# Patient Record
Sex: Male | Born: 1985 | Race: White | Hispanic: No | Marital: Married | Smoking: Former smoker
Health system: Southern US, Community
[De-identification: ages and names within clinical notes are randomized; demographics above are authoritative.]

## PROBLEM LIST (undated history)

## (undated) DIAGNOSIS — N200 Calculus of kidney: Secondary | ICD-10-CM

## (undated) DIAGNOSIS — T7840XA Allergy, unspecified, initial encounter: Secondary | ICD-10-CM

## (undated) DIAGNOSIS — R5383 Other fatigue: Secondary | ICD-10-CM

## (undated) DIAGNOSIS — K219 Gastro-esophageal reflux disease without esophagitis: Secondary | ICD-10-CM

## (undated) DIAGNOSIS — F32A Depression, unspecified: Secondary | ICD-10-CM

## (undated) DIAGNOSIS — Z9109 Other allergy status, other than to drugs and biological substances: Secondary | ICD-10-CM

## (undated) DIAGNOSIS — K5289 Other specified noninfective gastroenteritis and colitis: Secondary | ICD-10-CM

## (undated) DIAGNOSIS — R5381 Other malaise: Secondary | ICD-10-CM

## (undated) DIAGNOSIS — F17200 Nicotine dependence, unspecified, uncomplicated: Secondary | ICD-10-CM

## (undated) DIAGNOSIS — F329 Major depressive disorder, single episode, unspecified: Secondary | ICD-10-CM

## (undated) DIAGNOSIS — I10 Essential (primary) hypertension: Secondary | ICD-10-CM

## (undated) DIAGNOSIS — F410 Panic disorder [episodic paroxysmal anxiety] without agoraphobia: Secondary | ICD-10-CM

## (undated) DIAGNOSIS — G4730 Sleep apnea, unspecified: Secondary | ICD-10-CM

## (undated) HISTORY — DX: Nicotine dependence, unspecified, uncomplicated: F17.200

## (undated) HISTORY — DX: Major depressive disorder, single episode, unspecified: F32.9

## (undated) HISTORY — DX: Calculus of kidney: N20.0

## (undated) HISTORY — PX: OTHER SURGICAL HISTORY: SHX169

## (undated) HISTORY — DX: Other specified noninfective gastroenteritis and colitis: K52.89

## (undated) HISTORY — DX: Allergy, unspecified, initial encounter: T78.40XA

## (undated) HISTORY — DX: Other fatigue: R53.83

## (undated) HISTORY — DX: Depression, unspecified: F32.A

## (undated) HISTORY — DX: Sleep apnea, unspecified: G47.30

## (undated) HISTORY — DX: Other malaise: R53.81

---
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
## 2004-06-05 ENCOUNTER — Emergency Department (HOSPITAL_COMMUNITY): Admission: EM | Admit: 2004-06-05 | Discharge: 2004-06-05 | Payer: Self-pay | Admitting: Emergency Medicine

## 2004-11-12 ENCOUNTER — Ambulatory Visit: Payer: Self-pay | Admitting: Family Medicine

## 2005-07-21 ENCOUNTER — Ambulatory Visit: Payer: Self-pay | Admitting: Family Medicine

## 2005-07-29 ENCOUNTER — Ambulatory Visit: Payer: Self-pay | Admitting: Family Medicine

## 2005-10-23 ENCOUNTER — Ambulatory Visit: Payer: Self-pay | Admitting: Family Medicine

## 2006-04-06 ENCOUNTER — Ambulatory Visit: Payer: Self-pay | Admitting: Family Medicine

## 2007-04-27 ENCOUNTER — Ambulatory Visit: Payer: Self-pay | Admitting: Family Medicine

## 2007-04-27 DIAGNOSIS — R4589 Other symptoms and signs involving emotional state: Secondary | ICD-10-CM | POA: Insufficient documentation

## 2007-04-27 DIAGNOSIS — F329 Major depressive disorder, single episode, unspecified: Secondary | ICD-10-CM

## 2007-05-31 ENCOUNTER — Ambulatory Visit: Payer: Self-pay | Admitting: Internal Medicine

## 2007-09-06 ENCOUNTER — Ambulatory Visit: Payer: Self-pay | Admitting: Family Medicine

## 2007-12-21 ENCOUNTER — Ambulatory Visit: Payer: Self-pay | Admitting: Family Medicine

## 2008-01-24 ENCOUNTER — Ambulatory Visit: Payer: Self-pay | Admitting: Family Medicine

## 2008-01-25 ENCOUNTER — Encounter (INDEPENDENT_AMBULATORY_CARE_PROVIDER_SITE_OTHER): Payer: Self-pay | Admitting: Internal Medicine

## 2008-01-25 ENCOUNTER — Emergency Department (HOSPITAL_COMMUNITY): Admission: EM | Admit: 2008-01-25 | Discharge: 2008-01-25 | Payer: Self-pay | Admitting: Emergency Medicine

## 2008-01-26 ENCOUNTER — Telehealth (INDEPENDENT_AMBULATORY_CARE_PROVIDER_SITE_OTHER): Payer: Self-pay | Admitting: Internal Medicine

## 2008-02-02 ENCOUNTER — Ambulatory Visit: Payer: Self-pay | Admitting: Family Medicine

## 2008-02-13 ENCOUNTER — Ambulatory Visit: Payer: Self-pay | Admitting: Family Medicine

## 2008-02-13 DIAGNOSIS — J309 Allergic rhinitis, unspecified: Secondary | ICD-10-CM

## 2008-03-15 ENCOUNTER — Ambulatory Visit: Payer: Self-pay | Admitting: Pulmonary Disease

## 2008-03-15 ENCOUNTER — Ambulatory Visit: Payer: Self-pay | Admitting: Family Medicine

## 2008-03-15 ENCOUNTER — Telehealth (INDEPENDENT_AMBULATORY_CARE_PROVIDER_SITE_OTHER): Payer: Self-pay | Admitting: Internal Medicine

## 2008-03-15 DIAGNOSIS — G4710 Hypersomnia, unspecified: Secondary | ICD-10-CM | POA: Insufficient documentation

## 2008-03-18 ENCOUNTER — Ambulatory Visit (HOSPITAL_BASED_OUTPATIENT_CLINIC_OR_DEPARTMENT_OTHER): Admission: RE | Admit: 2008-03-18 | Discharge: 2008-03-18 | Payer: Self-pay | Admitting: Pulmonary Disease

## 2008-03-23 ENCOUNTER — Telehealth: Payer: Self-pay | Admitting: Pulmonary Disease

## 2008-03-23 ENCOUNTER — Ambulatory Visit: Payer: Self-pay | Admitting: Pulmonary Disease

## 2008-03-29 ENCOUNTER — Encounter (INDEPENDENT_AMBULATORY_CARE_PROVIDER_SITE_OTHER): Payer: Self-pay | Admitting: Internal Medicine

## 2008-04-03 ENCOUNTER — Ambulatory Visit (HOSPITAL_BASED_OUTPATIENT_CLINIC_OR_DEPARTMENT_OTHER): Admission: RE | Admit: 2008-04-03 | Discharge: 2008-04-03 | Payer: Self-pay | Admitting: Pulmonary Disease

## 2008-04-05 ENCOUNTER — Ambulatory Visit: Payer: Self-pay | Admitting: Pulmonary Disease

## 2008-04-12 ENCOUNTER — Ambulatory Visit: Payer: Self-pay | Admitting: Pulmonary Disease

## 2008-07-31 ENCOUNTER — Ambulatory Visit: Payer: Self-pay | Admitting: Family Medicine

## 2008-07-31 DIAGNOSIS — J111 Influenza due to unidentified influenza virus with other respiratory manifestations: Secondary | ICD-10-CM | POA: Insufficient documentation

## 2008-10-03 ENCOUNTER — Ambulatory Visit: Payer: Self-pay | Admitting: Family Medicine

## 2008-10-03 LAB — CONVERTED CEMR LAB: Rapid Strep: NEGATIVE

## 2008-11-14 ENCOUNTER — Telehealth (INDEPENDENT_AMBULATORY_CARE_PROVIDER_SITE_OTHER): Payer: Self-pay | Admitting: Internal Medicine

## 2010-03-12 ENCOUNTER — Ambulatory Visit: Payer: Self-pay | Admitting: Internal Medicine

## 2010-03-12 ENCOUNTER — Ambulatory Visit: Payer: Self-pay | Admitting: Family Medicine

## 2010-03-12 ENCOUNTER — Telehealth: Payer: Self-pay | Admitting: Family Medicine

## 2010-03-12 DIAGNOSIS — R109 Unspecified abdominal pain: Secondary | ICD-10-CM

## 2010-03-12 DIAGNOSIS — R3129 Other microscopic hematuria: Secondary | ICD-10-CM | POA: Insufficient documentation

## 2010-03-12 DIAGNOSIS — Z87442 Personal history of urinary calculi: Secondary | ICD-10-CM

## 2010-03-12 LAB — CONVERTED CEMR LAB
Bilirubin Urine: NEGATIVE
Casts: 0 /lpf
Glucose, Urine, Semiquant: NEGATIVE
Ketones, urine, test strip: NEGATIVE
Nitrite: NEGATIVE
Specific Gravity, Urine: 1.02
Urine crystals, microscopic: 0 /hpf
Urobilinogen, UA: 0.2
Yeast, UA: 0
pH: 5.5

## 2010-03-13 ENCOUNTER — Encounter: Payer: Self-pay | Admitting: Family Medicine

## 2010-03-27 ENCOUNTER — Encounter: Payer: Self-pay | Admitting: Family Medicine

## 2010-04-10 ENCOUNTER — Encounter: Payer: Self-pay | Admitting: Family Medicine

## 2010-10-20 ENCOUNTER — Emergency Department (HOSPITAL_COMMUNITY)
Admission: EM | Admit: 2010-10-20 | Discharge: 2010-10-20 | Payer: Self-pay | Source: Home / Self Care | Admitting: Family Medicine

## 2010-11-18 NOTE — Progress Notes (Signed)
Summary: Called CT report  Phone Note From Other Clinic   Caller: Rose from CT Call For: Dr Milinda Antis Summary of Call: Pt has 4ml stone in left ureter. Pt is going home and drinking alot of water as instructed.Please advise.  Initial call taken by: Lewanda Rife LPN,  Mar 12, 2010 2:24 PM  Follow-up for Phone Call        please let pt know he does have a kidney stone -- is on the L interestingly -- as most of his pain was on the R -- but I still think this is causing his back pain  is in proximal (early) ureter-- so has a way to go to pass it  use urine screen and update me if he catches anything I am going to go ahead and ref to a urologist as well will do ref and route to [facility] Follow-up by: Judith Part MD,  Mar 12, 2010 2:45 PM  Additional Follow-up for Phone Call Additional follow up Details #1::        Patient notified as instructed by telephone. Pt has already spoken with Shirlee Limerick and has appt in AM with urologist.[facility] LPN  Mar 12, 2010 5:11 PM   New Problems: RENAL CALCULUS (ICD-592.0)   New Problems: RENAL CALCULUS (ICD-592.0)

## 2010-11-18 NOTE — Assessment & Plan Note (Signed)
Summary: DEPRESSION/CLE  Medications Added ALLEGRA-D 12 HOUR 60-120 MG TB12 (FEXOFENADINE-PSEUDOEPHEDRINE)  PROZAC 20 MG  CAPS (FLUOXETINE HCL) 1 qd      Allergies Added: NKDA  Vital Signs:  Patient Profile:   25 Years Old Male Weight:      153 pounds Temp:     98.6 degrees F oral Pulse rate:   83 / minute BP sitting:   122 / 97  (left arm) Cuff size:   regular  Vitals Entered By: Cooper Render (April 27, 2007 12:19 PM)               Chief Complaint:  Depression.  History of Present Illness: Here withmother because of an acute depression.  He and girlfriend broke up 7/6 and he has been depressesd since, indicates he has been depressed for2+ mo.  Has been in bed mosto f the time, not eating and not sleeping.  Has no suicidal or homocidal ideation.  Has not been lto work this wk and is in Product/process development scientist of loosing job.  Mother called his work this AM.  Is in agreement to go to [facility] for eval for their out patient program forl depression.  No previous hx of depression.  Current Allergies: No known allergies      Review of Systems      See HPI   Physical Exam  General:     alert, well-developed, well-nourished, and well-hydrated.  NAD Head:     normocephalic.   Eyes:     no injection.   Lungs:     normal respiratory effort.   Neurologic:     alert & oriented X3 and gait normal.   Skin:     turgor normal and color normal.   Psych:     normally interactive, not anxious appearing, depressed affect, and flat affect.      Impression & Recommendations:  Problem # 1:  DEPRESSION (ICD-311) Assessment: Unchanged  His updated medication list for this problem includes:    Prozac 20 Mg Caps (Fluoxetine hcl) .Marland Kitchen... 1 once daily   Anticipate will need 3-40mo only Referred to [facility] for eval for theit OP depression program See back 1 mo  Medications Added to Medication List This Visit: 1)  Allegra-d 12 Hour 60-120 Mg Tb12  (Fexofenadine-pseudoephedrine) 2)  Prozac 20 Mg Caps (Fluoxetine hcl) .Marland Kitchen.. 1 qd   Patient Instructions: 1)  Please schedule a follow-up appointment in 1 month. 2)  Reviewed plan with patient and mo--agree    Prescriptions: PROZAC 20 MG  CAPS (FLUOXETINE HCL) 1 qd  #30 x 1   Entered and Authorized by:   Gildardo Griffes FNP   Signed by:   Gildardo Griffes FNP on 04/27/2007   Method used:   Print then Give to Patient   RxID:   [id number]

## 2010-11-18 NOTE — Assessment & Plan Note (Signed)
Summary: BACK PAIN SEVERE ACUTE ONLY/DLO   Vital Signs:  Patient profile:   25 year old male Height:      68 inches Weight:      192 pounds BMI:     29.30 Temp:     98.4 degrees F oral Pulse rate:   80 / minute Pulse rhythm:   regular BP sitting:   128 / 84  (left arm) Cuff size:   regular  Vitals Entered By: Lewanda Rife LPN (Mar 12, 2010 11:52 AM) CC: on 03/10/10 severe lower back pain on rt side started. last night pain went down both legs. Pain scale now 5.   History of Present Illness: pain in R side of low back radiates to flank rad to legs last night   no position makes it better or worse  was uncomfortale  no blood in urine   pain is 5/10 right now  was 10/10 this am  took advil for a headache earlier    no hx of kidney stones  does not drink enough water in general - sweats a lot at work   no injury known no hx of back pain  no nausea    Allergies: 1)  ! Chantix Starting Month Pak (Varenicline Tartrate)  Past History:  Past Medical History: Last updated: 03/15/2008  Current Problems:  GASTROENTERITIS (ICD-558.9) SLEEP APNEA (ICD-780.57) ALLERGIC RHINITIS (ICD-477.9) WEAKNESS (ICD-780.79) TOBACCO ABUSE (ICD-305.1) DEPRESSION (ICD-311)  Past Surgical History: Last updated: 03/15/2008 none  Family History: Last updated: 03/15/2008 Grandmother - heart disease, diabetes Mother - asthma  Social History: Last updated: 03/15/2008 Mom smokes.  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  Risk Factors: Alcohol Use: 1 (10/03/2008) Caffeine Use: 5+ (10/03/2008) Exercise: no (10/03/2008)  Risk Factors: Smoking Status: quit (03/15/2008) Packs/Day: 1+ (12/21/2007) Passive Smoke Exposure: yes (03/15/2008)  Review of Systems General:  Complains of fatigue; denies chills, fever, loss of appetite, and malaise. Eyes:  Denies blurring. CV:  Denies chest pain or discomfort, palpitations, and shortness of breath with exertion. Resp:  Denies  cough, shortness of breath, and wheezing. GI:  Complains of abdominal pain; denies bloody stools, change in bowel habits, indigestion, and vomiting. GU:  Denies hematuria and urinary frequency; did have darker urine than usual last week. MS:  Complains of low back pain and mid back pain. Derm:  Denies lesion(s), poor wound healing, and rash. Neuro:  Denies numbness, tingling, and weakness. Psych:  Denies anxiety and depression. Heme:  Denies abnormal bruising and bleeding.  Physical Exam  General:  uncomfortable appearing male  Head:  normocephalic, atraumatic, and no abnormalities observed.   Eyes:  vision grossly intact, pupils equal, pupils round, and pupils reactive to light.  no conj pallor or icterus Mouth:  pharynx pink and moist.   Neck:  No deformities, masses, or tenderness noted. Lungs:  Normal respiratory effort, chest expands symmetrically. Lungs are clear to auscultation, no crackles or wheezes. Heart:  Normal rate and regular rhythm. S1 and S2 normal without gallop, murmur, click, rub or other extra sounds. Abdomen:  tender in R pelvic and suprapubic areas and flank no rebound or gaurding soft, normal bowel sounds, no distention, and no masses.   Msk:  tender R cva  tender R flank some lower LS tenderness neg slr rot R hip worsens pain  LS flex 90 deg/ ext 10 deg- some discomfort  Extremities:  No clubbing, cyanosis, edema, or deformity noted with normal full range of motion of all joints.  Neurologic:  strength normal in all extremities, gait normal, and DTRs symmetrical and normal.   Skin:  Intact without suspicious lesions or rashes Cervical Nodes:  No lymphadenopathy noted Inguinal Nodes:  No significant adenopathy Psych:  normal affect, talkative and pleasant    Impression & Recommendations:  Problem # 1:  FLANK PAIN, RIGHT (ICD-789.09) Assessment New severe flank pain with substantial micro hematuria  stongly suspect kidney stone  sent for Ct without  contrast  urine screen  vicodin for pain  handout on kidney stones from [facility] update asap if worse or prob urinating cx urine  His updated medication list for this problem includes:    Vicodin 5-500 Mg Tabs (Hydrocodone-acetaminophen) .Marland Kitchen... 1-2 by mouth up to every 4-6 hours as needed severe pain caution of sedation  Orders: T-Culture, Urine ([id number]) Specimen Handling (14782) Radiology Referral (Radiology) UA Dipstick W/ Micro (manual) (81000)  Complete Medication List: 1)  Albuterol 90 Mcg/act Aers (Albuterol) .... As needed 2)  Nasonex 50 Mcg/act Susp (Mometasone furoate) .... As directed 3)  Allegra-d 12 Hour 60-120 Mg Xr12h-tab (Fexofenadine-pseudoephedrine) .Marland Kitchen.. 1 two times a day as needed congestion/allergies by mouth 4)  Vicodin 5-500 Mg Tabs (Hydrocodone-acetaminophen) .Marland Kitchen.. 1-2 by mouth up to every 4-6 hours as needed severe pain caution of sedation  Patient Instructions: 1)  you may have a kidney stone  2)  we will send you for CT scan at check out  3)  please urinate through urine screen  4)  I am also sending your urine for culture  5)  drink lots of water  6)  update me if pain worsens  7)  use vicodin for pain with caution -- it can make you sleepy or loopy  8)  I will update you when results come back  Prescriptions: VICODIN 5-500 MG TABS (HYDROCODONE-ACETAMINOPHEN) 1-2 by mouth up to every 4-6 hours as needed severe pain caution of sedation  #20 x 0   Entered and Authorized by:   Judith Part MD   Signed by:   Judith Part MD on 03/12/2010   Method used:   Print then Give to Patient   RxID:   [id number]   Current Allergies (reviewed today): ! CHANTIX STARTING MONTH PAK (VARENICLINE TARTRATE)  Laboratory Results   Urine Tests  Date/Time Received: Mar 12, 2010 11:54 AM  Date/Time Reported: Mar 12, 2010 11:54 AM   Routine Urinalysis   Color: straw Appearance: Cloudy Glucose: negative   (Normal Range: Negative) Bilirubin: negative    (Normal Range: Negative) Ketone: negative   (Normal Range: Negative) Spec. Gravity: 1.020   (Normal Range: 1.003-1.035) Blood: large   (Normal Range: Negative) pH: 5.5   (Normal Range: 5.0-8.0) Protein: trace   (Normal Range: Negative) Urobilinogen: 0.2   (Normal Range: 0-1) Nitrite: negative   (Normal Range: Negative) Leukocyte Esterace: trace   (Normal Range: Negative)  Urine Microscopic WBC/HPF: 0-1 RBC/HPF: TMT Bacteria/HPF: few Mucous/HPF: few Epithelial/HPF: 0-1 Crystals/HPF: 0 Casts/LPF: 0 Yeast/HPF: 0 Other: 0

## 2010-11-18 NOTE — Letter (Signed)
Summary: Out of Work  [employer] at [facility]  [address] [place], [place] [address]   Phone: [phone]  Fax: [phone]    Mar 12, 2010   Employee:  Donald Merritt    To Whom It May Concern:   For Medical reasons, please excuse the above named employee from work for the following dates:  Start:   03/10/2010  End:   03/14/2010 if he is feeling better   If you need additional information, please feel free to contact our office.         Sincerely,    Judith Part MD

## 2010-11-18 NOTE — Letter (Signed)
Summary: [facility]   Imported By: Lanelle Bal 04/17/2010 10:43:41  _____________________________________________________________________  External Attachment:    Type:   Image     Comment:   External Document

## 2010-11-18 NOTE — Consult Note (Signed)
Summary: [facility]  [facility]   Imported By: Lanelle Bal 03/24/2010 08:31:21  _____________________________________________________________________  External Attachment:    Type:   Image     Comment:   External Document

## 2010-11-18 NOTE — Consult Note (Signed)
Summary: [facility]  [facility]   Imported By: Lanelle Bal 04/03/2010 13:16:43  _____________________________________________________________________  External Attachment:    Type:   Image     Comment:   External Document

## 2010-12-29 LAB — POCT RAPID STREP A (OFFICE): Streptococcus, Group A Screen (Direct): NEGATIVE

## 2011-03-03 NOTE — Procedures (Signed)
NAME:  Donald Merritt, Donald Merritt               ACCOUNT NO.:  [id number]   MEDICAL RECORD NO.:  [id number]          PATIENT TYPE:  OUT   LOCATION:  SLEEP CENTER                 FACILITY:  [facility]   PHYSICIAN:  Oretha Milch, MD      DATE OF BIRTH:  Feb 12, 1986   DATE OF STUDY:                            NOCTURNAL POLYSOMNOGRAM   REFERRING PHYSICIAN:   INDICATION FOR STUDY:  Excessive daytime somnolence in this 25 year old  gentleman with BMI 27, weight 180 pounds, height 5 feet 9 inches, neck  size 15 inches.  He did not describe any history of cataplexy, sleep  paralysis or parasomnias.   Overnight polysomnogram was performed with the sleep technologist in  attendance.  EEG, EOG, EMG, EKG and respiratory parameters were  recorded.  Sleep stages, arousals and respiratory data were recorded  according to criteria laid out by the American Academy of Sleep  Medicine.   EPWORTH SLEEPINESS SCORE:  11/24.   SLEEP ARCHITECTURE:  Lights out was at 9:15 p.m.  Total sleep time was  322 minutes with a sleep period time of 356 minutes leading to a sleep  efficiency of 70.2%.  Sleep latency was 102.5 minutes with awake after  sleep onset of 34 minutes.  Latency to REM sleep was 191 minutes.  Sleep  stages of the person is a total sleep time was N1 5%, N2 66.8%, N3 8.7%,  and REM sleep 19.6% (63 minutes).  He spent 121 minutes in the supine  position in non-REM sleep.  No supine REM sleep was noted.  Good REM  progression was observed with the longest period of REM sleep around 4  a.m.   AROUSAL DATA:  There were a total of 64 arousals with an arousal index  of 11.9 events per hour of these 62 were spontaneous and 2 were  associated with limb movement.   RESPIRATORY DATA:  There was 1 central apnea, zero obstructive apnea,  zero mixed apneas and zero hypopneas during the sleep period with an AHI  of 0.2 events per hour, 5 RERAs were observed with an LD of 1.1 events  per hour.  Central apnea lasted for 14  seconds.   LIMB MOVEMENT DATA:  No significant limb movements were observed.   OXYGEN SATURATION DATA:  There were no significant desaturations noted.  The lowest desaturation was 93% during non-REM sleep.   CARDIAC DATA:  The lowest heart rate was 43 beats per minute.  No  cardiac arrhythmias were noted.   DISCUSSION:  Prolonged sleep latency was noted.  This may have been  related to a phone call that the patient obtained prior to the study  telling him that his fiancee was in the hospital.  Good REM progression  was noted with a prolonged REM latency.   IMPRESSION:  1. No evidence of sleep disordered breathing.  2. No evidence of cardiac arrhythmias, limb movements or behavioral      disturbance during sleep.  3. The differential diagnosis of excessive daytime somnolence based on      this sleep study includes insufficient sleep syndrome and      narcolepsy.  The absence of  a history of cataplexy, sleep paralysis      and the prolonged latency to REM sleep does not favor narcolepsy.   RECOMMENDATIONS:  Proceed with multiple sleep latency testing for  objective evidence of hypersomnolence and to look for sleep onset REM.      Oretha Milch, MD  Electronically Signed     RVA/MEDQ  D:  03/23/2008 13:23:33  T:  03/23/2008 15:10:10  Job:  [id number]

## 2011-03-03 NOTE — Procedures (Signed)
NAME:  Donald Merritt, Donald Merritt NO.:  [id number]   MEDICAL RECORD NO.:  [id number]          PATIENT TYPE:  OUT   LOCATION:  SLEEP CENTER                 FACILITY:  [facility]   PHYSICIAN:  Oretha Milch, MD      DATE OF BIRTH:  1985-12-29   DATE OF STUDY:  04/03/2008                        MAINTENANCE OF WAKEFULNESS TEST   REFERRING PHYSICIAN:   INDICATION FOR STUDY:  [name] is a 25 year old gentleman with  excessive daytime somnolence and fatigue and a nondiagnostic overnight  polysomnogram.  He weighs 180 pounds with a BMI of 27, height 5 feet 9  inches.  Neck size 15 inches and Epworth sleepiness score 12/24.   This multiple sleep latency testing was performed with a sleep  technologist in attendance.  EEG, EOG, EMG, EKG and respiratory data was  recorded.  Sleep stages arousals were scored according to criteria laid  out by the American Academy of Sleep Medicine.   EPWORTH SLEEPINESS SCORE:  12/24.   BMI:  He weighs 180 pounds with a BMI of 27, height 5 feet 9 inches.  Neck size 15 inches.   MEDICATIONS:  none   SLEEP HISTORY:  His bedtime is 9 or 10 p.m..  Wake up time is 6:30 a.m.,  with a time in bed of 8-10 hours.  There is one to two awakenings and  describes a 15-30 minutes sleep latency.   Starting at 8 a.m. five naps were recorded.  Sleep was noted on naps #2  and 4.   NAP 1:  No sleep was obtained.   NAP 2:  Lights off was at 10:10 a.m., sleep latency was 7 minutes, no  REM sleep was obtained.   NAP 3:  No sleep was obtained.   NAP 4:  Lights off was at 1410 p.m., sleep latency was 9 minutes.   NAP 5:  No sleep was obtained.    MEAN SLEEP LATENCY:  15.2 mins   COMMENTS:   IMPRESSIONS-RECOMMENDATIONS:  1. Average sleep latency was 15.2 minutes.  2. Sleep was noted on 2/5 naps.  No sleep onset REM was noted.  This      MSLT is not diagnostic of narcolepsy.  In view of his excessive      daytime somnolence and overnight polysomnogram which did  not show      any evidence of sleep disordered breathing or periodic limb      movements and a sleep history that seems to indicate sufficient      sleep time (8-10 hours) we should consider etiopathic      hypersomnolence as a possible diagnosis.  I also note that he does      not provide any history of cataplexy or sleep paralysis.   RECOMMENDATIONS:  1. Consider prolonging sleep time to 9 hours every night.  2. If the somnolence is persistent and interfering with his work,      consider trial of Provigil.      Oretha Milch, MD  Electronically Signed     RVA/MEDQ  D:  04/05/2008 11:46:41  T:  04/05/2008 13:16:28  Job:  [id number]

## 2011-05-30 ENCOUNTER — Emergency Department (HOSPITAL_COMMUNITY)
Admission: EM | Admit: 2011-05-30 | Discharge: 2011-05-31 | Disposition: A | Discharge status: Home / Self Care | Payer: Self-pay | Attending: Emergency Medicine | Admitting: Emergency Medicine

## 2011-05-30 DIAGNOSIS — F329 Major depressive disorder, single episode, unspecified: Secondary | ICD-10-CM | POA: Insufficient documentation

## 2011-05-30 DIAGNOSIS — F3289 Other specified depressive episodes: Secondary | ICD-10-CM | POA: Insufficient documentation

## 2011-05-30 LAB — COMPREHENSIVE METABOLIC PANEL
ALT: 25 U/L (ref 0–53)
AST: 21 U/L (ref 0–37)
Albumin: 4.2 g/dL (ref 3.5–5.2)
Alkaline Phosphatase: 113 U/L (ref 39–117)
BUN: 12 mg/dL (ref 6–23)
CO2: 27 mEq/L (ref 19–32)
Chloride: 102 mEq/L (ref 96–112)
Creatinine, Ser: 0.95 mg/dL (ref 0.50–1.35)
GFR calc Af Amer: >60 mL/min (ref >60)
Glucose, Bld: 106 mg/dL — ABNORMAL HIGH (ref 70–99)
Potassium: 3.7 mEq/L (ref 3.5–5.1)
Sodium: 138 mEq/L (ref 135–145)
Total Bilirubin: 0.4 mg/dL (ref 0.3–1.2)

## 2011-05-30 LAB — RAPID URINE DRUG SCREEN, HOSP PERFORMED
Amphetamines: NOT DETECTED
Barbiturates: NOT DETECTED
Benzodiazepines: NOT DETECTED
Cocaine: NOT DETECTED
Opiates: NOT DETECTED
Tetrahydrocannabinol: NOT DETECTED

## 2011-05-30 LAB — DIFFERENTIAL
Basophils Relative: 0 % (ref 0–1)
Eosinophils Relative: 7 % — ABNORMAL HIGH (ref 0–5)
Lymphocytes Relative: 33 % (ref 12–46)
Monocytes Absolute: 0.6 10*3/uL (ref 0.1–1.0)
Monocytes Relative: 6 % (ref 3–12)
Neutro Abs: 5 10*3/uL (ref 1.7–7.7)
Neutrophils Relative %: 53 % (ref 43–77)

## 2011-05-30 LAB — CBC
HCT: 46.7 % (ref 39.0–52.0)
Hemoglobin: 16.6 g/dL (ref 13.0–17.0)
MCH: 29.9 pg (ref 26.0–34.0)
MCHC: 35.5 g/dL (ref 30.0–36.0)
MCV: 84 fL (ref 78.0–100.0)
Platelets: 219 10*3/uL (ref 150–400)
RDW: 12.5 % (ref 11.5–15.5)
WBC: 9.4 10*3/uL (ref 4.0–10.5)

## 2011-07-14 LAB — URINALYSIS, ROUTINE W REFLEX MICROSCOPIC
Bilirubin Urine: NEGATIVE
Glucose, UA: NEGATIVE
Hgb urine dipstick: NEGATIVE
Ketones, ur: NEGATIVE
Nitrite: NEGATIVE
Protein, ur: NEGATIVE
Specific Gravity, Urine: 1.006
Urobilinogen, UA: 0.2
pH: 7.5

## 2011-07-14 LAB — DIFFERENTIAL
Basophils Absolute: 0
Basophils Relative: 0
Eosinophils Absolute: 0.1
Eosinophils Relative: 0
Lymphocytes Relative: 9 — ABNORMAL LOW
Lymphs Abs: 1.5
Monocytes Absolute: 0.7
Monocytes Relative: 4
Neutro Abs: 13.7 — ABNORMAL HIGH
Neutrophils Relative %: 86 — ABNORMAL HIGH

## 2011-07-14 LAB — CBC
HCT: 47.1
Hemoglobin: 16.2
MCHC: 34.4
MCV: 86.8
Platelets: 203
RBC: 5.42
RDW: 12.4
WBC: 16 — ABNORMAL HIGH

## 2011-07-14 LAB — POCT CARDIAC MARKERS
CKMB, poc: <1 — ABNORMAL LOW
Myoglobin, poc: 43
Operator id: 294521
Troponin i, poc: <0.05

## 2011-07-14 LAB — POCT I-STAT, CHEM 8
BUN: 14
Calcium, Ion: 1.17
Chloride: 105
Creatinine, Ser: 1.2
Glucose, Bld: 97
HCT: 48
Hemoglobin: 16.3
Potassium: 4.7
Sodium: 139
TCO2: 26

## 2011-07-14 LAB — HEPATIC FUNCTION PANEL
ALT: 27
AST: 29
Albumin: 4.2
Alkaline Phosphatase: 88
Bilirubin, Direct: 0.2
Indirect Bilirubin: 0.5
Total Bilirubin: 0.7
Total Protein: 6.8

## 2011-07-14 LAB — LIPASE, BLOOD: Lipase: 20

## 2011-09-01 ENCOUNTER — Other Ambulatory Visit: Payer: Self-pay

## 2011-09-01 ENCOUNTER — Encounter: Payer: Self-pay | Admitting: *Deleted

## 2011-09-01 ENCOUNTER — Emergency Department (HOSPITAL_COMMUNITY)
Admission: EM | Admit: 2011-09-01 | Discharge: 2011-09-02 | Discharge status: Against Medical Advice | Payer: BC Managed Care – PPO | Attending: Emergency Medicine | Admitting: Emergency Medicine

## 2011-09-01 DIAGNOSIS — R079 Chest pain, unspecified: Secondary | ICD-10-CM | POA: Insufficient documentation

## 2011-09-01 HISTORY — DX: Panic disorder (episodic paroxysmal anxiety): F41.0

## 2011-09-01 HISTORY — DX: Other allergy status, other than to drugs and biological substances: Z91.09

## 2011-09-01 NOTE — ED Notes (Signed)
Quit smoking Saturday, has had head cold, taking mucinex, c/o chest fluttering, spaced out, "feel high", some nausea & sob. Onset of sx around 1600, last mucinex at 0600.

## 2011-09-02 ENCOUNTER — Encounter (HOSPITAL_COMMUNITY): Payer: Self-pay | Admitting: *Deleted

## 2011-09-02 ENCOUNTER — Emergency Department (HOSPITAL_COMMUNITY): Payer: BC Managed Care – PPO

## 2011-09-02 ENCOUNTER — Emergency Department (HOSPITAL_COMMUNITY)
Admission: EM | Admit: 2011-09-02 | Discharge: 2011-09-02 | Disposition: A | Discharge status: Home / Self Care | Payer: BC Managed Care – PPO | Attending: Emergency Medicine | Admitting: Emergency Medicine

## 2011-09-02 ENCOUNTER — Other Ambulatory Visit: Payer: Self-pay

## 2011-09-02 DIAGNOSIS — R079 Chest pain, unspecified: Secondary | ICD-10-CM | POA: Insufficient documentation

## 2011-09-02 DIAGNOSIS — Z7982 Long term (current) use of aspirin: Secondary | ICD-10-CM | POA: Insufficient documentation

## 2011-09-02 DIAGNOSIS — R05 Cough: Secondary | ICD-10-CM | POA: Insufficient documentation

## 2011-09-02 DIAGNOSIS — K209 Esophagitis, unspecified without bleeding: Secondary | ICD-10-CM | POA: Insufficient documentation

## 2011-09-02 DIAGNOSIS — Z79899 Other long term (current) drug therapy: Secondary | ICD-10-CM | POA: Insufficient documentation

## 2011-09-02 DIAGNOSIS — R070 Pain in throat: Secondary | ICD-10-CM | POA: Insufficient documentation

## 2011-09-02 DIAGNOSIS — R059 Cough, unspecified: Secondary | ICD-10-CM | POA: Insufficient documentation

## 2011-09-02 DIAGNOSIS — R1013 Epigastric pain: Secondary | ICD-10-CM | POA: Insufficient documentation

## 2011-09-02 DIAGNOSIS — R42 Dizziness and giddiness: Secondary | ICD-10-CM | POA: Insufficient documentation

## 2011-09-02 DIAGNOSIS — R209 Unspecified disturbances of skin sensation: Secondary | ICD-10-CM | POA: Insufficient documentation

## 2011-09-02 LAB — URINALYSIS, ROUTINE W REFLEX MICROSCOPIC
Glucose, UA: NEGATIVE mg/dL
Ketones, ur: NEGATIVE mg/dL
Leukocytes, UA: NEGATIVE
pH: 7.5 (ref 5.0–8.0)

## 2011-09-02 LAB — COMPREHENSIVE METABOLIC PANEL
Albumin: 4 g/dL (ref 3.5–5.2)
Alkaline Phosphatase: 115 U/L (ref 39–117)
BUN: 15 mg/dL (ref 6–23)
Potassium: 3.9 mEq/L (ref 3.5–5.1)
Sodium: 137 mEq/L (ref 135–145)
Total Protein: 7.3 g/dL (ref 6.0–8.3)

## 2011-09-02 LAB — DIFFERENTIAL
Basophils Absolute: 0 10*3/uL (ref 0.0–0.1)
Basophils Relative: 0 % (ref 0–1)
Eosinophils Absolute: 0.3 10*3/uL (ref 0.0–0.7)
Monocytes Relative: 6 % (ref 3–12)
Neutrophils Relative %: 67 % (ref 43–77)

## 2011-09-02 LAB — D-DIMER, QUANTITATIVE: D-Dimer, Quant: 0.23 ug/mL-FEU (ref 0.00–0.48)

## 2011-09-02 LAB — CBC
MCH: 30.3 pg (ref 26.0–34.0)
MCHC: 36.2 g/dL — ABNORMAL HIGH (ref 30.0–36.0)
Platelets: 224 10*3/uL (ref 150–400)
RDW: 12.1 % (ref 11.5–15.5)

## 2011-09-02 LAB — POCT I-STAT TROPONIN I: Troponin i, poc: 0 ng/mL (ref 0.00–0.08)

## 2011-09-02 MED ORDER — PANTOPRAZOLE SODIUM 40 MG IV SOLR
40.0000 mg | Freq: Once | INTRAVENOUS | Status: AC
  Administered 2011-09-02: 40 mg via INTRAVENOUS
  Filled 2011-09-02: qty 40

## 2011-09-02 MED ORDER — GI COCKTAIL ~~LOC~~
30.0000 mL | Freq: Once | ORAL | Status: AC
  Administered 2011-09-02: 30 mL via ORAL
  Filled 2011-09-02: qty 30

## 2011-09-02 MED ORDER — HYDROCODONE-ACETAMINOPHEN 5-325 MG PO TABS: 1.0000 | ORAL_TABLET | ORAL | Status: AC | PRN

## 2011-09-02 MED ORDER — PANTOPRAZOLE SODIUM 20 MG PO TBEC: DELAYED_RELEASE_TABLET | ORAL | Status: DC

## 2011-09-02 NOTE — ED Notes (Signed)
Pt reports he feels "a little better".  Requesting Coke so he "can burp".  Wife at bedside.

## 2011-09-02 NOTE — ED Notes (Signed)
Pt having burning chest pain since yesterday, left ama yesterday. Reports having diarrhea also.

## 2011-09-02 NOTE — ED Notes (Signed)
Pt alert and oriented, NAD noted.

## 2011-09-02 NOTE — ED Provider Notes (Signed)
History     CSN: [id number] Arrival date & time: 09/02/2011  7:47 AM   First MD Initiated Contact with Patient 09/02/11 [phone]      Chief Complaint  Patient presents with  . Chest Pain    (Consider location/radiation/quality/duration/timing/severity/associated sxs/prior treatment) HPI Comments: Patient is a 25 year old man who says that he's had chest pain, a burning feeling, and facial numbness that started yesterday. He has had a cold and a sore throat for 5 days. He has taken over-the-counter medications such as Mucinex for his cold. He has also had a productive cough. The pain in his left chest as a burning pain. He took some aspirin this morning without relief. He also notes a feeling of being lightheaded. There is no history of heart disease.  Patient is a 25 y.o. male presenting with chest pain.  Chest Pain The chest pain began yesterday. Episode Length: He feels a constant burning pain in the epigastric region and left chest. Chest pain occurs constantly. The chest pain is unchanged. At its most intense, the pain is at 8/10. The pain is currently at 8/10. The severity of the pain is moderate. The quality of the pain is described as burning. The pain radiates to the epigastrium. Primary symptoms include cough and abdominal pain. Pertinent negatives for primary symptoms include no fever. Primary symptoms comment: Patient has had a recent cold, with sore throat and productive cough. He denies fever. Treatments tried: He has tried Mucinex for his cold, and took aspirin this morning for the chest pain. Risk factors include smoking/tobacco exposure and sedentary lifestyle.  His family medical history is significant for heart disease in family (his grandmother on his father's side has had coronary artery bypass grafting. His father has hypertension.).     Past Medical History  Diagnosis Date  . Environmental allergies   . Panic attack     History reviewed. No pertinent past surgical  history.  Family History  Problem Relation Age of Onset  . Asthma Mother   . Hypertension Father   . Heart failure Other   . Diabetes Other     History  Substance Use Topics  . Smoking status: Former Smoker -- 1.0 packs/day for 15 years    Types: Cigarettes    Quit date: 08/22/2011  . Smokeless tobacco: Not on file  . Alcohol Use: Yes     occaisionally      Review of Systems  Constitutional: Negative.  Negative for fever.  HENT: Positive for sore throat.   Respiratory: Positive for cough.   Cardiovascular: Positive for chest pain.  Gastrointestinal: Positive for abdominal pain.  Genitourinary: Negative.   Musculoskeletal: Negative.   Skin: Negative.   Neurological: Negative.   Psychiatric/Behavioral: Negative.     Allergies  Varenicline tartrate  Home Medications   Current Outpatient Rx  Name Route Sig Dispense Refill  . ASPIRIN PO Oral Take 2 tablets by mouth once as needed. For pain     . DEXTROMETHORPHAN-GUAIFENESIN 30-600 MG PO TB12 Oral Take 1 tablet by mouth every 12 (twelve) hours.     Marland Kitchen FEXOFENADINE-PSEUDOEPHEDRINE 180-240 MG PO TB24 Oral Take 1 tablet by mouth daily.     Marland Kitchen HYDROCODONE-ACETAMINOPHEN 5-325 MG PO TABS Oral Take 1 tablet by mouth every 4 (four) hours as needed for pain. 20 tablet 0  . PANTOPRAZOLE SODIUM 20 MG PO TBEC  Take one 40 mg tablet once a day, in the morning. 15 tablet 1    BP 115/66  Pulse 76  Temp(Src) 98.7 F (37.1 C) (Oral)  Resp 16  Ht 5\' 9"  (1.753 m)  Wt 195 lb (88.451 kg)  BMI 28.80 kg/m2  SpO2 98%  Physical Exam  Constitutional: He is oriented to person, place, and time. He appears well-developed and well-nourished.       He appears anxious.  HENT:  Head: Normocephalic and atraumatic.  Right Ear: External ear normal.  Left Ear: External ear normal.  Mouth/Throat: Oropharynx is clear and moist.  Eyes: Conjunctivae and EOM are normal. Pupils are equal, round, and reactive to light.  Neck: Normal range of motion.  Neck supple.  Cardiovascular: Normal rate and regular rhythm.   Pulmonary/Chest: Effort normal and breath sounds normal.  Abdominal: Soft.       He has mild epigastric tenderness.  Musculoskeletal: Normal range of motion.  Neurological: He is alert and oriented to person, place, and time. He has normal reflexes.       No sensory or motor deficit.  Skin: Skin is warm and dry.  Psychiatric: He has a normal mood and affect. His behavior is normal.    ED Course  Procedures (including critical care time)  Labs Reviewed  CBC - Abnormal; Notable for the following:    MCHC 36.2 (*)    All other components within normal limits  DIFFERENTIAL  COMPREHENSIVE METABOLIC PANEL  URINALYSIS, ROUTINE W REFLEX MICROSCOPIC  D-DIMER, QUANTITATIVE  POCT I-STAT TROPONIN I  POCT I-STAT TROPONIN I   Dg Chest 2 View  09/02/2011  *RADIOLOGY REPORT*  Clinical Data: Epigastric pain, left-sided chest pain  CHEST - 2 VIEW  Comparison: Chest x-ray of 01/25/2008  Findings: The lungs are clear.  Mediastinal contours appear normal. The heart is within normal limits in size.  No bony abnormality is seen.  IMPRESSION: No active lung disease.  Original Report Authenticated By: Juline Patch, M.D.    Date: 09/02/2011  Rate: 82  Rhythm: normal sinus rhythm and sinus arrhythmia  QRS Axis: normal  Intervals: normal  ST/T Wave abnormalities: normal  Conduction Disutrbances:none  Narrative Interpretation: Normal EKG.  Old EKG Reviewed: unchanged  7:21 PM Patient was seen and had physical examination. EKG was benign. Laboratory tests were ordered. A GI cocktail was ordered.  7:21 PM Patient's blood count, chemistries, troponin I, d-dimer, and chest x-rays were all normal. He continues to complain of a burning pain in his chest and epigastrium. I will try an IV proton asked to see if that could help. We will get a three-hour troponin I.  7:21 PM Lab workup reviewed with pt and his wife.  No evidence of MI.  Will  treat for esophagitis with Protonix, oral antacids, and hydrocodone-acetaminophen if needed for pain.  No work for 3 days.  F/U with [place] at [facility]   1. Esophagitis             Carleene Cooper III, MD 09/02/11 [phone]

## 2011-09-03 ENCOUNTER — Encounter: Payer: Self-pay | Admitting: Family Medicine

## 2011-09-04 ENCOUNTER — Encounter: Payer: Self-pay | Admitting: Family Medicine

## 2011-09-04 ENCOUNTER — Ambulatory Visit (INDEPENDENT_AMBULATORY_CARE_PROVIDER_SITE_OTHER): Payer: BC Managed Care – PPO | Admitting: Family Medicine

## 2011-09-04 VITALS — BP 120/70 | HR 76 | Temp 97.9°F | Ht 69.0 in | Wt 198.0 lb

## 2011-09-04 DIAGNOSIS — Z23 Encounter for immunization: Secondary | ICD-10-CM

## 2011-09-04 DIAGNOSIS — K219 Gastro-esophageal reflux disease without esophagitis: Secondary | ICD-10-CM

## 2011-09-04 DIAGNOSIS — F17200 Nicotine dependence, unspecified, uncomplicated: Secondary | ICD-10-CM

## 2011-09-04 MED ORDER — PANTOPRAZOLE SODIUM 40 MG PO TBEC: 40.0000 mg | DELAYED_RELEASE_TABLET | Freq: Two times a day (BID) | ORAL | Status: DC

## 2011-09-04 NOTE — Assessment & Plan Note (Addendum)
New dx since ER visit Rev ER notes/ studies/ labs in detail with pt  Is improved some with protoinix qd- but not totally - so will inc to bid  Pt inst to cal in 2 weeks to update me about symptoms- if not imp will ref to GI Has quit alcohol/ and diet is better Handouts given inst to call if symptoms worsen  Will decide on f/u after update

## 2011-09-04 NOTE — Assessment & Plan Note (Signed)
Congratulated on quitting!- doing very well now  Disc importance to his health

## 2011-09-04 NOTE — Progress Notes (Signed)
Subjective:    Patient ID: Donald Merritt, male    DOB: January 20, 1986, 25 y.o.   MRN: [id number]  HPI Here for f/u of ER visit for CP (thought to be esophagitis)  He went to ER after viral uri with CP- described as burning in epigastrium Reviewed records with pt today EKG/labs incl cardiac enzymes/ D dimer/ cxr all wnl Given GI cocktail Given px for protonix , also tylenol/ codiene   Now - is improved but not totally gone  Lot of gas - from above and below  No acid in throat  No trouble swallowing  Improved about 20 % from where he was  Can feel stomach bubbling a lot  Stopped sodas - occ ginger ale  No nausea  Staying away from spicy/ acidic/ caffeine No beer in over a week -- was drinking "kind of heavily"-- 3 -6 beers per everning  No withdrawal symptoms   Is also using some mylanta after meals which helps a bit   No blood in stool or black stools    bp ok 129/70  Wt is up 3 lb with bmi of 29  Smoking staus - quit 1 week ago  Feeling ok overall   Patient Active Problem List  Diagnoses  . Former smoker  . HYPERSOMNIA, PERSISTENT  . DEPRESSION  . ALLERGIC RHINITIS  . RENAL CALCULUS  . MICROSCOPIC HEMATURIA  . FLANK PAIN, RIGHT  . GERD (gastroesophageal reflux disease)   Past Medical History  Diagnosis Date  . Environmental allergies   . Panic attack   . Other and unspecified noninfectious gastroenteritis and colitis   . Unspecified sleep apnea   . Other malaise and fatigue   . Tobacco use disorder   . Depression    No past surgical history on file. History  Substance Use Topics  . Smoking status: Former Smoker -- 1.0 packs/day for 15 years    Types: Cigarettes    Quit date: 08/22/2011  . Smokeless tobacco: Not on file  . Alcohol Use: Yes     occaisionally   Family History  Problem Relation Age of Onset  . Asthma Mother   . Hypertension Father   . Heart failure Other   . Diabetes Other    Allergies  Allergen Reactions  . Varenicline Tartrate  Other (See Comments)    Was in here from 2009, but patient is not aware of any allergies.   Current Outpatient Prescriptions on File Prior to Visit  Medication Sig Dispense Refill  . fexofenadine-pseudoephedrine (ALLEGRA-D 24) 180-240 MG per 24 hr tablet Take 1 tablet by mouth daily.       Marland Kitchen HYDROcodone-acetaminophen (NORCO) 5-325 MG per tablet Take 1 tablet by mouth every 4 (four) hours as needed for pain.  20 tablet  0  . dextromethorphan-guaiFENesin (MUCINEX DM) 30-600 MG per 12 hr tablet Take 1 tablet by mouth every 12 (twelve) hours.            Review of Systems Review of Systems  Constitutional: Negative for fever, appetite change, fatigue and unexpected weight change.  Eyes: Negative for pain and visual disturbance.  Respiratory: Negative for cough and shortness of breath.   Cardiovascular: Negative for palpitations pr sob or edema    Gastrointestinal: Negative for nausea, diarrhea and constipation. pos for acid reflux/ heartburn Genitourinary: Negative for urgency and frequency.  Skin: Negative for pallor or rash   Neurological: Negative for weakness, light-headedness, numbness and headaches.  Hematological: Negative for adenopathy. Does not bruise/bleed easily.  Psychiatric/Behavioral: Negative for dysphoric mood. The patient is not nervous/anxious.          Objective:   Physical Exam  Constitutional: He appears well-developed and well-nourished. No distress.  HENT:  Head: Normocephalic and atraumatic.  Mouth/Throat: Oropharynx is clear and moist.  Eyes: Conjunctivae and EOM are normal. Pupils are equal, round, and reactive to light. No scleral icterus.  Neck: Normal range of motion. Neck supple. No JVD present. Carotid bruit is not present. No thyromegaly present.  Cardiovascular: Normal rate, regular rhythm, normal heart sounds and intact distal pulses.   Pulmonary/Chest: Effort normal and breath sounds normal. No respiratory distress. He has no wheezes. He exhibits no  tenderness.  Abdominal: Soft. Bowel sounds are normal. He exhibits no distension, no abdominal bruit and no mass. There is tenderness. There is no rebound and no guarding.       Mild epigastric tenderness   Musculoskeletal: He exhibits no edema and no tenderness.  Lymphadenopathy:    He has no cervical adenopathy.  Neurological: He is alert. He has normal reflexes. He exhibits normal muscle tone. Coordination normal.       No tremor   Skin: Skin is warm and dry. No rash noted. No erythema. No pallor.  Psychiatric: He has a normal mood and affect.          Assessment & Plan:

## 2011-09-04 NOTE — Patient Instructions (Addendum)
Good job with lifestyle change so far  Avoid spicy/ acidic foods and beverages / and carbonation/ caffeine and alcohol Increase protonix to twice daily- am and pm Call in 2 weeks and update me with how you are doing - if you are not much much improved I will refer you to GI specialist to evaluate further  Stay away from aspirin/ ibuprofen/ aleve (anti inflammatories)  Stick with tylenol (acetaminophen) and drink lots of water

## 2011-10-19 ENCOUNTER — Encounter: Payer: Self-pay | Admitting: Family Medicine

## 2011-10-19 ENCOUNTER — Ambulatory Visit (INDEPENDENT_AMBULATORY_CARE_PROVIDER_SITE_OTHER): Payer: BC Managed Care – PPO | Admitting: Family Medicine

## 2011-10-19 VITALS — BP 134/84 | HR 72 | Temp 98.1°F | Wt 194.1 lb

## 2011-10-19 DIAGNOSIS — K219 Gastro-esophageal reflux disease without esophagitis: Secondary | ICD-10-CM

## 2011-10-19 MED ORDER — PANTOPRAZOLE SODIUM 40 MG PO TBEC: 40.0000 mg | DELAYED_RELEASE_TABLET | Freq: Two times a day (BID) | ORAL | Status: DC

## 2011-10-19 NOTE — Progress Notes (Signed)
Subjective:    Patient ID: Donald Merritt, male    DOB: 1986-10-15, 25 y.o.   MRN: [id number]  HPI GERd is flaring back up again -- burning in chest/heartburn and bubbling in stomach Bad this week - moreso  Thought at first coming down with something  Loose stool- not black and no blood Some epigastric burning pain   In past had inc protonix to bid -- relieved most of his symptoms  Ran out and did not get it refilled  No fever or other symptoms  Drinking some more dr pepper -some caffiene Not eating well around the holidays  Has quit drinking - really proud of that (4 beers since last visit)   Started back smoking - not really ready to quit yet - but plans to quit with his wife  No quit date yet   Patient Active Problem List  Diagnoses  . Former smoker  . HYPERSOMNIA, PERSISTENT  . DEPRESSION  . ALLERGIC RHINITIS  . RENAL CALCULUS  . MICROSCOPIC HEMATURIA  . FLANK PAIN, RIGHT  . GERD (gastroesophageal reflux disease)   Past Medical History  Diagnosis Date  . Environmental allergies   . Panic attack   . Other and unspecified noninfectious gastroenteritis and colitis   . Unspecified sleep apnea   . Other malaise and fatigue   . Tobacco use disorder   . Depression    No past surgical history on file. History  Substance Use Topics  . Smoking status: Current Everyday Smoker -- 0.5 packs/day for 15 years    Types: Cigarettes  . Smokeless tobacco: Former Neurosurgeon    Types: Snuff  . Alcohol Use: Yes     occaisionally   Family History  Problem Relation Age of Onset  . Asthma Mother   . Hypertension Father   . Heart failure Other   . Diabetes Other    Allergies  Allergen Reactions  . Varenicline Tartrate Other (See Comments)    Was in here from 2009, but patient is not aware of any allergies.   Current Outpatient Prescriptions on File Prior to Visit  Medication Sig Dispense Refill  . Calcium & Magnesium Carbonates (MYLANTA PO) Take by mouth. After meals and at  bedtime       . dextromethorphan-guaiFENesin (MUCINEX DM) 30-600 MG per 12 hr tablet Take 1 tablet by mouth every 12 (twelve) hours.       . fexofenadine-pseudoephedrine (ALLEGRA-D 24) 180-240 MG per 24 hr tablet Take 1 tablet by mouth daily.       . pantoprazole (PROTONIX) 40 MG tablet Take 1 tablet (40 mg total) by mouth 2 (two) times daily.  30 tablet  1         Review of Systems Review of Systems  Constitutional: Negative for fever, appetite change, fatigue and unexpected weight change.  Eyes: Negative for pain and visual disturbance.  Respiratory: Negative for cough and shortness of breath. Neg for wheeze  Cardiovascular: Negative for cp or palpitations    Gastrointestinal: Negative for nausea, diarrhea and constipation. pos for epigastric pain/ heartburn Genitourinary: Negative for urgency and frequency.  Skin: Negative for pallor or rash   Neurological: Negative for weakness, light-headedness, numbness and headaches.  Hematological: Negative for adenopathy. Does not bruise/bleed easily.  Psychiatric/Behavioral: Negative for dysphoric mood. The patient is not nervous/anxious.          Objective:   Physical Exam  Constitutional: He appears well-developed and well-nourished. No distress.  HENT:  Head: Normocephalic and atraumatic.  Mouth/Throat: Oropharynx is clear and moist.  Eyes: Conjunctivae and EOM are normal. Pupils are equal, round, and reactive to light. No scleral icterus.  Abdominal: Soft. Bowel sounds are normal. He exhibits no distension and no mass. There is tenderness. There is no rebound and no guarding.       Some upper abd tenderness R and epigastric Neg murphy sign No rebound   Neurological: He is alert. He has normal reflexes.  Skin: Skin is warm and dry. No pallor.  Psychiatric: He has a normal mood and affect.          Assessment & Plan:

## 2011-10-19 NOTE — Assessment & Plan Note (Signed)
Was initially improved with bid ppi (failed qd protonix - imp with bid)  After running out of med 2 weeks ago - much worse sympt of gerd and gastritis Disc diet and given handouts Commended on stopping etoh Also adv to quit smoking- almost ready to quit Px protonix 40 bid  F/u 3 mo  If not imp- consider ultrasound , or GI consult -will keep me updated

## 2011-10-19 NOTE — Patient Instructions (Addendum)
Take the protonix 40 mg one pill twice daily for acid reflux and gastritis Watch diet  Work on quitting smoking  If pain on the R side of abdomen does not go away- call (we would check ultrasound for gallstones)  Follow up with me in 3 months  I sent px to the pharmacy

## 2011-12-24 ENCOUNTER — Ambulatory Visit
Admission: RE | Admit: 2011-12-24 | Discharge: 2011-12-24 | Disposition: A | Discharge status: Home / Self Care | Payer: BC Managed Care – PPO | Source: Ambulatory Visit | Attending: Family Medicine | Admitting: Family Medicine

## 2011-12-24 ENCOUNTER — Ambulatory Visit (INDEPENDENT_AMBULATORY_CARE_PROVIDER_SITE_OTHER): Payer: BC Managed Care – PPO | Admitting: Family Medicine

## 2011-12-24 ENCOUNTER — Telehealth: Payer: Self-pay | Admitting: *Deleted

## 2011-12-24 ENCOUNTER — Encounter: Payer: Self-pay | Admitting: Family Medicine

## 2011-12-24 VITALS — BP 120/80 | HR 100 | Temp 97.8°F | Wt 187.0 lb

## 2011-12-24 DIAGNOSIS — R1110 Vomiting, unspecified: Secondary | ICD-10-CM

## 2011-12-24 DIAGNOSIS — R109 Unspecified abdominal pain: Secondary | ICD-10-CM

## 2011-12-24 LAB — CBC WITH DIFFERENTIAL/PLATELET
Basophils Relative: 0.1 % (ref 0.0–3.0)
Eosinophils Absolute: 0 10*3/uL (ref 0.0–0.7)
Hemoglobin: 18 g/dL (ref 13.0–17.0)
MCHC: 33.3 g/dL (ref 30.0–36.0)
MCV: 88.5 fl (ref 78.0–100.0)
Monocytes Absolute: 0.5 10*3/uL (ref 0.1–1.0)
Neutro Abs: 12.3 10*3/uL — ABNORMAL HIGH (ref 1.4–7.7)
RBC: 6.12 Mil/uL — ABNORMAL HIGH (ref 4.22–5.81)

## 2011-12-24 LAB — POCT URINALYSIS DIPSTICK
Blood, UA: NEGATIVE
Spec Grav, UA: <=1.005
Urobilinogen, UA: NEGATIVE
pH, UA: 8.5

## 2011-12-24 LAB — COMPREHENSIVE METABOLIC PANEL
AST: 22 U/L (ref 0–37)
Alkaline Phosphatase: 108 U/L (ref 39–117)
BUN: 18 mg/dL (ref 6–23)
Calcium: 10 mg/dL (ref 8.4–10.5)
Chloride: 104 mEq/L (ref 96–112)
Creatinine, Ser: 1.3 mg/dL (ref 0.4–1.5)
Glucose, Bld: 120 mg/dL — ABNORMAL HIGH (ref 70–99)

## 2011-12-24 MED ORDER — ONDANSETRON HCL 4 MG PO TABS: 4.0000 mg | ORAL_TABLET | Freq: Three times a day (TID) | ORAL | Status: AC | PRN

## 2011-12-24 MED ORDER — HYDROCODONE-ACETAMINOPHEN 5-500 MG PO TABS: 1.0000 | ORAL_TABLET | Freq: Four times a day (QID) | ORAL | Status: AC | PRN

## 2011-12-24 NOTE — Telephone Encounter (Signed)
Awaiting other labs and u/s.

## 2011-12-24 NOTE — Patient Instructions (Signed)
See Shirlee Limerick about your referral before you leave today. We'll contact you about the labs and the ultrasound.

## 2011-12-24 NOTE — Telephone Encounter (Signed)
Call Report:   Abdominal US.  No gallstones.  2 hyperechoic lesions in the liver consistent with hemangiomas.  Spleen is upper limits of normal.  Pancreas obscured by bowel gas.  Patient was told to go home and expect call from you there.

## 2011-12-24 NOTE — Telephone Encounter (Signed)
Critical lab, hgb 18.0

## 2011-12-24 NOTE — Progress Notes (Signed)
R abd and back pain 2 days ago, got better but not resolved, then at 3 AM today, woke up with vomiting.  Side pain was worse again.  No blood in vomit. No fevers.  No diarrhea.  No blood in stool.  Pain with urination, in suprapubic area.  No blood in urine, but it's dark.  H/o renal stones.  No known h/o gall stones.   Meds, vitals, and allergies reviewed.   ROS: See HPI.  Otherwise, noncontributory.  nad ncat Mmm rrr ctab Back w/o CVA pain abd soft, ttp in RUQ and suprapubic area, ttp in R inguinal area but w/o hernia noted.  No peritoneal signs. Not ttp in RLQ. No rebound.   Ext well perfused.   Labs reviewed.

## 2011-12-25 ENCOUNTER — Encounter: Payer: Self-pay | Admitting: Family Medicine

## 2011-12-25 ENCOUNTER — Ambulatory Visit (INDEPENDENT_AMBULATORY_CARE_PROVIDER_SITE_OTHER)
Admission: RE | Admit: 2011-12-25 | Discharge: 2011-12-25 | Disposition: A | Discharge status: Home / Self Care | Payer: BC Managed Care – PPO | Source: Ambulatory Visit | Attending: Family Medicine | Admitting: Family Medicine

## 2011-12-25 DIAGNOSIS — R109 Unspecified abdominal pain: Secondary | ICD-10-CM

## 2011-12-25 MED ORDER — IOHEXOL 300 MG/ML  SOLN
100.0000 mL | Freq: Once | INTRAMUSCULAR | Status: AC | PRN
  Administered 2011-12-25: 100 mL via INTRAVENOUS

## 2011-12-25 NOTE — Assessment & Plan Note (Addendum)
With dysuria and known h/o renal stones.  We checked u/s due to RUQ pain.  Neg for acute GB changes, stones.  I d/w pt via phone.  This could be due to renal stone.  He isn't ttp in RLQ and I don't suspect appendicitis, peritoneal process.  Will give patient vicodin and zofran for pain and nausea.  He'll call back in AM with update.  If not improved, then consider CT abd/pelvis.    Addendum.  I called pt ~8AM 12/25/11.  He continues to have abd pain, lower abd pain on R side.  Vomiting improved.  Will send for CT abd/pelvis.  He hasn't eaten or drank this AM.

## 2011-12-25 NOTE — Telephone Encounter (Signed)
See OV note.  

## 2011-12-26 LAB — URINE CULTURE
Colony Count: NO GROWTH
Organism ID, Bacteria: NO GROWTH

## 2011-12-28 ENCOUNTER — Telehealth: Payer: Self-pay | Admitting: Family Medicine

## 2011-12-28 NOTE — Telephone Encounter (Signed)
Message copied by Lars Mage on Mon Dec 28, 2011 10:56 AM ------      Message from: Annamarie Major      Created: Mon Dec 28, 2011 10:36 AM       He is doing much better.  He said they think it has to do with his digestive system and a lot of gas.  He is going to have to be more careful with his diet.      ----- Message -----         From: Joaquim Nam, MD         Sent: 12/28/2011           To: Annamarie Major, CMA            Please call pt and get an update on condition.  Thanks.              shaw

## 2011-12-28 NOTE — Telephone Encounter (Signed)
Noted  

## 2012-01-01 ENCOUNTER — Ambulatory Visit: Payer: BC Managed Care – PPO | Admitting: Family Medicine

## 2012-01-15 ENCOUNTER — Ambulatory Visit: Payer: BC Managed Care – PPO | Admitting: Family Medicine

## 2012-02-10 ENCOUNTER — Other Ambulatory Visit: Payer: Self-pay | Admitting: *Deleted

## 2012-02-10 MED ORDER — PANTOPRAZOLE SODIUM 40 MG PO TBEC: 40.0000 mg | DELAYED_RELEASE_TABLET | Freq: Two times a day (BID) | ORAL | Status: DC

## 2012-06-11 ENCOUNTER — Other Ambulatory Visit: Payer: Self-pay | Admitting: Family Medicine

## 2012-07-25 ENCOUNTER — Other Ambulatory Visit: Payer: Self-pay | Admitting: Family Medicine

## 2013-01-31 ENCOUNTER — Encounter: Payer: Self-pay | Admitting: Family Medicine

## 2013-01-31 ENCOUNTER — Ambulatory Visit (INDEPENDENT_AMBULATORY_CARE_PROVIDER_SITE_OTHER): Payer: BC Managed Care – PPO | Admitting: Family Medicine

## 2013-01-31 ENCOUNTER — Encounter: Payer: Self-pay | Admitting: Gastroenterology

## 2013-01-31 VITALS — BP 126/82 | HR 80 | Temp 98.9°F | Ht 69.0 in | Wt 197.5 lb

## 2013-01-31 DIAGNOSIS — R109 Unspecified abdominal pain: Secondary | ICD-10-CM | POA: Insufficient documentation

## 2013-01-31 DIAGNOSIS — K219 Gastro-esophageal reflux disease without esophagitis: Secondary | ICD-10-CM

## 2013-01-31 LAB — CBC WITH DIFFERENTIAL/PLATELET
Basophils Relative: 0.4 % (ref 0.0–3.0)
Eosinophils Absolute: 0.5 10*3/uL (ref 0.0–0.7)
Eosinophils Relative: 5 % (ref 0.0–5.0)
Hemoglobin: 16.9 g/dL (ref 13.0–17.0)
Lymphocytes Relative: 20.8 % (ref 12.0–46.0)
MCHC: 34.4 g/dL (ref 30.0–36.0)
Neutro Abs: 7 10*3/uL (ref 1.4–7.7)
RBC: 5.66 Mil/uL (ref 4.22–5.81)
WBC: 10.1 10*3/uL (ref 4.5–10.5)

## 2013-01-31 LAB — COMPREHENSIVE METABOLIC PANEL
Albumin: 4.5 g/dL (ref 3.5–5.2)
BUN: 6 mg/dL (ref 6–23)
CO2: 26 mEq/L (ref 19–32)
Calcium: 9.3 mg/dL (ref 8.4–10.5)
Chloride: 100 mEq/L (ref 96–112)
Creatinine, Ser: 1 mg/dL (ref 0.4–1.5)
GFR: 96.75 mL/min (ref >60.00)
Glucose, Bld: 89 mg/dL (ref 70–99)
Potassium: 4.4 mEq/L (ref 3.5–5.1)

## 2013-01-31 LAB — AMYLASE: Amylase: 36 U/L (ref 27–131)

## 2013-01-31 NOTE — Patient Instructions (Addendum)
Labs today  Try to avoid carbonation and also gas forming foods (beans)  Avoid alcohol You can also try BEANO over the counter Keep thinking about quitting smoking  We will refer you to GI at check out - talk to Fox on the way out

## 2013-01-31 NOTE — Assessment & Plan Note (Signed)
Heartburn was controlled with bid protonix (pt is a smoker)  However now having more gas issues/ bloating and pain  Ref to GI Lab today also

## 2013-01-31 NOTE — Assessment & Plan Note (Signed)
Pt continues to have diffuse abdominal pain - he indicates most tender area is in RLQ (no s/s/ of acute abd) Also gassiness/ reflux (smoker)  On bid PPI Rev CT and [place] from a year ago  At this point - will ref to GI for eval - ? If endoscopy will be needed  Lab today as well  Adv to keep diet bland/ work on quitting smoking and update in meantime if symptoms worsen

## 2013-01-31 NOTE — Progress Notes (Signed)
Subjective:    Patient ID: Donald Merritt, male    DOB: November 08, 1985, 27 y.o.   MRN: [id number]  HPI Here with GI symptoms  He "stays full of gas"  Never has been completely better from 3/13 GI symptoms At that time had nl [place] and CT abd pelvis   Not a lot of heartburn  Lot of upper abd bloating  Stomach hurts first thing in the am - gags a lot / nausea in the am - but does not vomit Pain whole abdomen -maybe a little worse on the right   Lot of belching and flatus also  bm 3-4 times per day-more loose than it used to be   Trying to watch what he eats - cut out fried foods  Cut back on soda-has not gotten rid of that   Had cut back to protonix to once daily- then went back up to twice daily   Does take some advil migraine  Takes that about once per week , and takes with food sometime    Still smoking - trying to quit-not successful  Used to drink fairly heavily but has not had a drink in about a month- is giving it up  Gained 10 lb over the winter - less active   New baby on the way  Has a 70 year old daughter   Patient Active Problem List  Diagnosis  . HYPERSOMNIA, PERSISTENT  . DEPRESSION  . ALLERGIC RHINITIS  . RENAL CALCULUS  . MICROSCOPIC HEMATURIA  . FLANK PAIN, RIGHT  . GERD (gastroesophageal reflux disease)   Past Medical History  Diagnosis Date  . Environmental allergies   . Panic attack   . Other and unspecified noninfectious gastroenteritis and colitis   . Unspecified sleep apnea   . Other malaise and fatigue   . Tobacco use disorder   . Depression   . Renal stones    No past surgical history on file. History  Substance Use Topics  . Smoking status: Current Every Day Smoker -- 0.50 packs/day for 15 years    Types: Cigarettes  . Smokeless tobacco: Former Neurosurgeon    Types: Snuff  . Alcohol Use: Yes     Comment: occaisionally   Family History  Problem Relation Age of Onset  . Asthma Mother   . Hypertension Father   . Heart failure Other   .  Diabetes Other    Allergies  Allergen Reactions  . Varenicline Tartrate Other (See Comments)    Was in here from 2009, but patient is not aware of any allergies.   Current Outpatient Prescriptions on File Prior to Visit  Medication Sig Dispense Refill  . Calcium & Magnesium Carbonates (MYLANTA PO) Take by mouth. After meals and at bedtime       . pantoprazole (PROTONIX) 40 MG tablet TAKE 1 TABLET (40 MG TOTAL) BY MOUTH 2 (TWO) TIMES DAILY.  180 tablet  0   No current facility-administered medications on file prior to visit.    Review of Systems Review of Systems  Constitutional: Negative for fever, appetite change, fatigue and unexpected weight change.  Eyes: Negative for pain and visual disturbance.  Respiratory: Negative for cough and shortness of breath.   Cardiovascular: Negative for cp or palpitations    Gastrointestinal: Negative for nausea, diarrhea and constipation. pos for abd pain / neg for frequent heartburn /neg for blood in stool or dark stool color  Genitourinary: Negative for urgency and frequency. neg for dysuria  Skin: Negative for  pallor or rash   Neurological: Negative for weakness, light-headedness, numbness and headaches.  Hematological: Negative for adenopathy. Does not bruise/bleed easily.  Psychiatric/Behavioral: Negative for dysphoric mood. The patient is not nervous/anxious.         Objective:   Physical Exam  Constitutional: He appears well-developed and well-nourished. No distress.  overwt and well appearing   HENT:  Head: Normocephalic and atraumatic.  Mouth/Throat: Oropharynx is clear and moist. No oropharyngeal exudate.  Eyes: Conjunctivae and EOM are normal. Pupils are equal, round, and reactive to light. No scleral icterus.  Neck: Normal range of motion. Neck supple. No JVD present. Carotid bruit is not present. No thyromegaly present.  Cardiovascular: Normal rate and regular rhythm.   Pulmonary/Chest: Effort normal and breath sounds normal. No  respiratory distress. He has no wheezes.  Diffusely distant bs   Abdominal: Soft. Normal appearance and bowel sounds are normal. He exhibits no distension, no abdominal bruit, no ascites and no mass. There is no hepatosplenomegaly. There is tenderness in the right lower quadrant and epigastric area. There is no rigidity, no rebound, no guarding, no CVA tenderness, no tenderness at McBurney's point and negative Murphy's sign.  Musculoskeletal: He exhibits no edema and no tenderness.  Lymphadenopathy:    He has no cervical adenopathy.  Neurological: He is alert. He has normal reflexes. No cranial nerve deficit. He exhibits normal muscle tone. Coordination normal.  Skin: Skin is warm and dry. No rash noted. No erythema. No pallor.  No jaundice   Psychiatric: He has a normal mood and affect.          Assessment & Plan:

## 2013-02-01 ENCOUNTER — Encounter: Payer: Self-pay | Admitting: *Deleted

## 2013-02-21 ENCOUNTER — Encounter: Payer: Self-pay | Admitting: Gastroenterology

## 2013-02-21 ENCOUNTER — Ambulatory Visit (INDEPENDENT_AMBULATORY_CARE_PROVIDER_SITE_OTHER): Payer: BC Managed Care – PPO | Admitting: Gastroenterology

## 2013-02-21 ENCOUNTER — Other Ambulatory Visit: Payer: Self-pay | Admitting: Gastroenterology

## 2013-02-21 VITALS — BP 124/80 | HR 84 | Ht 68.11 in | Wt 192.4 lb

## 2013-02-21 DIAGNOSIS — R143 Flatulence: Secondary | ICD-10-CM

## 2013-02-21 DIAGNOSIS — K219 Gastro-esophageal reflux disease without esophagitis: Secondary | ICD-10-CM

## 2013-02-21 DIAGNOSIS — R1084 Generalized abdominal pain: Secondary | ICD-10-CM

## 2013-02-21 DIAGNOSIS — R142 Eructation: Secondary | ICD-10-CM

## 2013-02-21 MED ORDER — HYOSCYAMINE SULFATE 0.125 MG SL SUBL: SUBLINGUAL_TABLET | SUBLINGUAL | Status: DC

## 2013-02-21 NOTE — Patient Instructions (Addendum)
You have been given a separate informational sheet regarding your tobacco use, the importance of quitting and local resources to help you quit.  You have been scheduled for an endoscopy with propofol. Please follow written instructions given to you at your visit today. If you use inhalers (even only as needed), please bring them with you on the day of your procedure. Your physician has requested that you go to www.startemmi.com and enter the access code given to you at your visit today. This web site gives a general overview about your procedure. However, you should still follow specific instructions given to you by our office regarding your preparation for the procedure.  Patient advised to avoid spicy, acidic, citrus, chocolate, mints, fruit and fruit juices.  Limit the intake of caffeine, alcohol and Soda.  Don't exercise too soon after eating.  Don't lie down within 3-4 hours of eating.  Elevate the head of your bed.  You have been given a Low gas diet.  Thank you for choosing me and  Gastroenterology.  Venita Lick. Pleas Koch., MD., Clement[facility] Graham

## 2013-02-21 NOTE — Progress Notes (Signed)
History of Present Illness: This is a 27 year old male who relates he two-year history of frequent reflux symptoms associated with morning vomiting generalized abdominal pain gas bloating and frequent watery nonbloody diarrhea. He has modified his diet to follow antireflux measures and increase pantoprazole to 40 mg daily to 40 mg twice daily with some improvement in symptoms. He recently underwent abdominal ultrasound imaging and CT imaging as outlined below. Recent blood work was unremarkable. Denies weight loss, constipation, change in stool caliber, melena, hematochezia, nausea, vomiting, dysphagia, reflux symptoms, chest pain.  Abdominal ultrasound:  1. No gallstones. No pain over the gallbladder.  2. No ductal dilatation. Two hyperechogenic foci within the right  lobe of liver consistent with hemangiomas.  3. The spleen is within upper limits of normal.  4. The pancreas is largely obscured by bowel gas.  Abdominal/pelvic CT:  No acute process. Normal appendix, without explanation for right  lower quadrant pain.   Review of Systems: Pertinent positive and negative review of systems were noted in the above HPI section. All other review of systems were otherwise negative.  Current Medications, Allergies, Past Medical History, Past Surgical History, Family History and Social History were reviewed in [employer] record.  Physical Exam: General: Well developed , well nourished, no acute distress Head: Normocephalic and atraumatic Eyes:  sclerae anicteric, EOMI Ears: Normal auditory acuity Mouth: No deformity or lesions Neck: Supple, no masses or thyromegaly Lungs: Clear throughout to auscultation Heart: Regular rate and rhythm; no murmurs, rubs or bruits Abdomen: Soft, mild generalized tenderness to deep palpation and non distended. No masses, hepatosplenomegaly or hernias noted. Normal Bowel sounds Musculoskeletal: Symmetrical with no gross deformities  Skin: No  lesions on visible extremities Pulses:  Normal pulses noted Extremities: No clubbing, cyanosis, edema or deformities noted Neurological: Alert oriented x 4, grossly nonfocal Cervical Nodes:  No significant cervical adenopathy Inguinal Nodes: No significant inguinal adenopathy Psychological:  Alert and cooperative. Normal mood and affect  Assessment and Recommendations:  1. Presumed GERD. Intensify antireflux measures. Continue pantoprazole 40 mg twice daily. Rule out esophagitis and Barrett's esophagus. Rule out ulcer disease. Schedule upper endoscopy. The risks, benefits, and alternatives to endoscopy with possible biopsy and possible dilation were discussed with the patient and they consent to proceed.    2. Gas, bloating, abdominal pain, loose stools. Symptoms complex concerning for irritable bowel syndrome. Trial of hyoscyamine as needed.

## 2013-02-22 ENCOUNTER — Encounter: Payer: Self-pay | Admitting: Gastroenterology

## 2013-03-15 ENCOUNTER — Ambulatory Visit (AMBULATORY_SURGERY_CENTER): Payer: BC Managed Care – PPO | Admitting: Gastroenterology

## 2013-03-15 ENCOUNTER — Encounter: Payer: Self-pay | Admitting: Gastroenterology

## 2013-03-15 VITALS — BP 124/89 | HR 67 | Temp 96.5°F | Resp 16 | Ht 68.0 in | Wt 192.0 lb

## 2013-03-15 DIAGNOSIS — K2970 Gastritis, unspecified, without bleeding: Secondary | ICD-10-CM

## 2013-03-15 DIAGNOSIS — D131 Benign neoplasm of stomach: Secondary | ICD-10-CM

## 2013-03-15 DIAGNOSIS — K219 Gastro-esophageal reflux disease without esophagitis: Secondary | ICD-10-CM

## 2013-03-15 DIAGNOSIS — K2960 Other gastritis without bleeding: Secondary | ICD-10-CM

## 2013-03-15 MED ORDER — SODIUM CHLORIDE 0.9 % IV SOLN: 500.0000 mL | INTRAVENOUS | Status: DC

## 2013-03-15 NOTE — Progress Notes (Signed)
Lidocaine-40mg IV prior to Propofol InductionPropofol given over incremental dosages 

## 2013-03-15 NOTE — Patient Instructions (Signed)
YOU HAD AN ENDOSCOPIC PROCEDURE TODAY AT THE [facility]: Refer to the procedure report that was given to you for any specific questions about what was found during the examination.  If the procedure report does not answer your questions, please call your gastroenterologist to clarify.  If you requested that your care partner not be given the details of your procedure findings, then the procedure report has been included in a sealed envelope for you to review at your convenience later.  YOU SHOULD EXPECT: Some feelings of bloating in the abdomen. Passage of more gas than usual.  Walking can help get rid of the air that was put into your GI tract during the procedure and reduce the bloating. If you had a lower endoscopy (such as a colonoscopy or flexible sigmoidoscopy) you may notice spotting of blood in your stool or on the toilet paper. If you underwent a bowel prep for your procedure, then you may not have a normal bowel movement for a few days.  DIET: Your first meal following the procedure should be a light meal and then it is ok to progress to your normal diet.  A half-sandwich or bowl of soup is an example of a good first meal.  Heavy or fried foods are harder to digest and may make you feel nauseous or bloated.  Likewise meals heavy in dairy and vegetables can cause extra gas to form and this can also increase the bloating.  Drink plenty of fluids but you should avoid alcoholic beverages for 24 hours.  ACTIVITY: Your care partner should take you home directly after the procedure.  You should plan to take it easy, moving slowly for the rest of the day.  You can resume normal activity the day after the procedure however you should NOT DRIVE or use heavy machinery for 24 hours (because of the sedation medicines used during the test).    SYMPTOMS TO REPORT IMMEDIATELY: A gastroenterologist can be reached at any hour.  During normal business hours, 8:30 AM to 5:00 PM Monday through Friday,  call [phone].  After hours and on weekends, please call the GI answering service at [phone] who will take a message and have the physician on call contact you.  Following upper endoscopy (EGD)  Vomiting of blood or coffee ground material  New chest pain or pain under the shoulder blades  Painful or persistently difficult swallowing  New shortness of breath  Fever of 100F or higher  Black, tarry-looking stools  FOLLOW UP: If any biopsies were taken you will be contacted by phone or by letter within the next 1-3 weeks.  Call your gastroenterologist if you have not heard about the biopsies in 3 weeks.  Our staff will call the home number listed on your records the next business day following your procedure to check on you and address any questions or concerns that you may have at that time regarding the information given to you following your procedure. This is a courtesy call and so if there is no answer at the home number and we have not heard from you through the emergency physician on call, we will assume that you have returned to your regular daily activities without incident.  SIGNATURES/CONFIDENTIALITY: You and/or your care partner have signed paperwork which will be entered into your electronic medical record.  These signatures attest to the fact that that the information above on your After Visit Summary has been reviewed and is understood.  Full responsibility of   the confidentiality of this discharge information lies with you and/or your care-partner. 

## 2013-03-15 NOTE — Op Note (Signed)
[facility] [address] [place], [address]   ENDOSCOPY PROCEDURE REPORT  PATIENT: Donald, Merritt  MR#: [id number] BIRTHDATE: [birth year] , 27  yrs. old GENDER: Male ENDOSCOPIST: Meryl Dare, MD, [facility] REFERRED BY:  Judy Pimple, M.D. PROCEDURE DATE:  03/15/2013 PROCEDURE:  EGD w/ biopsy ASA CLASS:     Class II INDICATIONS:  History of esophageal reflux.   abdominal pain. MEDICATIONS: MAC sedation, administered by CRNA and propofol (Diprivan) 300mg  IV TOPICAL ANESTHETIC: none DESCRIPTION OF PROCEDURE: After the risks benefits and alternatives of the procedure were thoroughly explained, informed consent was obtained.  The LB [id number] endoscope was introduced through the mouth and advanced to the second portion of the duodenum without limitations.  The instrument was slowly withdrawn as the mucosa was fully examined.  STOMACH: Mild gastritis, erythema, was found in the gastric body and fundus. Multiple biopsies were performed. The stomach otherwise appeared normal. ESOPHAGUS: The mucosa of the esophagus appeared normal. DUODENUM: The duodenal mucosa showed no abnormalities in the bulb and second portion of the duodenum.  Retroflexed views revealed a small hiatal hernia.   The scope was then withdrawn from the patient and the procedure completed.  COMPLICATIONS: There were no complications.  ENDOSCOPIC IMPRESSION: 1.   Mild gastritis in the gastric body; multiple biopsies 2.   Small hiatal hernia  RECOMMENDATIONS: 1.  Anti-reflux regimen 2.  Await pathology results 3.  Continue PPI bid, Levsin qid prn and Gas-X tid 4.  Call office next 2-3 days to schedule an office appointment for 4-6 weeks   eSigned:  Meryl Dare, MD, [facility] 03/15/2013 2:47 PM

## 2013-03-15 NOTE — Progress Notes (Signed)
Patient did not experience any of the following events: a burn prior to discharge; a fall within the facility; wrong site/side/patient/procedure/implant event; or a hospital transfer or hospital admission upon discharge from the facility. (G8907) Patient did not have preoperative order for IV antibiotic SSI prophylaxis. (G8918)  

## 2013-03-15 NOTE — Progress Notes (Signed)
Called to room to assist during endoscopic procedure.  Patient ID and intended procedure confirmed with present staff. Received instructions for my participation in the procedure from the performing physician.  

## 2013-03-16 ENCOUNTER — Telehealth: Payer: Self-pay | Admitting: *Deleted

## 2013-03-16 NOTE — Telephone Encounter (Signed)
  Follow up Call-  Call back number 03/15/2013  Post procedure Call Back phone  # [phone]  Permission to leave phone message Yes     Patient questions:  Do you have a fever, pain , or abdominal swelling? no Pain Score  0 *  Have you tolerated food without any problems? yes  Have you been able to return to your normal activities? yes  Do you have any questions about your discharge instructions: Diet   no Medications  no Follow up visit  no  Do you have questions or concerns about your Care? no  Actions: * If pain score is 4 or above: No action needed, pain <4.

## 2013-03-20 ENCOUNTER — Encounter: Payer: Self-pay | Admitting: Gastroenterology

## 2013-05-16 ENCOUNTER — Other Ambulatory Visit: Payer: Self-pay | Admitting: Family Medicine

## 2013-05-16 NOTE — Telephone Encounter (Signed)
Please refill for 6 months 

## 2013-05-16 NOTE — Telephone Encounter (Signed)
done

## 2013-05-16 NOTE — Telephone Encounter (Signed)
Electronic refill request, no recent f/u or CPE with you, please advise

## 2013-11-14 ENCOUNTER — Ambulatory Visit (INDEPENDENT_AMBULATORY_CARE_PROVIDER_SITE_OTHER)
Admission: RE | Admit: 2013-11-14 | Discharge: 2013-11-14 | Disposition: A | Discharge status: Home / Self Care | Payer: BC Managed Care – PPO | Source: Ambulatory Visit | Attending: Family Medicine | Admitting: Family Medicine

## 2013-11-14 ENCOUNTER — Ambulatory Visit (INDEPENDENT_AMBULATORY_CARE_PROVIDER_SITE_OTHER): Payer: BC Managed Care – PPO | Admitting: Family Medicine

## 2013-11-14 ENCOUNTER — Encounter: Payer: Self-pay | Admitting: Family Medicine

## 2013-11-14 VITALS — BP 122/78 | HR 78 | Temp 97.7°F | Wt 198.8 lb

## 2013-11-14 DIAGNOSIS — M546 Pain in thoracic spine: Secondary | ICD-10-CM

## 2013-11-14 DIAGNOSIS — M545 Low back pain, unspecified: Secondary | ICD-10-CM

## 2013-11-14 DIAGNOSIS — R109 Unspecified abdominal pain: Secondary | ICD-10-CM

## 2013-11-14 DIAGNOSIS — M549 Dorsalgia, unspecified: Secondary | ICD-10-CM | POA: Insufficient documentation

## 2013-11-14 DIAGNOSIS — N200 Calculus of kidney: Secondary | ICD-10-CM

## 2013-11-14 LAB — POCT URINALYSIS DIPSTICK
Bilirubin, UA: NEGATIVE
Glucose, UA: NEGATIVE
KETONES UA: NEGATIVE
LEUKOCYTES UA: NEGATIVE
NITRITE UA: NEGATIVE
PROTEIN UA: NEGATIVE
Spec Grav, UA: 1.01
Urobilinogen, UA: NEGATIVE
pH, UA: 8

## 2013-11-14 MED ORDER — HYDROCODONE-ACETAMINOPHEN 5-325 MG PO TABS: 1.0000 | ORAL_TABLET | Freq: Four times a day (QID) | ORAL | Status: DC | PRN

## 2013-11-14 NOTE — Progress Notes (Signed)
Pre-visit discussion using our clinic review tool. No additional management support is needed unless otherwise documented below in the visit note.  

## 2013-11-14 NOTE — Progress Notes (Signed)
Subjective:    Patient ID: Donald Merritt, male    DOB: 1986/03/14, 28 y.o.   MRN: [id number]  HPI Here with another episode of kidney stones   Mid back - and went down to low back , then around right side  Now pain is worse in the low back  Worse when he urinates  No blood in urine and he has not seen any stones pass   ua is clear today    Last CT 3/13 was normal  Saw urology in the past -- and given medicine about 2 years ago -and he passed it --thinks it may have been flomax   No nausea  Just pain  No fever   Drinking water and soda Not positional  Aleve helped a bit    Patient Active Problem List   Diagnosis Date Noted  . Abdominal pain, unspecified site 01/31/2013  . GERD (gastroesophageal reflux disease) 09/04/2011  . RENAL CALCULUS 03/12/2010  . MICROSCOPIC HEMATURIA 03/12/2010  . FLANK PAIN, RIGHT 03/12/2010  . [place], PERSISTENT 03/15/2008  . ALLERGIC RHINITIS 02/13/2008  . DEPRESSION 04/27/2007   Past Medical History  Diagnosis Date  . Environmental allergies   . Panic attack   . Other and unspecified noninfectious gastroenteritis and colitis(558.9)   . Unspecified sleep apnea   . Other malaise and fatigue   . Tobacco use disorder   . Depression   . Renal stones    Past Surgical History  Procedure Laterality Date  . Neg hx     History  Substance Use Topics  . Smoking status: Current Every Day Smoker -- 0.50 packs/day for 15 years    Types: Cigarettes  . Smokeless tobacco: Former Systems developer    Types: Snuff    Quit date: 10/20/2011  . Alcohol Use: Yes     Comment: occaisionally   Family History  Problem Relation Age of Onset  . Asthma Mother   . Hypertension Father   . Heart disease Paternal Grandmother   . Diabetes Paternal Grandmother   . Lactose intolerance Daughter    Allergies  Allergen Reactions  . Chantix [Varenicline]   . Varenicline Tartrate Other (See Comments)    Was in here from 2009, but patient is not aware of any  allergies.  . Betadine [Povidone Iodine] Rash   Current Outpatient Prescriptions on File Prior to Visit  Medication Sig Dispense Refill  . fexofenadine-pseudoephedrine (ALLEGRA-D 24) 180-240 MG per 24 hr tablet Take 1 tablet by mouth daily as needed.      . hyoscyamine (LEVSIN SL) 0.125 MG SL tablet 1-2 tablets by mouth or under tongue four times a day as needed for abd pain, bloating.  60 tablet  5  . pantoprazole (PROTONIX) 40 MG tablet TAKE 1 TABLET (40 MG TOTAL) BY MOUTH 2 (TWO) TIMES DAILY.  180 tablet  1   No current facility-administered medications on file prior to visit.      Review of Systems    Review of Systems  Constitutional: Negative for fever, appetite change, fatigue and unexpected weight change.  Eyes: Negative for pain and visual disturbance.  Respiratory: Negative for cough and shortness of breath.   Cardiovascular: Negative for cp or palpitations    Gastrointestinal: Negative for nausea, diarrhea and constipation.  Genitourinary: Negative for urgency and frequency.  Skin: Negative for pallor or rash   MSK pos for low to mid back pain and flank pain on the right  Neurological: Negative for weakness, light-headedness, numbness and headaches.  Hematological: Negative for adenopathy. Does not bruise/bleed easily.  Psychiatric/Behavioral: Negative for dysphoric mood. The patient is not nervous/anxious.      Objective:   Physical Exam  Constitutional: He appears well-developed and well-nourished. No distress.  HENT:  Head: Normocephalic and atraumatic.  Eyes: Conjunctivae and EOM are normal. Pupils are equal, round, and reactive to light.  Neck: Normal range of motion. Neck supple.  Cardiovascular: Normal rate and regular rhythm.   Pulmonary/Chest: Effort normal and breath sounds normal.  Abdominal: Soft. Bowel sounds are normal. He exhibits no distension and no mass. There is tenderness. There is CVA tenderness. There is no rigidity, no rebound and no guarding.    Mild R flank pain  Mild R cva tenderness  Musculoskeletal: He exhibits no edema.  Lymphadenopathy:    He has no cervical adenopathy.  Neurological: He is alert. He has normal reflexes. No cranial nerve deficit.  Skin: Skin is warm and dry. No rash noted. No erythema.  Psychiatric: He has a normal mood and affect.          Assessment & Plan:

## 2013-11-14 NOTE — Patient Instructions (Signed)
xrays now  Drink water and lemonade  Use heat on area that hurts  Here is px for hydrocodone pain med - use caution for sedation   We will make a plan based on results  Stop up front to sign a release to get last urology note

## 2013-11-15 NOTE — Assessment & Plan Note (Signed)
With flank pain and thoracic pain Hx of kidney stone in past  Neg ua today  xr of TS and abd today

## 2013-11-15 NOTE — Assessment & Plan Note (Signed)
With mid back pain and hx of kidney stones Neg ua  KUB and TS xray today  Rev CT from 2013 neg  Sent for last urol note norco given for pain  Pend results

## 2013-11-16 ENCOUNTER — Telehealth: Payer: Self-pay | Admitting: Family Medicine

## 2013-11-16 DIAGNOSIS — R109 Unspecified abdominal pain: Secondary | ICD-10-CM

## 2013-11-16 DIAGNOSIS — Z87442 Personal history of urinary calculi: Secondary | ICD-10-CM

## 2013-11-16 NOTE — Telephone Encounter (Signed)
Referral done

## 2013-11-16 NOTE — Telephone Encounter (Signed)
Message copied by Abner Greenspan on Thu Nov 16, 2013 10:44 AM ------      Message from: Tammi Sou      Created: Thu Nov 16, 2013  9:38 AM       Pt notified of xray results. Pt is okay with referral to urologist, pt advise Marion/Linda will call to schedule appt., pt also advised it's okay to take pain med as prescribed (prn), pt verbalized understanding ------

## 2014-01-16 ENCOUNTER — Encounter: Payer: Self-pay | Admitting: Family Medicine

## 2014-01-16 ENCOUNTER — Ambulatory Visit (INDEPENDENT_AMBULATORY_CARE_PROVIDER_SITE_OTHER): Payer: BC Managed Care – PPO | Admitting: Family Medicine

## 2014-01-16 VITALS — BP 120/80 | HR 80 | Temp 97.7°F | Ht 69.0 in | Wt 201.5 lb

## 2014-01-16 DIAGNOSIS — S30860A Insect bite (nonvenomous) of lower back and pelvis, initial encounter: Secondary | ICD-10-CM | POA: Insufficient documentation

## 2014-01-16 DIAGNOSIS — W57XXXA Bitten or stung by nonvenomous insect and other nonvenomous arthropods, initial encounter: Principal | ICD-10-CM

## 2014-01-16 MED ORDER — DOXYCYCLINE HYCLATE 100 MG PO TABS: 100.0000 mg | ORAL_TABLET | Freq: Two times a day (BID) | ORAL | Status: DC

## 2014-01-16 NOTE — Assessment & Plan Note (Signed)
2-3 weeks ago now with round lesion on mid back and scant central clearing  Disc poss of tick rel illness like lyme or STARI Empiric tx doxy 100 mg bid 10 d Lyme ab tests today Update if not starting to improve in a week or if worsening  - rev symptoms to watch for

## 2014-01-16 NOTE — Progress Notes (Signed)
Pre visit review using our clinic review tool, if applicable. No additional management support is needed unless otherwise documented below in the visit note. 

## 2014-01-16 NOTE — Progress Notes (Signed)
Subjective:    Patient ID: Donald Merritt, male    DOB: 10-27-1985, 28 y.o.   MRN: [id number]  HPI Here with a tick bite  Thinks he pulled it off 2-3 weeks ago  Very tiny tick  ? How long it was on there-suspects not on there for long at all - was not very embedded  On L side of back   The side is itchy-tries not to scratch it Not putting anything on it   He had a headache on Saturday-? If significant No fever  No ST but glands got swollen for a day  No no joint pain or swelling  No rash   Works outside   Patient Active Problem List   Diagnosis Date Noted  . Right low back pain 11/14/2013  . Right flank pain 11/14/2013  . Mid back pain on right side 11/14/2013  . Abdominal pain, unspecified site 01/31/2013  . GERD (gastroesophageal reflux disease) 09/04/2011  . History of kidney stones 03/12/2010  . MICROSCOPIC HEMATURIA 03/12/2010  . FLANK PAIN, RIGHT 03/12/2010  . [place], PERSISTENT 03/15/2008  . ALLERGIC RHINITIS 02/13/2008  . DEPRESSION 04/27/2007   Past Medical History  Diagnosis Date  . Environmental allergies   . Panic attack   . Other and unspecified noninfectious gastroenteritis and colitis(558.9)   . Unspecified sleep apnea   . Other malaise and fatigue   . Tobacco use disorder   . Depression   . Renal stones    Past Surgical History  Procedure Laterality Date  . Neg hx     History  Substance Use Topics  . Smoking status: Current Every Day Smoker -- 0.50 packs/day for 15 years    Types: Cigarettes  . Smokeless tobacco: Former Systems developer    Types: Snuff    Quit date: 10/20/2011  . Alcohol Use: Yes     Comment: occaisionally   Family History  Problem Relation Age of Onset  . Asthma Mother   . Hypertension Father   . Heart disease Paternal Grandmother   . Diabetes Paternal Grandmother   . Lactose intolerance Daughter    Allergies  Allergen Reactions  . Chantix [Varenicline]   . Varenicline Tartrate Other (See Comments)    Was in here  from 2009, but patient is not aware of any allergies.  . Betadine [Povidone Iodine] Rash   Current Outpatient Prescriptions on File Prior to Visit  Medication Sig Dispense Refill  . fexofenadine-pseudoephedrine (ALLEGRA-D 24) 180-240 MG per 24 hr tablet Take 1 tablet by mouth daily as needed.      . hyoscyamine (LEVSIN SL) 0.125 MG SL tablet 1-2 tablets by mouth or under tongue four times a day as needed for abd pain, bloating.  60 tablet  5  . pantoprazole (PROTONIX) 40 MG tablet TAKE 1 TABLET (40 MG TOTAL) BY MOUTH 2 (TWO) TIMES DAILY.  180 tablet  1   No current facility-administered medications on file prior to visit.      Review of Systems Review of Systems  Constitutional: Negative for fever, appetite change, fatigue and unexpected weight change.  Eyes: Negative for pain and visual disturbance.  Respiratory: Negative for cough and shortness of breath.   Cardiovascular: Negative for cp or palpitations    Gastrointestinal: Negative for nausea, diarrhea and constipation.  Genitourinary: Negative for urgency and frequency.  Skin: Negative for pallor and pos for area of redness at site of prior tick bite (neg for diffuse rash) MSK neg for joint pain or swelling  Neurological: Negative for weakness, light-headedness, numbness and headaches.  Hematological: Negative for adenopathy. Does not bruise/bleed easily.  Psychiatric/Behavioral: Negative for dysphoric mood. The patient is not nervous/anxious.         Objective:   Physical Exam  Constitutional: He appears well-developed and well-nourished.  HENT:  Head: Normocephalic and atraumatic.  Eyes: Conjunctivae and EOM are normal. Pupils are equal, round, and reactive to light. Right eye exhibits no discharge. Left eye exhibits no discharge. No scleral icterus.  Neck: Normal range of motion. Neck supple.  Cardiovascular: Normal rate and regular rhythm.   Pulmonary/Chest: Effort normal and breath sounds normal. No respiratory  distress. He has no wheezes. He has no rales.  Musculoskeletal: He exhibits no edema.  Lymphadenopathy:    He has no cervical adenopathy.  Neurological: He is alert. He has normal reflexes.  Skin: Skin is warm and dry. There is erythema. No pallor.  L mid back - 4-5 cm round area of erythema and induration with healed scab (tiny) in center  Very scant central clearing but not classic bullseye appearance   Psychiatric: He has a normal mood and affect.          Assessment & Plan:

## 2014-01-16 NOTE — Patient Instructions (Signed)
Use cort aid over the counter to bite area for itching , keep it clean  Take the doxycycline as directed  Lab today for lyme antibodies  Use insect repellent to avoid tick bites  Update if not starting to improve in a week or if worsening  - especially if fever or other symptoms

## 2014-01-17 LAB — B. BURGDORFI ANTIBODIES: B burgdorferi Ab IgG+IgM: 0.2 {ISR}

## 2014-02-19 ENCOUNTER — Emergency Department (HOSPITAL_COMMUNITY)
Admission: EM | Admit: 2014-02-19 | Discharge: 2014-02-19 | Disposition: A | Discharge status: Home / Self Care | Payer: BC Managed Care – PPO | Attending: Emergency Medicine | Admitting: Emergency Medicine

## 2014-02-19 ENCOUNTER — Encounter (HOSPITAL_COMMUNITY): Payer: Self-pay | Admitting: Emergency Medicine

## 2014-02-19 DIAGNOSIS — F17200 Nicotine dependence, unspecified, uncomplicated: Secondary | ICD-10-CM | POA: Insufficient documentation

## 2014-02-19 DIAGNOSIS — A088 Other specified intestinal infections: Secondary | ICD-10-CM | POA: Insufficient documentation

## 2014-02-19 DIAGNOSIS — Z8659 Personal history of other mental and behavioral disorders: Secondary | ICD-10-CM | POA: Insufficient documentation

## 2014-02-19 DIAGNOSIS — Z79899 Other long term (current) drug therapy: Secondary | ICD-10-CM | POA: Insufficient documentation

## 2014-02-19 DIAGNOSIS — Z87442 Personal history of urinary calculi: Secondary | ICD-10-CM | POA: Insufficient documentation

## 2014-02-19 DIAGNOSIS — E860 Dehydration: Secondary | ICD-10-CM | POA: Insufficient documentation

## 2014-02-19 DIAGNOSIS — A084 Viral intestinal infection, unspecified: Secondary | ICD-10-CM

## 2014-02-19 DIAGNOSIS — K219 Gastro-esophageal reflux disease without esophagitis: Secondary | ICD-10-CM | POA: Insufficient documentation

## 2014-02-19 HISTORY — DX: Gastro-esophageal reflux disease without esophagitis: K21.9

## 2014-02-19 LAB — BASIC METABOLIC PANEL
BUN: 25 mg/dL — AB (ref 6–23)
CALCIUM: 10 mg/dL (ref 8.4–10.5)
CO2: 19 mEq/L (ref 19–32)
Chloride: 96 mEq/L (ref 96–112)
Creatinine, Ser: 1.32 mg/dL (ref 0.50–1.35)
GFR calc Af Amer: 83 mL/min — ABNORMAL LOW (ref >90)
GFR, EST NON AFRICAN AMERICAN: 72 mL/min — AB (ref >90)
Glucose, Bld: 126 mg/dL — ABNORMAL HIGH (ref 70–99)
Potassium: 4.9 mEq/L (ref 3.7–5.3)
SODIUM: 134 meq/L — AB (ref 137–147)

## 2014-02-19 LAB — CBC
HCT: 52.5 % — ABNORMAL HIGH (ref 39.0–52.0)
Hemoglobin: 18.8 g/dL — ABNORMAL HIGH (ref 13.0–17.0)
MCH: 30.4 pg (ref 26.0–34.0)
MCHC: 35.8 g/dL (ref 30.0–36.0)
MCV: 84.8 fL (ref 78.0–100.0)
PLATELETS: 205 10*3/uL (ref 150–400)
RBC: 6.19 MIL/uL — ABNORMAL HIGH (ref 4.22–5.81)
RDW: 12.7 % (ref 11.5–15.5)
WBC: 18.3 10*3/uL — ABNORMAL HIGH (ref 4.0–10.5)

## 2014-02-19 MED ORDER — PROMETHAZINE HCL 25 MG PO TABS: 25.0000 mg | ORAL_TABLET | Freq: Four times a day (QID) | ORAL | Status: DC | PRN

## 2014-02-19 MED ORDER — ONDANSETRON HCL 4 MG/2ML IJ SOLN
4.0000 mg | Freq: Once | INTRAMUSCULAR | Status: AC
Start: 2014-02-19 — End: 2014-02-19
  Administered 2014-02-19: 4 mg via INTRAVENOUS
  Filled 2014-02-19: qty 2

## 2014-02-19 MED ORDER — SODIUM CHLORIDE 0.9 % IV BOLUS (SEPSIS)
1000.0000 mL | Freq: Once | INTRAVENOUS | Status: AC
  Administered 2014-02-19: 1000 mL via INTRAVENOUS

## 2014-02-19 MED ORDER — TRAMADOL HCL 50 MG PO TABS: 50.0000 mg | ORAL_TABLET | Freq: Four times a day (QID) | ORAL | Status: DC | PRN

## 2014-02-19 MED ORDER — ONDANSETRON 4 MG PO TBDP
8.0000 mg | ORAL_TABLET | Freq: Once | ORAL | Status: AC
  Administered 2014-02-19: 8 mg via ORAL
  Filled 2014-02-19: qty 2

## 2014-02-19 MED ORDER — OXYCODONE-ACETAMINOPHEN 5-325 MG PO TABS
2.0000 | ORAL_TABLET | Freq: Once | ORAL | Status: AC
  Administered 2014-02-19: 2 via ORAL
  Filled 2014-02-19: qty 2

## 2014-02-19 NOTE — Discharge Instructions (Signed)
Dehydration, Adult °Dehydration is when you lose more fluids from the body than you take in. Vital organs like the kidneys, brain, and heart cannot function without a proper amount of fluids and salt. Any loss of fluids from the body can cause dehydration.  °CAUSES  °· Vomiting. °· Diarrhea. °· Excessive sweating. °· Excessive urine output. °· Fever. °SYMPTOMS  °Mild dehydration °· Thirst. °· Dry lips. °· Slightly dry mouth. °Moderate dehydration °· Very dry mouth. °· Sunken eyes. °· Skin does not bounce back quickly when lightly pinched and released. °· Dark urine and decreased urine production. °· Decreased tear production. °· Headache. °Severe dehydration °· Very dry mouth. °· Extreme thirst. °· Rapid, weak pulse (more than 100 beats per minute at rest). °· Cold hands and feet. °· Not able to sweat in spite of heat and temperature. °· Rapid breathing. °· Blue lips. °· Confusion and lethargy. °· Difficulty being awakened. °· Minimal urine production. °· No tears. °DIAGNOSIS  °Your caregiver will diagnose dehydration based on your symptoms and your exam. Blood and urine tests will help confirm the diagnosis. The diagnostic evaluation should also identify the cause of dehydration. °TREATMENT  °Treatment of mild or moderate dehydration can often be done at home by increasing the amount of fluids that you drink. It is best to drink small amounts of fluid more often. Drinking too much at one time can make vomiting worse. Refer to the home care instructions below. °Severe dehydration needs to be treated at the hospital where you will probably be given intravenous (IV) fluids that contain water and electrolytes. °HOME CARE INSTRUCTIONS  °· Ask your caregiver about specific rehydration instructions. °· Drink enough fluids to keep your urine clear or pale yellow. °· Drink small amounts frequently if you have nausea and vomiting. °· Eat as you normally do. °· Avoid: °· Foods or drinks high in sugar. °· Carbonated  drinks. °· Juice. °· Extremely hot or cold fluids. °· Drinks with caffeine. °· Fatty, greasy foods. °· Alcohol. °· Tobacco. °· Overeating. °· Gelatin desserts. °· Wash your hands well to avoid spreading bacteria and viruses. °· Only take over-the-counter or prescription medicines for pain, discomfort, or fever as directed by your caregiver. °· Ask your caregiver if you should continue all prescribed and over-the-counter medicines. °· Keep all follow-up appointments with your caregiver. °SEEK MEDICAL CARE IF: °· You have abdominal pain and it increases or stays in one area (localizes). °· You have a rash, stiff neck, or severe headache. °· You are irritable, sleepy, or difficult to awaken. °· You are weak, dizzy, or extremely thirsty. °SEEK IMMEDIATE MEDICAL CARE IF:  °· You are unable to keep fluids down or you get worse despite treatment. °· You have frequent episodes of vomiting or diarrhea. °· You have blood or green matter (bile) in your vomit. °· You have blood in your stool or your stool looks black and tarry. °· You have not urinated in 6 to 8 hours, or you have only urinated a small amount of very dark urine. °· You have a fever. °· You faint. °MAKE SURE YOU:  °· Understand these instructions. °· Will watch your condition. °· Will get help right away if you are not doing well or get worse. °Document Released: 10/05/2005 Document Revised: 12/28/2011 Document Reviewed: 05/25/2011 °[employer]® Patient Information ©2014 [employer]. °Viral Gastroenteritis °Viral gastroenteritis is also known as stomach flu. This condition affects the stomach and intestinal tract. It can cause sudden diarrhea and vomiting. The illness typically   typically lasts 3 to 8 days. Most people develop an immune response that eventually gets rid of the virus. While this natural response develops, the virus can make you quite ill. CAUSES  Many different viruses can cause gastroenteritis, such as rotavirus or noroviruses. You can catch one of  these viruses by consuming contaminated food or water. You may also catch a virus by sharing utensils or other personal items with an infected person or by touching a contaminated surface. SYMPTOMS  The most common symptoms are diarrhea and vomiting. These problems can cause a severe loss of body fluids (dehydration) and a body salt (electrolyte) imbalance. Other symptoms may include:  Fever.  Headache.  Fatigue.  Abdominal pain. DIAGNOSIS  Your caregiver can usually diagnose viral gastroenteritis based on your symptoms and a physical exam. A stool sample may also be taken to test for the presence of viruses or other infections. TREATMENT  This illness typically goes away on its own. Treatments are aimed at rehydration. The most serious cases of viral gastroenteritis involve vomiting so severely that you are not able to keep fluids down. In these cases, fluids must be given through an intravenous line (IV). HOME CARE INSTRUCTIONS   Drink enough fluids to keep your urine clear or pale yellow. Drink small amounts of fluids frequently and increase the amounts as tolerated.  Ask your caregiver for specific rehydration instructions.  Avoid:  Foods high in sugar.  Alcohol.  Carbonated drinks.  Tobacco.  Juice.  Caffeine drinks.  Extremely hot or cold fluids.  Fatty, greasy foods.  Too much intake of anything at one time.  Dairy products until 24 to 48 hours after diarrhea stops.  You may consume probiotics. Probiotics are active cultures of beneficial bacteria. They may lessen the amount and number of diarrheal stools in adults. Probiotics can be found in yogurt with active cultures and in supplements.  Wash your hands well to avoid spreading the virus.  Only take over-the-counter or prescription medicines for pain, discomfort, or fever as directed by your caregiver. Do not give aspirin to children. Antidiarrheal medicines are not recommended.  Ask your caregiver if you  should continue to take your regular prescribed and over-the-counter medicines.  Keep all follow-up appointments as directed by your caregiver. SEEK IMMEDIATE MEDICAL CARE IF:   You are unable to keep fluids down.  You do not urinate at least once every 6 to 8 hours.  You develop shortness of breath.  You notice blood in your stool or vomit. This may look like coffee grounds.  You have abdominal pain that increases or is concentrated in one small area (localized).  You have persistent vomiting or diarrhea.  You have a fever.  The patient is a child younger than 3 months, and he or she has a fever.  The patient is a child older than 3 months, and he or she has a fever and persistent symptoms.  The patient is a child older than 3 months, and he or she has a fever and symptoms suddenly get worse.  The patient is a baby, and he or she has no tears when crying. MAKE SURE YOU:   Understand these instructions.  Will watch your condition.  Will get help right away if you are not doing well or get worse. Document Released: 10/05/2005 Document Revised: 12/28/2011 Document Reviewed: 07/22/2011 [facility] Patient Information 2014 [place]. Diet for Diarrhea, Adult Frequent, runny stools (diarrhea) may be caused or worsened by food or drink. Diarrhea may be  relieved by changing your diet. Since diarrhea can last up to 7 days, it is easy for you to lose too much fluid from the body and become dehydrated. Fluids that are lost need to be replaced. Along with a modified diet, make sure you drink enough fluids to keep your urine clear or pale yellow. DIET INSTRUCTIONS  Ensure adequate fluid intake (hydration): have 1 cup (8 oz) of fluid for each diarrhea episode. Avoid fluids that contain simple sugars or sports drinks, fruit juices, whole milk products, and sodas. Your urine should be clear or pale yellow if you are drinking enough fluids. Hydrate with an oral rehydration solution that you  can purchase at pharmacies, retail stores, and online. You can prepare an oral rehydration solution at home by mixing the following ingredients together:    tsp table salt.   tsp baking soda.   tsp salt substitute containing potassium chloride.  1  tablespoons sugar.  1 L (34 oz) of water.  Certain foods and beverages may increase the speed at which food moves through the gastrointestinal (GI) tract. These foods and beverages should be avoided and include:  Caffeinated and alcoholic beverages.  High-fiber foods, such as raw fruits and vegetables, nuts, seeds, and whole grain breads and cereals.  Foods and beverages sweetened with sugar alcohols, such as xylitol, sorbitol, and mannitol.  Some foods may be well tolerated and may help thicken stool including:  Starchy foods, such as rice, toast, pasta, low-sugar cereal, oatmeal, grits, baked potatoes, crackers, and bagels.   Bananas.   Applesauce.  Add probiotic-rich foods to help increase healthy bacteria in the GI tract, such as yogurt and fermented milk products. RECOMMENDED FOODS AND BEVERAGES Starches Choose foods with less than 2 g of fiber per serving.  Recommended:  White, [place], and pita breads, plain rolls, buns, bagels. Plain muffins, matzo. Soda, saltine, or graham crackers. Pretzels, melba toast, zwieback. Cooked cereals made with water: cornmeal, farina, cream cereals. Dry cereals: refined corn, wheat, rice. Potatoes prepared any way without skins, refined macaroni, spaghetti, noodles, refined rice.  Avoid:  Bread, rolls, or crackers made with whole wheat, multi-grains, rye, bran seeds, nuts, or coconut. Corn tortillas or taco shells. Cereals containing whole grains, multi-grains, bran, coconut, nuts, raisins. Cooked or dry oatmeal. Coarse wheat cereals, granola. Cereals advertised as "high-fiber." Potato skins. Whole grain pasta, wild or brown rice. Popcorn. Sweet potatoes, yams. Sweet rolls, doughnuts, waffles,  pancakes, sweet breads. Vegetables  Recommended: Strained tomato and vegetable juices. Most well-cooked and canned vegetables without seeds. Fresh: Tender lettuce, cucumber without the skin, cabbage, spinach, bean sprouts.  Avoid: Fresh, cooked, or canned: Artichokes, baked beans, beet greens, broccoli, Brussels sprouts, corn, kale, legumes, peas, sweet potatoes. Cooked: Green or red cabbage, spinach. Avoid large servings of any vegetables because vegetables shrink when cooked, and they contain more fiber per serving than fresh vegetables. Fruit  Recommended: Cooked or canned: Apricots, applesauce, cantaloupe, cherries, fruit cocktail, grapefruit, grapes, kiwi, mandarin oranges, peaches, pears, plums, watermelon. Fresh: Apples without skin, ripe banana, grapes, cantaloupe, cherries, grapefruit, peaches, oranges, plums. Keep servings limited to  cup or 1 piece.  Avoid: Fresh: Apples with skin, apricots, mangoes, pears, raspberries, strawberries. Prune juice, stewed or dried prunes. Dried fruits, raisins, dates. Large servings of all fresh fruits. Protein  Recommended: Ground or well-cooked tender beef, ham, veal, lamb, pork, or poultry. Eggs. Fish, oysters, shrimp, lobster, other seafoods. Liver, organ meats.  Avoid: Tough, fibrous meats with gristle. Peanut butter, smooth or chunky. Cheese, nuts,  seeds, legumes, dried peas, beans, lentils. Dairy  Recommended: Yogurt, lactose-free milk, kefir, drinkable yogurt, buttermilk, soy milk, or plain hard cheese.  Avoid: Milk, chocolate milk, beverages made with milk, such as milkshakes. Soups  Recommended: Bouillon, broth, or soups made from allowed foods. Any strained soup.  Avoid: Soups made from vegetables that are not allowed, cream or milk-based soups. Desserts and Sweets  Recommended: Sugar-free gelatin, sugar-free frozen ice pops made without sugar alcohol.  Avoid: Plain cakes and cookies, pie made with fruit, pudding, custard, cream  pie. Gelatin, fruit, ice, sherbet, frozen ice pops. Ice cream, ice milk without nuts. Plain hard candy, honey, jelly, molasses, syrup, sugar, chocolate syrup, gumdrops, marshmallows. Fats and Oils  Recommended: Limit fats to less than 8 tsp per day.  Avoid: Seeds, nuts, olives, avocados. Margarine, butter, cream, mayonnaise, salad oils, plain salad dressings. Plain gravy, crisp bacon without rind. Beverages  Recommended: Water, decaffeinated teas, oral rehydration solutions, sugar-free beverages not sweetened with sugar alcohols.  Avoid: Fruit juices, caffeinated beverages (coffee, tea, soda), alcohol, sports drinks, or lemon-lime soda. Condiments  Recommended: Ketchup, mustard, horseradish, vinegar, cocoa powder. Spices in moderation: allspice, basil, bay leaves, celery powder or leaves, cinnamon, cumin powder, curry powder, ginger, mace, marjoram, onion or garlic powder, oregano, paprika, parsley flakes, ground pepper, rosemary, sage, savory, tarragon, thyme, turmeric.  Avoid: Coconut, honey. Document Released: 12/26/2003 Document Revised: 06/29/2012 Document Reviewed: 02/19/2012 [facility] Patient Information 2014 [place].

## 2014-02-19 NOTE — ED Notes (Addendum)
Pt reports n,v, diarrr since yesteday. Everyone in household is sick and here to be seen in ER. Reports he has been able to keep 1 bottle of water down and one he is sipping in waiting room. Pt asked to remain NPO at this time. Chills reported at night.

## 2014-02-19 NOTE — ED Provider Notes (Signed)
CSN: [id number]     Arrival date & time 02/19/14  1033 History   First MD Initiated Contact with Patient 02/19/14 1328     Chief Complaint  Patient presents with  . Emesis     (Consider location/radiation/quality/duration/timing/severity/associated sxs/prior Treatment) HPI  Patient to the ER for evaluation of nausea, vomiting and diarrhea. His wife and two children are here to be seen for the same symptoms. They ate at the same restaurant and developed symptoms yesterday. He denies having blood in his diarrhea or vomit. He denies having urinary symptoms, weakness, SOB, He has been able to sip slowly on fluids but has not been tolerating solids. He has not had any fevers but reports chills. He reports being healthy at baseline. Describes his pain in the abdomen as diffuse and cramping. NAD  Past Medical History  Diagnosis Date  . Environmental allergies   . Panic attack   . Other and unspecified noninfectious gastroenteritis and colitis(558.9)   . Unspecified sleep apnea   . Other malaise and fatigue   . Tobacco use disorder   . Depression   . Renal stones   . GERD (gastroesophageal reflux disease)    Past Surgical History  Procedure Laterality Date  . Neg hx     Family History  Problem Relation Age of Onset  . Asthma Mother   . Hypertension Father   . Heart disease Paternal Grandmother   . Diabetes Paternal Grandmother   . Lactose intolerance Daughter    History  Substance Use Topics  . Smoking status: Current Every Day Smoker -- 0.50 packs/day for 15 years    Types: Cigarettes  . Smokeless tobacco: Former Systems developer    Types: Snuff    Quit date: 10/20/2011  . Alcohol Use: Yes     Comment: occaisionally    Review of Systems   Review of Systems  Gen: no weight loss, fevers, chills, night sweats  Eyes: no discharge or drainage, no occular pain or visual changes  Nose: no epistaxis or rhinorrhea  Mouth: no dental pain, no sore throat  Neck: no neck pain  Lungs:No  wheezing, coughing or hemoptysis CV: no chest pain, palpitations, dependent edema or orthopnea  Abd: + abdominal pain, nausea, vomiting, diarrhea GU: no dysuria or gross hematuria  MSK:  No muscle weakness or pain Neuro: no headache, no focal neurologic deficits  Skin: no rash or wounds Psyche: no complaints    Allergies  Chantix; Varenicline tartrate; and Betadine  Home Medications   Prior to Admission medications   Medication Sig Start Date End Date Taking? Authorizing Provider  fexofenadine-pseudoephedrine (ALLEGRA-D 24) 180-240 MG per 24 hr tablet Take 1 tablet by mouth daily as needed (for allergies).    Yes Historical Provider, MD  hyoscyamine (LEVSIN SL) 0.125 MG SL tablet Place 0.125-0.25 mg under the tongue every 6 (six) hours as needed for cramping.   Yes Historical Provider, MD  pantoprazole (PROTONIX) 40 MG tablet Take 40 mg by mouth 2 (two) times daily.   Yes Historical Provider, MD   BP 122/66  Pulse 88  Temp(Src) 98.5 F (36.9 C) (Oral)  Resp 18  Ht 5\' 9"  (1.753 m)  Wt 201 lb (91.173 kg)  BMI 29.67 kg/m2  SpO2 99% Physical Exam  Nursing note and vitals reviewed. Constitutional: He appears well-developed and well-nourished. No distress.  HENT:  Head: Normocephalic and atraumatic.  Eyes: Pupils are equal, round, and reactive to light.  Neck: Normal range of motion. Neck supple.  Cardiovascular: Normal  rate and regular rhythm.   Pulmonary/Chest: Effort normal. He has no wheezes. He has no rales.  Abdominal: Soft. Bowel sounds are normal. There is tenderness (diffuse and mild). There is no rebound and no guarding.  Neurological: He is alert.  Skin: Skin is warm and dry.    ED Course  Procedures (including critical care time) Labs Review Labs Reviewed  BASIC METABOLIC PANEL - Abnormal; Notable for the following:    Sodium 134 (*)    Glucose, Bld 126 (*)    BUN 25 (*)    GFR calc non Af Amer 72 (*)    GFR calc Af Amer 83 (*)    All other components  within normal limits  CBC - Abnormal; Notable for the following:    WBC 18.3 (*)    RBC 6.19 (*)    Hemoglobin 18.8 (*)    HCT 52.5 (*)    All other components within normal limits    Imaging Review No results found.   EKG Interpretation None      MDM   Final diagnoses:  Dehydration  Viral gastroenteritis    Pt hydrated in the ED, his labs showed that he was suffering from dehydration from the vomiting and diarrhea. He was given 2 L NS IV, nausea and pain medication. He reports feeling significantly better. He tolerated oral without any abdominal pain or vomiting.  Will give him pt education on the BRAT diet, and give nausea and pain medications for home. Given strict return to ED precautions.  28 y.o.Donald Merritt's evaluation in the Emergency Department is complete. It has been determined that no acute conditions requiring further emergency intervention are present at this time. The patient/guardian have been advised of the diagnosis and plan. We have discussed signs and symptoms that warrant return to the ED, such as changes or worsening in symptoms.  Vital signs are stable at discharge. Filed Vitals:   02/19/14 1545  BP: 122/66  Pulse: 88  Temp:   Resp:     Patient/guardian has voiced understanding and agreed to follow-up with the PCP or specialist.     Linus Mako, PA-C 02/19/14 1603

## 2014-02-22 NOTE — ED Provider Notes (Signed)
Medical screening examination/treatment/procedure(s) were performed by non-physician practitioner and as supervising physician I was immediately available for consultation/collaboration.   Leota Jacobsen, MD 02/22/14 [phone]

## 2014-02-23 ENCOUNTER — Ambulatory Visit (INDEPENDENT_AMBULATORY_CARE_PROVIDER_SITE_OTHER): Payer: BC Managed Care – PPO | Admitting: Family Medicine

## 2014-02-23 ENCOUNTER — Encounter: Payer: Self-pay | Admitting: Family Medicine

## 2014-02-23 VITALS — BP 122/78 | HR 71 | Temp 97.7°F | Ht 69.0 in | Wt 193.8 lb

## 2014-02-23 DIAGNOSIS — K5289 Other specified noninfective gastroenteritis and colitis: Secondary | ICD-10-CM

## 2014-02-23 DIAGNOSIS — K529 Noninfective gastroenteritis and colitis, unspecified: Secondary | ICD-10-CM

## 2014-02-23 LAB — CBC WITH DIFFERENTIAL/PLATELET
Basophils Absolute: 0.1 10*3/uL (ref 0.0–0.1)
Basophils Relative: 1 % (ref 0–1)
EOS ABS: 0.8 10*3/uL — AB (ref 0.0–0.7)
EOS PCT: 9 % — AB (ref 0–5)
HCT: 45.1 % (ref 39.0–52.0)
Hemoglobin: 16.1 g/dL (ref 13.0–17.0)
Lymphocytes Relative: 27 % (ref 12–46)
Lymphs Abs: 2.3 10*3/uL (ref 0.7–4.0)
MCH: 30.3 pg (ref 26.0–34.0)
MCHC: 35.7 g/dL (ref 30.0–36.0)
MCV: 84.8 fL (ref 78.0–100.0)
Monocytes Absolute: 0.7 10*3/uL (ref 0.1–1.0)
Monocytes Relative: 8 % (ref 3–12)
Neutro Abs: 4.6 10*3/uL (ref 1.7–7.7)
Neutrophils Relative %: 55 % (ref 43–77)
PLATELETS: 221 10*3/uL (ref 150–400)
RBC: 5.32 MIL/uL (ref 4.22–5.81)
RDW: 13.6 % (ref 11.5–15.5)
WBC: 8.4 10*3/uL (ref 4.0–10.5)

## 2014-02-23 MED ORDER — HYOSCYAMINE SULFATE 0.125 MG SL SUBL: 0.1250 mg | SUBLINGUAL_TABLET | Freq: Four times a day (QID) | SUBLINGUAL | Status: DC | PRN

## 2014-02-23 NOTE — Progress Notes (Signed)
Subjective:    Patient ID: Donald Merritt, male    DOB: [birth year], 28 y.o.   MRN: [id number]  HPI Here for of ER visit on 02/19/14  He went with n/v/d with moderate dehydration  2 L of IV fluid and also nausea and pain control  Does not recall having a fever - but felt hot   Lost 6 lb  His appetite is not great but getting better  Is eating small portions to avoid bloating     Chemistry      Component Value Date/Time   NA 134* 02/19/2014 1131   K 4.9 02/19/2014 1131   CL 96 02/19/2014 1131   CO2 19 02/19/2014 1131   BUN 25* 02/19/2014 1131   CREATININE 1.32 02/19/2014 1131      Component Value Date/Time   CALCIUM 10.0 02/19/2014 1131   ALKPHOS 93 01/31/2013 1128   AST 22 01/31/2013 1128   ALT 44 01/31/2013 1128   BILITOT 0.6 01/31/2013 1128      Lab Results  Component Value Date   WBC 18.3* 02/19/2014   HGB 18.8* 02/19/2014   HCT 52.5* 02/19/2014   MCV 84.8 02/19/2014   PLT 205 02/19/2014     The rest of his family had it too  They all got sick at the same time   He needs refill of hyoscamine - he does not use often   Patient Active Problem List   Diagnosis Date Noted  . Tick bite of back 01/16/2014  . Right low back pain 11/14/2013  . Right flank pain 11/14/2013  . Mid back pain on right side 11/14/2013  . Abdominal pain, unspecified site 01/31/2013  . GERD (gastroesophageal reflux disease) 09/04/2011  . History of kidney stones 03/12/2010  . MICROSCOPIC HEMATURIA 03/12/2010  . FLANK PAIN, RIGHT 03/12/2010  . [place], PERSISTENT 03/15/2008  . ALLERGIC RHINITIS 02/13/2008  . DEPRESSION 04/27/2007   Past Medical History  Diagnosis Date  . Environmental allergies   . Panic attack   . Other and unspecified noninfectious gastroenteritis and colitis(558.9)   . Unspecified sleep apnea   . Other malaise and fatigue   . Tobacco use disorder   . Depression   . Renal stones   . GERD (gastroesophageal reflux disease)    Past Surgical History  Procedure Laterality Date  . Neg  hx     History  Substance Use Topics  . Smoking status: Current Every Day Smoker -- 0.50 packs/day for 15 years    Types: Cigarettes  . Smokeless tobacco: Former Systems developer    Types: Snuff    Quit date: 10/20/2011  . Alcohol Use: Yes     Comment: occaisionally   Family History  Problem Relation Age of Onset  . Asthma Mother   . Hypertension Father   . Heart disease Paternal Grandmother   . Diabetes Paternal Grandmother   . Lactose intolerance Daughter    Allergies  Allergen Reactions  . Chantix [Varenicline]   . Varenicline Tartrate Other (See Comments)    Was in here from 2009, but patient is not aware of any allergies.  . Betadine [Povidone Iodine] Rash   Current Outpatient Prescriptions on File Prior to Visit  Medication Sig Dispense Refill  . fexofenadine-pseudoephedrine (ALLEGRA-D 24) 180-240 MG per 24 hr tablet Take 1 tablet by mouth daily as needed (for allergies).       . hyoscyamine (LEVSIN SL) 0.125 MG SL tablet Place 0.125-0.25 mg under the tongue every 6 (six) hours as  needed for cramping.      . pantoprazole (PROTONIX) 40 MG tablet Take 40 mg by mouth 2 (two) times daily.      . promethazine (PHENERGAN) 25 MG tablet Take 1 tablet (25 mg total) by mouth every 6 (six) hours as needed for nausea or vomiting.  30 tablet  0  . traMADol (ULTRAM) 50 MG tablet Take 1 tablet (50 mg total) by mouth every 6 (six) hours as needed.  15 tablet  0   No current facility-administered medications on file prior to visit.     Review of Systems Review of Systems  Constitutional: Negative for fever, appetite change, fatigue and unexpected weight change.  Eyes: Negative for pain and visual disturbance.  Respiratory: Negative for cough and shortness of breath.   Cardiovascular: Negative for cp or palpitations    Gastrointestinal: Negative for nausea, diarrhea and constipation. pos for occ IBS cramping  Genitourinary: Negative for urgency and frequency.  Skin: Negative for pallor or rash    Neurological: Negative for weakness, light-headedness, numbness and headaches.  Hematological: Negative for adenopathy. Does not bruise/bleed easily.  Psychiatric/Behavioral: Negative for dysphoric mood. The patient is not nervous/anxious.         Objective:   Physical Exam  Constitutional: He appears well-developed and well-nourished. No distress.  HENT:  Head: Normocephalic and atraumatic.  Mouth/Throat: Oropharynx is clear and moist.  Eyes: Conjunctivae and EOM are normal. Pupils are equal, round, and reactive to light. Right eye exhibits no discharge. Left eye exhibits no discharge. No scleral icterus.  Neck: Normal range of motion. Neck supple. No thyromegaly present.  Cardiovascular: Normal rate, regular rhythm, normal heart sounds and intact distal pulses.  Exam reveals no gallop.   No murmur heard. Pulmonary/Chest: Effort normal and breath sounds normal. No respiratory distress. He has no wheezes.  Abdominal: Soft. Bowel sounds are normal. He exhibits no distension and no mass. There is no tenderness. There is no rebound and no guarding.  Musculoskeletal: He exhibits no edema and no tenderness.  Lymphadenopathy:    He has no cervical adenopathy.  Neurological: He is alert. He has normal reflexes.  Skin: Skin is warm and dry. No rash noted. No pallor.  Nl skin color and turgor  Psychiatric: He has a normal mood and affect.          Assessment & Plan:

## 2014-02-23 NOTE — Patient Instructions (Signed)
Labs today to re check your electrolytes and white blood cell count  Make sure to drink lots of fluids  Avoid spicy / heavy food - advance diet slowly If any symptoms return please let me know

## 2014-02-23 NOTE — Assessment & Plan Note (Signed)
Rev ER notes from 5/4  Whole family had the same symptoms - sounds infectious  Better now -disc hydration and gradual adv of diet as tol  Lab today- re check lytes and wbc (which was quite high) Reassuring exam

## 2014-02-23 NOTE — Progress Notes (Signed)
Pre visit review using our clinic review tool, if applicable. No additional management support is needed unless otherwise documented below in the visit note. 

## 2014-02-24 ENCOUNTER — Telehealth: Payer: Self-pay | Admitting: Family Medicine

## 2014-02-24 LAB — BASIC METABOLIC PANEL
BUN: 12 mg/dL (ref 6–23)
CALCIUM: 9.8 mg/dL (ref 8.4–10.5)
CO2: 24 mEq/L (ref 19–32)
CREATININE: 1.01 mg/dL (ref 0.50–1.35)
Chloride: 103 mEq/L (ref 96–112)
GLUCOSE: 78 mg/dL (ref 70–99)
Potassium: 4.2 mEq/L (ref 3.5–5.3)
Sodium: 139 mEq/L (ref 135–145)

## 2014-02-24 NOTE — Telephone Encounter (Signed)
Relevant patient education assigned to patient using Emmi. ° °

## 2014-02-26 ENCOUNTER — Encounter: Payer: Self-pay | Admitting: *Deleted

## 2014-03-05 ENCOUNTER — Telehealth: Payer: Self-pay | Admitting: *Deleted

## 2014-03-05 DIAGNOSIS — Z13220 Encounter for screening for lipoid disorders: Secondary | ICD-10-CM | POA: Insufficient documentation

## 2014-03-05 NOTE — Telephone Encounter (Signed)
Pt's Physician Results form for his insurance requires him to get a cholesterol panel, pt scheduled for labs tomorrow morning, please put in order, and I have his form

## 2014-03-05 NOTE — Telephone Encounter (Signed)
Order done

## 2014-03-06 ENCOUNTER — Other Ambulatory Visit (INDEPENDENT_AMBULATORY_CARE_PROVIDER_SITE_OTHER): Payer: BC Managed Care – PPO

## 2014-03-06 DIAGNOSIS — Z13220 Encounter for screening for lipoid disorders: Secondary | ICD-10-CM

## 2014-03-06 LAB — LIPID PANEL
Cholesterol: 182 mg/dL (ref 0–200)
HDL: 30.7 mg/dL — ABNORMAL LOW (ref >39.00)
LDL Cholesterol: -24 mg/dL — ABNORMAL LOW (ref 0–99)
Total CHOL/HDL Ratio: 6
Triglycerides: 874 mg/dL — ABNORMAL HIGH (ref 0.0–149.0)
VLDL: 174.8 mg/dL — ABNORMAL HIGH (ref 0.0–40.0)

## 2014-03-13 ENCOUNTER — Ambulatory Visit (INDEPENDENT_AMBULATORY_CARE_PROVIDER_SITE_OTHER): Payer: BC Managed Care – PPO | Admitting: Family Medicine

## 2014-03-13 ENCOUNTER — Encounter: Payer: Self-pay | Admitting: Family Medicine

## 2014-03-13 VITALS — BP 136/78 | HR 89 | Temp 97.9°F | Ht 69.0 in | Wt 197.5 lb

## 2014-03-13 DIAGNOSIS — E781 Pure hyperglyceridemia: Secondary | ICD-10-CM

## 2014-03-13 MED ORDER — FENOFIBRATE 48 MG PO TABS: 48.0000 mg | ORAL_TABLET | Freq: Every day | ORAL | Status: DC

## 2014-03-13 NOTE — Progress Notes (Signed)
Pre visit review using our clinic review tool, if applicable. No additional management support is needed unless otherwise documented below in the visit note. 

## 2014-03-13 NOTE — Progress Notes (Signed)
Subjective:    Patient ID: Donald Merritt, male    DOB: 1986/02/15, 28 y.o.   MRN: [id number]  HPI Here for f/u of high triglycerides on a screening lipid panel  Lab Results  Component Value Date   CHOL 182 03/06/2014   Lab Results  Component Value Date   HDL 30.70* 03/06/2014   Lab Results  Component Value Date   LDLCALC -24* 03/06/2014   Lab Results  Component Value Date   TRIG 874.0* 03/06/2014   Lab Results  Component Value Date   CHOLHDL 6 03/06/2014   No results found for this basename: LDLDIRECT    Could not accurately calculate his LDL   He does not regularly exercise  Home with kids - looking for a job   Thinks his father may have high chol- just had MI gmother -high chol with CAD and carotid dz  All on his father's side   Diet -is not generally good  Eats everything - a lot of bad stuff/ sweets/ chips/ snack foods  A little bit of fried chicken  Eats quite a bit of fast food and pizza   Drinks alcohol very rarely   Patient Active Problem List   Diagnosis Date Noted  . Hypertriglyceridemia 03/13/2014  . Screening for lipoid disorders 03/05/2014  . Gastroenteritis 02/23/2014  . Tick bite of back 01/16/2014  . Right low back pain 11/14/2013  . Right flank pain 11/14/2013  . Mid back pain on right side 11/14/2013  . Abdominal pain, unspecified site 01/31/2013  . GERD (gastroesophageal reflux disease) 09/04/2011  . History of kidney stones 03/12/2010  . MICROSCOPIC HEMATURIA 03/12/2010  . FLANK PAIN, RIGHT 03/12/2010  . [place], PERSISTENT 03/15/2008  . ALLERGIC RHINITIS 02/13/2008  . DEPRESSION 04/27/2007   Past Medical History  Diagnosis Date  . Environmental allergies   . Panic attack   . Other and unspecified noninfectious gastroenteritis and colitis(558.9)   . Unspecified sleep apnea   . Other malaise and fatigue   . Tobacco use disorder   . Depression   . Renal stones   . GERD (gastroesophageal reflux disease)    Past Surgical  History  Procedure Laterality Date  . Neg hx     History  Substance Use Topics  . Smoking status: Current Every Day Smoker -- 0.50 packs/day for 15 years    Types: Cigarettes  . Smokeless tobacco: Former Systems developer    Types: Snuff    Quit date: 10/20/2011  . Alcohol Use: Yes     Comment: occaisionally   Family History  Problem Relation Age of Onset  . Asthma Mother   . Hypertension Father   . Heart disease Paternal Grandmother   . Diabetes Paternal Grandmother   . Lactose intolerance Daughter    Allergies  Allergen Reactions  . Chantix [Varenicline]   . Varenicline Tartrate Other (See Comments)    Was in here from 2009, but patient is not aware of any allergies.  . Betadine [Povidone Iodine] Rash   Current Outpatient Prescriptions on File Prior to Visit  Medication Sig Dispense Refill  . fexofenadine-pseudoephedrine (ALLEGRA-D 24) 180-240 MG per 24 hr tablet Take 1 tablet by mouth daily as needed (for allergies).       . pantoprazole (PROTONIX) 40 MG tablet Take 40 mg by mouth 2 (two) times daily.       No current facility-administered medications on file prior to visit.    Review of Systems Review of Systems  Constitutional: Negative for  fever, appetite change, fatigue and unexpected weight change.  Eyes: Negative for pain and visual disturbance.  Respiratory: Negative for cough and shortness of breath.   Cardiovascular: Negative for cp or palpitations    Gastrointestinal: Negative for nausea, diarrhea and constipation.  Genitourinary: Negative for urgency and frequency.  Skin: Negative for pallor or rash   Neurological: Negative for weakness, light-headedness, numbness and headaches.  Hematological: Negative for adenopathy. Does not bruise/bleed easily.  Psychiatric/Behavioral: Negative for dysphoric mood. The patient is not nervous/anxious.         Objective:   Physical Exam  Constitutional: He appears well-developed and well-nourished. No distress.  HENT:  Head:  Normocephalic and atraumatic.  Mouth/Throat: Oropharynx is clear and moist.  Eyes: Conjunctivae and EOM are normal. Pupils are equal, round, and reactive to light.  Neck: Normal range of motion. Neck supple. Carotid bruit is not present.  Cardiovascular: Normal rate, regular rhythm, normal heart sounds and intact distal pulses.  Exam reveals no gallop.   No murmur heard. Pulmonary/Chest: Effort normal and breath sounds normal. He has no rales.  No crackles   Lymphadenopathy:    He has no cervical adenopathy.  Neurological: He is alert.  Skin: Skin is warm and dry. No rash noted. No pallor.  Psychiatric: He has a normal mood and affect.          Assessment & Plan:

## 2014-03-13 NOTE — Patient Instructions (Signed)
Take your fenofibrate once daily  Start eating a low fat diet  Avoid red meat/ fried foods/ egg yolks/ fatty breakfast meats/ butter, cheese and high fat dairy/ and shellfish   Fish with fins are good  Schedule fasting labs in about 6 weeks  I will hold on to your work form for the next labs Think about exercising as well    Fat and Cholesterol Control Diet Fat and cholesterol levels in your blood and organs are influenced by your diet. High levels of fat and cholesterol may lead to diseases of the heart, small and large blood vessels, gallbladder, liver, and pancreas. CONTROLLING FAT AND CHOLESTEROL WITH DIET Although exercise and lifestyle factors are important, your diet is key. That is because certain foods are known to raise cholesterol and others to lower it. The goal is to balance foods for their effect on cholesterol and more importantly, to replace saturated and trans fat with other types of fat, such as monounsaturated fat, polyunsaturated fat, and omega-3 fatty acids. On average, a person should consume no more than 15 to 17 g of saturated fat daily. Saturated and trans fats are considered "bad" fats, and they will raise LDL cholesterol. Saturated fats are primarily found in animal products such as meats, butter, and cream. However, that does not mean you need to give up all your favorite foods. Today, there are good tasting, low-fat, low-cholesterol substitutes for most of the things you like to eat. Choose low-fat or nonfat alternatives. Choose round or loin cuts of red meat. These types of cuts are lowest in fat and cholesterol. Chicken (without the skin), fish, veal, and ground [place] breast are great choices. Eliminate fatty meats, such as hot dogs and salami. Even shellfish have little or no saturated fat. Have a 3 oz (85 g) portion when you eat lean meat, poultry, or fish. Trans fats are also called "partially hydrogenated oils." They are oils that have been scientifically manipulated  so that they are solid at room temperature resulting in a longer shelf life and improved taste and texture of foods in which they are added. Trans fats are found in stick margarine, some tub margarines, cookies, crackers, and baked goods.  When baking and cooking, oils are a great substitute for butter. The monounsaturated oils are especially beneficial since it is believed they lower LDL and raise HDL. The oils you should avoid entirely are saturated tropical oils, such as coconut and palm.  Remember to eat a lot from food groups that are naturally free of saturated and trans fat, including fish, fruit, vegetables, beans, grains (barley, rice, couscous, bulgur wheat), and pasta (without cream sauces).  IDENTIFYING FOODS THAT LOWER FAT AND CHOLESTEROL  Soluble fiber may lower your cholesterol. This type of fiber is found in fruits such as apples, vegetables such as broccoli, potatoes, and carrots, legumes such as beans, peas, and lentils, and grains such as barley. Foods fortified with plant sterols (phytosterol) may also lower cholesterol. You should eat at least 2 g per day of these foods for a cholesterol lowering effect.  Read package labels to identify low-saturated fats, trans fat free, and low-fat foods at the supermarket. Select cheeses that have only 2 to 3 g saturated fat per ounce. Use a heart-healthy tub margarine that is free of trans fats or partially hydrogenated oil. When buying baked goods (cookies, crackers), avoid partially hydrogenated oils. Breads and muffins should be made from whole grains (whole-wheat or whole oat flour, instead of "flour" or "enriched  flour"). Buy non-creamy canned soups with reduced salt and no added fats.  FOOD PREPARATION TECHNIQUES  Never deep-fry. If you must fry, either stir-fry, which uses very little fat, or use non-stick cooking sprays. When possible, broil, bake, or roast meats, and steam vegetables. Instead of putting butter or margarine on vegetables, use  lemon and herbs, applesauce, and cinnamon (for squash and sweet potatoes). Use nonfat yogurt, salsa, and low-fat dressings for salads.  LOW-SATURATED FAT / LOW-FAT FOOD SUBSTITUTES Meats / Saturated Fat (g)  Avoid: Steak, marbled (3 oz/85 g) / 11 g  Choose: Steak, lean (3 oz/85 g) / 4 g  Avoid: Hamburger (3 oz/85 g) / 7 g  Choose: Hamburger, lean (3 oz/85 g) / 5 g  Avoid: Ham (3 oz/85 g) / 6 g  Choose: Ham, lean cut (3 oz/85 g) / 2.4 g  Avoid: Chicken, with skin, dark meat (3 oz/85 g) / 4 g  Choose: Chicken, skin removed, dark meat (3 oz/85 g) / 2 g  Avoid: Chicken, with skin, light meat (3 oz/85 g) / 2.5 g  Choose: Chicken, skin removed, light meat (3 oz/85 g) / 1 g Dairy / Saturated Fat (g)  Avoid: Whole milk (1 cup) / 5 g  Choose: Low-fat milk, 2% (1 cup) / 3 g  Choose: Low-fat milk, 1% (1 cup) / 1.5 g  Choose: Skim milk (1 cup) / 0.3 g  Avoid: Hard cheese (1 oz/28 g) / 6 g  Choose: Skim milk cheese (1 oz/28 g) / 2 to 3 g  Avoid: Cottage cheese, 4% fat (1 cup) / 6.5 g  Choose: Low-fat cottage cheese, 1% fat (1 cup) / 1.5 g  Avoid: Ice cream (1 cup) / 9 g  Choose: Sherbet (1 cup) / 2.5 g  Choose: Nonfat frozen yogurt (1 cup) / 0.3 g  Choose: Frozen fruit bar / trace  Avoid: Whipped cream (1 tbs) / 3.5 g  Choose: Nondairy whipped topping (1 tbs) / 1 g Condiments / Saturated Fat (g)  Avoid: Mayonnaise (1 tbs) / 2 g  Choose: Low-fat mayonnaise (1 tbs) / 1 g  Avoid: Butter (1 tbs) / 7 g  Choose: Extra light margarine (1 tbs) / 1 g  Avoid: Coconut oil (1 tbs) / 11.8 g  Choose: Olive oil (1 tbs) / 1.8 g  Choose: Corn oil (1 tbs) / 1.7 g  Choose: Safflower oil (1 tbs) / 1.2 g  Choose: Sunflower oil (1 tbs) / 1.4 g  Choose: Soybean oil (1 tbs) / 2.4 g  Choose: Canola oil (1 tbs) / 1 g Document Released: 10/05/2005 Document Revised: 01/30/2013 Document Reviewed: 03/26/2011 ExitCare Patient Information 2014 [place], [place].

## 2014-03-15 NOTE — Assessment & Plan Note (Signed)
susp hereditary but diet could improve  Rev low fat diet and handouts given  Trial of low dose fenofibrate -disc poss side eff in detail  Re check planned  Will tirtrate dose up if needed

## 2014-04-24 ENCOUNTER — Other Ambulatory Visit (INDEPENDENT_AMBULATORY_CARE_PROVIDER_SITE_OTHER): Payer: BC Managed Care – PPO

## 2014-04-24 DIAGNOSIS — E781 Pure hyperglyceridemia: Secondary | ICD-10-CM

## 2014-04-24 LAB — LIPID PANEL
CHOL/HDL RATIO: 5
CHOLESTEROL: 177 mg/dL (ref 0–200)
HDL: 34.3 mg/dL — ABNORMAL LOW (ref >39.00)
NonHDL: 142.7
VLDL: 96.4 mg/dL — AB (ref 0.0–40.0)

## 2014-04-24 LAB — AST: AST: 24 U/L (ref 0–37)

## 2014-04-24 LAB — ALT: ALT: 42 U/L (ref 0–53)

## 2014-04-26 ENCOUNTER — Telehealth: Payer: Self-pay | Admitting: Family Medicine

## 2014-04-26 MED ORDER — FENOFIBRATE 145 MG PO TABS: 145.0000 mg | ORAL_TABLET | Freq: Every day | ORAL | Status: DC

## 2014-04-26 NOTE — Telephone Encounter (Signed)
Take 3 of his current pills once daily The new strength when he picks it up will be for 145 mg once daily Please make lab appt for fasting lipid/ast/alt in about 6 weeks  thanks

## 2014-04-26 NOTE — Telephone Encounter (Signed)
Message copied by Abner Greenspan on Thu Apr 26, 2014 10:06 PM ------      Message from: Tammi Sou      Created: Thu Apr 26, 2014  4:00 PM       Pt notified of lab and Dr. Marliss Coots comments. Lab appt scheduled for 06/05/14 to recheck lipid ast/alt.            Pt said he is okay with increasing his does of Fenofibrate. Pt uses [facility]. Pt said he just got his fenofibrate refilled and wanted to know how to take the medication he has now at an increase does before he would pick up a new Rx from pharmacy, please advise ------

## 2014-04-27 NOTE — Telephone Encounter (Signed)
Pt notified to take 3 pills once daily until he runs out then pick up new dose of fenofibrate 145mg , and lab appt scheduled

## 2014-06-05 ENCOUNTER — Other Ambulatory Visit: Payer: BC Managed Care – PPO

## 2014-06-07 ENCOUNTER — Telehealth: Payer: Self-pay | Admitting: Family Medicine

## 2014-06-07 DIAGNOSIS — E781 Pure hyperglyceridemia: Secondary | ICD-10-CM

## 2014-06-07 NOTE — Telephone Encounter (Signed)
Message copied by Abner Greenspan on Thu Jun 07, 2014  4:05 PM ------      Message from: Ellamae Sia      Created: Tue Jun 05, 2014  4:22 PM      Regarding: Lab orders for Friday, 8.28.15       Lab orders, no f/u appt ------

## 2014-06-15 ENCOUNTER — Other Ambulatory Visit (INDEPENDENT_AMBULATORY_CARE_PROVIDER_SITE_OTHER): Payer: BC Managed Care – PPO

## 2014-06-15 DIAGNOSIS — E781 Pure hyperglyceridemia: Secondary | ICD-10-CM

## 2014-06-15 LAB — ALT: ALT: 26 U/L (ref 0–53)

## 2014-06-15 LAB — LIPID PANEL
Cholesterol: 133 mg/dL (ref 0–200)
HDL: 29.3 mg/dL — AB (ref >39.00)
LDL CALC: 65 mg/dL (ref 0–99)
NonHDL: 103.7
Total CHOL/HDL Ratio: 5
Triglycerides: 192 mg/dL — ABNORMAL HIGH (ref 0.0–149.0)
VLDL: 38.4 mg/dL (ref 0.0–40.0)

## 2014-06-15 LAB — AST: AST: 21 U/L (ref 0–37)

## 2014-06-19 ENCOUNTER — Encounter: Payer: Self-pay | Admitting: *Deleted

## 2015-01-09 ENCOUNTER — Other Ambulatory Visit: Payer: Self-pay | Admitting: Family Medicine

## 2015-01-30 ENCOUNTER — Encounter: Payer: Self-pay | Admitting: Family Medicine

## 2015-01-30 ENCOUNTER — Ambulatory Visit (INDEPENDENT_AMBULATORY_CARE_PROVIDER_SITE_OTHER): Payer: BLUE CROSS/BLUE SHIELD | Admitting: Family Medicine

## 2015-01-30 VITALS — BP 104/80 | HR 77 | Temp 98.3°F | Ht 69.0 in | Wt 194.8 lb

## 2015-01-30 DIAGNOSIS — S29019A Strain of muscle and tendon of unspecified wall of thorax, initial encounter: Secondary | ICD-10-CM | POA: Diagnosis not present

## 2015-01-30 DIAGNOSIS — S39012A Strain of muscle, fascia and tendon of lower back, initial encounter: Secondary | ICD-10-CM | POA: Diagnosis not present

## 2015-01-30 DIAGNOSIS — S29012A Strain of muscle and tendon of back wall of thorax, initial encounter: Secondary | ICD-10-CM

## 2015-01-30 MED ORDER — TIZANIDINE HCL 4 MG PO TABS: 4.0000 mg | ORAL_TABLET | Freq: Every evening | ORAL | Status: DC

## 2015-01-30 MED ORDER — MELOXICAM 15 MG PO TABS: 15.0000 mg | ORAL_TABLET | Freq: Every day | ORAL | Status: DC

## 2015-01-30 NOTE — Progress Notes (Signed)
Dr. Frederico Hamman T. Pharaoh Pio, MD, [facility] [place], [address] Phone: [phone] Fax: [phone]  01/30/2015  Patient: Donald Merritt, MRN: [id number], DOB: 04-07-1986, 29 y.o.  Primary Physician:  Loura Pardon, MD  Chief Complaint: Back Pain  Subjective:   Donald Merritt is a 29 y.o. very pleasant male patient who presents with the following: Back Pain  ongoing for approximately: 01/18/2015 The patient has had back pain before. The back pain is localized into the thoracic and lumbar spine area. They also describe no radiculopathy.  2 weeks left posterior back pain and burning into stomach.  H/o kidney stone in the past.  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   No numbness or tingling. No bowel or bladder incontinence. No focal weakness. Prior interventions: heat, nsaids Physical therapy: No Chiropractic manipulations: No Acupuncture: No Osteopathic manipulation: No Heat or cold: helped some  Past Medical History, Surgical History, Family History, Medications, Allergies have been reviewed and updated if relevant.  GEN: No fevers, chills. Nontoxic. Primarily MSK c/o today. MSK: Detailed in the HPI GI: tolerating PO intake without difficulty Neuro: As above  Otherwise the pertinent positives of the ROS are noted above.    Objective:   Blood pressure 104/80, pulse 77, temperature 98.3 F (36.8 C), temperature source Oral, height 5\' 9"  (1.753 m), weight 194 lb 12 oz (88.338 kg).  Gen: Well-developed,well-nourished,in no acute distress; alert,appropriate and cooperative throughout examination HEENT: Normocephalic and atraumatic without obvious abnormalities.  Ears, externally no deformities Pulm: Breathing comfortably in no respiratory distress Range of motion at  the waist: Flexion, rotation and lateral bending: able to touch knees, pain with ext, lateral movement normal  No echymosis or edema Rises  to examination table with no difficulty Gait: minimally antalgic  Inspection/Deformity: No abnormality Paraspinus T:  Much t5-l2  B Ankle Dorsiflexion (L5,4): 5/5 B Great Toe Dorsiflexion (L5,4): 5/5 Heel Walk (L5): WNL Toe Walk (S1): WNL Rise/Squat (L4): WNL, mild pain  SENSORY B Medial Foot (L4): WNL B Dorsum (L5): WNL B Lateral (S1): WNL Light Touch: WNL Pinprick: WNL  REFLEXES Knee (L4): 2+ Ankle (S1): 2+  B SLR, seated: neg B SLR, supine: neg B FABER: neg B Reverse FABER: neg B Greater Troch: NT B Log Roll: neg B Stork: NT B Sciatic Notch: NT  Radiology: No results found.  Assessment and Plan:   Strain of thoracic paraspinal muscles excluding T1 and T2 levels, initial encounter  Strain of lumbar region, initial encounter  Anatomy reviewed. Conservative algorithms for acute back pain generally begin with the following: NSAIDS, Muscle Relaxants, Mild pain medication  Start with medications, core rehab, and progress from there following low back pain algorithm. No red flags are present.  Work note x 2 days  Follow-up: prn  New Prescriptions   MELOXICAM (MOBIC) 15 MG TABLET    Take 1 tablet (15 mg total) by mouth daily.   TIZANIDINE (ZANAFLEX) 4 MG TABLET    Take 1 tablet (4 mg total) by mouth Nightly.   No orders of the defined types were placed in this encounter.    Signed,  Maud Deed. Bhakti Labella, MD   Patient's Medications  New Prescriptions   MELOXICAM (MOBIC) 15 MG TABLET    Take 1 tablet (15 mg total) by mouth daily.   TIZANIDINE (ZANAFLEX) 4 MG TABLET    Take 1 tablet (4 mg total) by mouth Nightly.  Previous Medications  FEXOFENADINE-PSEUDOEPHEDRINE (ALLEGRA-D 24) 180-240 MG PER 24 HR TABLET    Take 1 tablet by mouth daily as needed (for allergies).    HYOSCYAMINE (LEVSIN/SL) 0.125 MG SL TABLET    Place under the tongue every 6 (six) hours as needed. Place 1-2 tablets under the tongue every 6 hours prn for cramping   PANTOPRAZOLE (PROTONIX) 40  MG TABLET    TAKE 1 TABLET (40 MG TOTAL) BY MOUTH 2 (TWO) TIMES DAILY.  Modified Medications   No medications on file  Discontinued Medications   FENOFIBRATE (TRICOR) 145 MG TABLET    Take 1 tablet (145 mg total) by mouth daily.   PANTOPRAZOLE (PROTONIX) 40 MG TABLET    Take 40 mg by mouth 2 (two) times daily.

## 2015-01-30 NOTE — Progress Notes (Signed)
Pre visit review using our clinic review tool, if applicable. No additional management support is needed unless otherwise documented below in the visit note. 

## 2015-05-12 ENCOUNTER — Other Ambulatory Visit: Payer: Self-pay | Admitting: Family Medicine

## 2015-05-13 NOTE — Telephone Encounter (Signed)
Please schedule f/u and refill until then  

## 2015-05-13 NOTE — Telephone Encounter (Signed)
Electronic refill request, pt hasn't been seen by Dr. Glori Bickers in over a year, please advise

## 2015-05-14 ENCOUNTER — Telehealth: Payer: Self-pay | Admitting: Family Medicine

## 2015-05-14 ENCOUNTER — Emergency Department (HOSPITAL_COMMUNITY)
Admission: EM | Admit: 2015-05-14 | Discharge: 2015-05-14 | Disposition: A | Discharge status: Home / Self Care | Payer: BLUE CROSS/BLUE SHIELD | Attending: Emergency Medicine | Admitting: Emergency Medicine

## 2015-05-14 DIAGNOSIS — Z79899 Other long term (current) drug therapy: Secondary | ICD-10-CM | POA: Diagnosis not present

## 2015-05-14 DIAGNOSIS — Z8669 Personal history of other diseases of the nervous system and sense organs: Secondary | ICD-10-CM | POA: Insufficient documentation

## 2015-05-14 DIAGNOSIS — R1013 Epigastric pain: Secondary | ICD-10-CM | POA: Diagnosis present

## 2015-05-14 DIAGNOSIS — K279 Peptic ulcer, site unspecified, unspecified as acute or chronic, without hemorrhage or perforation: Secondary | ICD-10-CM | POA: Insufficient documentation

## 2015-05-14 DIAGNOSIS — R110 Nausea: Secondary | ICD-10-CM | POA: Diagnosis not present

## 2015-05-14 DIAGNOSIS — M545 Low back pain: Secondary | ICD-10-CM | POA: Insufficient documentation

## 2015-05-14 DIAGNOSIS — Z720 Tobacco use: Secondary | ICD-10-CM | POA: Diagnosis not present

## 2015-05-14 DIAGNOSIS — Z8659 Personal history of other mental and behavioral disorders: Secondary | ICD-10-CM | POA: Diagnosis not present

## 2015-05-14 DIAGNOSIS — R300 Dysuria: Secondary | ICD-10-CM | POA: Insufficient documentation

## 2015-05-14 DIAGNOSIS — K219 Gastro-esophageal reflux disease without esophagitis: Secondary | ICD-10-CM | POA: Insufficient documentation

## 2015-05-14 LAB — COMPREHENSIVE METABOLIC PANEL
ALT: 36 U/L (ref 17–63)
AST: 28 U/L (ref 15–41)
Albumin: 4.8 g/dL (ref 3.5–5.0)
Alkaline Phosphatase: 98 U/L (ref 38–126)
Anion gap: 10 (ref 5–15)
BILIRUBIN TOTAL: 1.2 mg/dL (ref 0.3–1.2)
BUN: 11 mg/dL (ref 6–20)
CHLORIDE: 102 mmol/L (ref 101–111)
CO2: 25 mmol/L (ref 22–32)
Calcium: 9.8 mg/dL (ref 8.9–10.3)
Creatinine, Ser: 1.26 mg/dL — ABNORMAL HIGH (ref 0.61–1.24)
GFR calc Af Amer: >60 mL/min (ref >60)
Glucose, Bld: 102 mg/dL — ABNORMAL HIGH (ref 65–99)
Potassium: 5 mmol/L (ref 3.5–5.1)
SODIUM: 137 mmol/L (ref 135–145)
TOTAL PROTEIN: 7.7 g/dL (ref 6.5–8.1)

## 2015-05-14 LAB — URINALYSIS, ROUTINE W REFLEX MICROSCOPIC
Bilirubin Urine: NEGATIVE
Glucose, UA: NEGATIVE mg/dL
Hgb urine dipstick: NEGATIVE
KETONES UR: 15 mg/dL — AB
Leukocytes, UA: NEGATIVE
Nitrite: NEGATIVE
PROTEIN: NEGATIVE mg/dL
Specific Gravity, Urine: 1.015 (ref 1.005–1.030)
UROBILINOGEN UA: 0.2 mg/dL (ref 0.0–1.0)
pH: 6.5 (ref 5.0–8.0)

## 2015-05-14 LAB — CBC
HEMATOCRIT: 48.8 % (ref 39.0–52.0)
Hemoglobin: 17.3 g/dL — ABNORMAL HIGH (ref 13.0–17.0)
MCH: 30.3 pg (ref 26.0–34.0)
MCHC: 35.5 g/dL (ref 30.0–36.0)
MCV: 85.5 fL (ref 78.0–100.0)
Platelets: 212 10*3/uL (ref 150–400)
RBC: 5.71 MIL/uL (ref 4.22–5.81)
RDW: 12.7 % (ref 11.5–15.5)
WBC: 12.1 10*3/uL — ABNORMAL HIGH (ref 4.0–10.5)

## 2015-05-14 LAB — LIPASE, BLOOD: LIPASE: 16 U/L — AB (ref 22–51)

## 2015-05-14 MED ORDER — ALUM & MAG HYDROXIDE-SIMETH 200-200-20 MG/5ML PO SUSP
30.0000 mL | Freq: Once | ORAL | Status: AC
  Administered 2015-05-14: 30 mL via ORAL
  Filled 2015-05-14: qty 30

## 2015-05-14 MED ORDER — LIDOCAINE VISCOUS 2 % MT SOLN
15.0000 mL | Freq: Once | OROMUCOSAL | Status: AC
  Administered 2015-05-14: 15 mL via OROMUCOSAL
  Filled 2015-05-14: qty 15

## 2015-05-14 MED ORDER — SUCRALFATE 1 GM/10ML PO SUSP: 1.0000 g | Freq: Three times a day (TID) | ORAL | Status: DC

## 2015-05-14 NOTE — ED Notes (Signed)
Pt. Stated, I've been having mid center stomach pain since yesterday, but Im having back pain since March.  Yesterday i threw up , but I have stomach issue and have to take Protonix for that.  Dr. Glori Bickers is my Primary care doctor.

## 2015-05-14 NOTE — Discharge Instructions (Signed)
Food Choices for Peptic Ulcer Disease  When you have peptic ulcer disease, the foods you eat and your eating habits are very important. Choosing the right foods can help ease the discomfort of peptic ulcer disease.  WHAT GENERAL GUIDELINES DO I NEED TO FOLLOW?  · Choose fruits, vegetables, whole grains, and low-fat meat, fish, and poultry.    · Keep a food diary to identify foods that cause symptoms.  · Avoid foods that cause irritation or pain. These may be different for different people.  · Eat frequent small meals instead of three large meals each day. The pain may be worse when your stomach is empty.   · Avoid eating close to bedtime.  WHAT FOODS ARE NOT RECOMMENDED?  The following are some foods and drinks that may worsen your symptoms:  · Black, white, and red pepper.  · Hot sauce.  · Chili peppers.  · Chili powder.  · Chocolate and cocoa.     · Alcohol.  · Tea, coffee, and cola (regular and decaffeinated).  The items listed above may not be a complete list of foods and beverages to avoid. Contact your dietitian for more information.  Document Released: 12/28/2011 Document Revised: 10/10/2013 Document Reviewed: 08/09/2013  ExitCare® Patient Information ©2015 [employer]. This information is not intended to replace advice given to you by your health care provider. Make sure you discuss any questions you have with your health care provider.

## 2015-05-14 NOTE — Telephone Encounter (Signed)
Please see if he is reachable to check in -? If they had the correct phone number

## 2015-05-14 NOTE — ED Notes (Signed)
MD at bedside. 

## 2015-05-14 NOTE — Telephone Encounter (Signed)
Patient Name: Donald Merritt DOB: [birth year] Initial Comment Caller states stomach has been bloated and back has been hurting. Nurse Assessment Guidelines Guideline Title Affirmed Question Affirmed Notes Final Disposition User FINAL ATTEMPT MADE - no message left [place], Therapist, sports, Kermit Balo

## 2015-05-14 NOTE — Telephone Encounter (Signed)
Called # on file and no answer so left voicemail requesting pt to call office

## 2015-05-14 NOTE — ED Provider Notes (Signed)
CSN: [id number]     Arrival date & time 05/14/15  1235 History   First MD Initiated Contact with Patient 05/14/15 1549     Chief Complaint  Patient presents with  . Abdominal Pain  . Back Pain     (Consider location/radiation/quality/duration/timing/severity/associated sxs/prior Treatment) Patient is a 29 y.o. male presenting with abdominal pain. The history is provided by the patient. No language interpreter was used.  Abdominal Pain Pain location:  Epigastric Pain quality: gnawing, heavy and pressure   Pain radiates to:  Does not radiate Pain severity:  Moderate Onset quality:  Gradual Timing:  Intermittent Progression:  Waxing and waning Chronicity:  New Context: eating   Context: not alcohol use, not medication withdrawal, not previous surgeries, not recent illness, not recent travel and not trauma   Relieved by:  Nothing Ineffective treatments:  None tried Associated symptoms: dysuria and nausea   Associated symptoms: no anorexia, no chest pain, no constipation, no cough, no diarrhea, no fatigue, no fever, no shortness of breath and no vomiting   Risk factors: no NSAID use and no recent hospitalization     Past Medical History  Diagnosis Date  . Environmental allergies   . Panic attack   . Other and unspecified noninfectious gastroenteritis and colitis(558.9)   . Unspecified sleep apnea   . Other malaise and fatigue   . Tobacco use disorder   . Depression   . Renal stones   . GERD (gastroesophageal reflux disease)    Past Surgical History  Procedure Laterality Date  . Neg hx     Family History  Problem Relation Age of Onset  . Asthma Mother   . Hypertension Father   . Heart disease Paternal Grandmother   . Diabetes Paternal Grandmother   . Lactose intolerance Daughter    History  Substance Use Topics  . Smoking status: Current Every Day Smoker -- 0.50 packs/day for 15 years    Types: Cigarettes  . Smokeless tobacco: Former Systems developer    Types: Snuff    Quit  date: 10/20/2011  . Alcohol Use: Yes     Comment: occaisionally    Review of Systems  Constitutional: Negative for fever and fatigue.  Respiratory: Negative for cough, chest tightness and shortness of breath.   Cardiovascular: Negative for chest pain.  Gastrointestinal: Positive for nausea and abdominal pain. Negative for vomiting, diarrhea, constipation and anorexia.  Genitourinary: Positive for dysuria.  Neurological: Negative for light-headedness and headaches.  Psychiatric/Behavioral: Negative for confusion.  All other systems reviewed and are negative.     Allergies  Chantix and Betadine  Home Medications   Prior to Admission medications   Medication Sig Start Date End Date Taking? Authorizing Provider  fexofenadine-pseudoephedrine (ALLEGRA-D 24) 180-240 MG per 24 hr tablet Take 1 tablet by mouth daily as needed (for allergies).     Historical Provider, MD  hyoscyamine (LEVSIN/SL) 0.125 MG SL tablet Place under the tongue every 6 (six) hours as needed. Place 1-2 tablets under the tongue every 6 hours prn for cramping    Historical Provider, MD  meloxicam (MOBIC) 15 MG tablet Take 1 tablet (15 mg total) by mouth daily. 01/30/15   Spencer Copland, MD  pantoprazole (PROTONIX) 40 MG tablet TAKE 1 TABLET (40 MG TOTAL) BY MOUTH 2 (TWO) TIMES DAILY. 01/10/15   Abner Greenspan, MD  tiZANidine (ZANAFLEX) 4 MG tablet Take 1 tablet (4 mg total) by mouth Nightly. 01/30/15   Spencer Copland, MD   BP 162/95 mmHg  Pulse 94  Temp(Src) 98.2 F (36.8 C) (Oral)  Resp 17  Ht 5\' 9"  (1.753 m)  Wt 187 lb 6 oz (84.993 kg)  BMI 27.66 kg/m2  SpO2 97% Physical Exam  Constitutional: He is oriented to person, place, and time. He appears well-developed and well-nourished. No distress.  HENT:  Head: Normocephalic and atraumatic.  Nose: Nose normal.  Mouth/Throat: Oropharynx is clear and moist. No oropharyngeal exudate.  Eyes: EOM are normal. Pupils are equal, round, and reactive to light.  Neck:  Normal range of motion. Neck supple.  Cardiovascular: Normal rate, regular rhythm, normal heart sounds and intact distal pulses.   No murmur heard. Pulmonary/Chest: Effort normal and breath sounds normal. No respiratory distress. He has no wheezes. He exhibits no tenderness.  Abdominal: Soft. He exhibits no distension. There is tenderness. There is no guarding.  Mild epigastric tenderness to palpation.  No rebound or guarding.  No CVA tenderness b/l.    Musculoskeletal: Normal range of motion. He exhibits tenderness.  No midline C/T/L spine midline tenderness, mild left lateral lower back muscle tenderness.    Neurological: He is alert and oriented to person, place, and time. No cranial nerve deficit. Coordination normal.  Skin: Skin is warm and dry. He is not diaphoretic. No pallor.  Psychiatric: He has a normal mood and affect. His behavior is normal. Judgment and thought content normal.  Nursing note and vitals reviewed.   ED Course  Procedures (including critical care time) Labs Review Labs Reviewed  LIPASE, BLOOD - Abnormal; Notable for the following:    Lipase 16 (*)    All other components within normal limits  COMPREHENSIVE METABOLIC PANEL - Abnormal; Notable for the following:    Glucose, Bld 102 (*)    Creatinine, Ser 1.26 (*)    All other components within normal limits  CBC - Abnormal; Notable for the following:    WBC 12.1 (*)    Hemoglobin 17.3 (*)    All other components within normal limits  URINALYSIS, ROUTINE W REFLEX MICROSCOPIC (NOT AT [facility]) - Abnormal; Notable for the following:    Ketones, ur 15 (*)    All other components within normal limits    Imaging Review No results found.   EKG Interpretation None      MDM   Final diagnoses:  PUD (peptic ulcer disease)  Epigastric pain   Pt is a 29 yo M with hx of GERD and chronic back pain who presents with epigastric pain. Complains of 2 days of intermittent gnawing and pressure like epigastric abd pain.   Associated nausea but no vomiting.  Pain improves slightly after eating then increases slowly after. Has been seen by GI in the past, with EGD done several years ago.  Treated with PPI daily.  Afebrile, no diarrhea, no myalgias.  No hx of GB pathology or pancreatitis in the past.  Denies significant EtOH use.  Has been having increased left lateral lower back pain as well, consistent with his typical back pain associated with his known lumbar strain.  Has been seen several times by PCP for that back pain, treated with PRN pain meds.   ROS also + for intermittent dysuria, so will get UA today to evaluate.     Looks well.  Mild epigastric tenderness to palpation, no rigidity.  Lateral back muscle tenderness, no CVA tenderness, no midline spine tenderness.   Labs reassuring.  Lipase 16.  LFTs benign.  Mild bump in Cr (1.26 from baseline around 1).   Story and  exam is consistent with PUD.  Given symptomatic treatment with viscous lidocaine and maalox and patient felt significant improvement.  Encouraged good PO fluid intake.  Advised to continue his PPI daily and given Rx for carafate.  Encouraged to f/u with PCP if sx don't improve over the next few days.     Labs were reviewed and interpreted by myself and my attending, and incorporated in the medical decision making.  Patient was seen with ED Attending, Dr. Burnard Leigh, MD     Tori Milks, MD 05/15/15 [place], MD 05/17/15 [phone]

## 2015-05-14 NOTE — Telephone Encounter (Signed)
Left voicemail requesting pt to call office back 

## 2015-05-14 NOTE — Telephone Encounter (Signed)
PLEASE NOTE: All timestamps contained within this report are represented as [place] Standard Time. CONFIDENTIALTY NOTICE: This fax transmission is intended only for the addressee. It contains information that is legally privileged, confidential or otherwise protected from use or disclosure. If you are not the intended recipient, you are strictly prohibited from reviewing, disclosing, copying using or disseminating any of this information or taking any action in reliance on or regarding this information. If you have received this fax in error, please notify us immediately by telephone so that we can arrange for its return to [place]. Phone: [phone], Toll-Free: [phone], Fax: [phone] Page: 1 of 1 Call Id: [id number] [place] Patient Name: Donald Merritt Gender: Male DOB: 26-Jan-1986 Age: 29 Y 67 M 7 D Return Phone Number: [phone] (Primary) Address: City/State/Zip: [place] Client [facility] - Client Client Site [place], [place] Contact Type Call Call Type Triage / Clinical Relationship To Patient Self Appointment Disposition EMR Caller Not Reached Info pasted into Epic Yes Return Phone Number [phone] (Primary) Chief Complaint Abdominal Distention / Mass Initial Comment Caller states stomach has been bloated and back has been hurting. Nurse Assessment Guidelines Guideline Title Affirmed Question Affirmed Notes Nurse Date/Time (Eastern Time) Disp. Time Eilene Ghazi Time) Disposition Final User 05/14/2015 12:37:20 PM Attempt made - message left Harlon Ditty 05/14/2015 12:54:25 PM Attempt made - no message left Marcelline Deist, RN, Kermit Balo 05/14/2015 1:07:49 PM FINAL ATTEMPT MADE - no message left Yes Marcelline Deist, RN, Kermit Balo After Care Instructions Given Call Event Type User Date / Time Description

## 2015-05-15 ENCOUNTER — Ambulatory Visit (INDEPENDENT_AMBULATORY_CARE_PROVIDER_SITE_OTHER): Payer: BLUE CROSS/BLUE SHIELD | Admitting: Family Medicine

## 2015-05-15 ENCOUNTER — Encounter: Payer: Self-pay | Admitting: Family Medicine

## 2015-05-15 VITALS — BP 130/77 | HR 96 | Temp 97.7°F | Ht 69.0 in | Wt 185.8 lb

## 2015-05-15 DIAGNOSIS — K2970 Gastritis, unspecified, without bleeding: Secondary | ICD-10-CM | POA: Insufficient documentation

## 2015-05-15 NOTE — Telephone Encounter (Signed)
Pt was seen in ER yesterday and scheduled a ER f/u with you today

## 2015-05-15 NOTE — Telephone Encounter (Signed)
Pt was seen for a hospital f/u appt. today

## 2015-05-15 NOTE — Progress Notes (Signed)
Pre visit review using our clinic review tool, if applicable. No additional management support is needed unless otherwise documented below in the visit note. 

## 2015-05-15 NOTE — Assessment & Plan Note (Signed)
In a smoker with hx of gerd (not compliant with protonix) who smokes/has stress and has been taking aleve for HA Rev ED info Inc cr indicates dehydration- could cause ha, inst to inc water intake and stop soda Stop all nsaid  Take carafate  Take protonix bid Work on smoking cessation Update if worse or if any dark stool  F/u 4-6 wk for visit and lab

## 2015-05-15 NOTE — Telephone Encounter (Signed)
I will see him then

## 2015-05-15 NOTE — Patient Instructions (Signed)
Stop sodas  Stop aleve (tylenol is ok) Drink lots of water- you are dehydrated - this will help headache  Work on smoking  Increase protonix to 1 pill twice daily  Continue carafate as needed   Follow up in 4-6 weeks   If worse -please let me know

## 2015-05-15 NOTE — Progress Notes (Signed)
Subjective:    Patient ID: Donald Merritt, male    DOB: 04-19-1986, 29 y.o.   MRN: [id number]  HPI Was in ED yesterday - with epigastric pain   Results for orders placed or performed during the hospital encounter of 05/14/15  Lipase, blood  Result Value Ref Range   Lipase 16 (L) 22 - 51 U/L  Comprehensive metabolic panel  Result Value Ref Range   Sodium 137 135 - 145 mmol/L   Potassium 5.0 3.5 - 5.1 mmol/L   Chloride 102 101 - 111 mmol/L   CO2 25 22 - 32 mmol/L   Glucose, Bld 102 (H) 65 - 99 mg/dL   BUN 11 6 - 20 mg/dL   Creatinine, Ser 1.26 (H) 0.61 - 1.24 mg/dL   Calcium 9.8 8.9 - 10.3 mg/dL   Total Protein 7.7 6.5 - 8.1 g/dL   Albumin 4.8 3.5 - 5.0 g/dL   AST 28 15 - 41 U/L   ALT 36 17 - 63 U/L   Alkaline Phosphatase 98 38 - 126 U/L   Total Bilirubin 1.2 0.3 - 1.2 mg/dL   GFR calc non Af Amer >60 >60 mL/min   GFR calc Af Amer >60 >60 mL/min   Anion gap 10 5 - 15  CBC  Result Value Ref Range   WBC 12.1 (H) 4.0 - 10.5 K/uL   RBC 5.71 4.22 - 5.81 MIL/uL   Hemoglobin 17.3 (H) 13.0 - 17.0 g/dL   HCT 48.8 39.0 - 52.0 %   MCV 85.5 78.0 - 100.0 fL   MCH 30.3 26.0 - 34.0 pg   MCHC 35.5 30.0 - 36.0 g/dL   RDW 12.7 11.5 - 15.5 %   Platelets 212 150 - 400 K/uL  Urinalysis, Routine w reflex microscopic (not at [facility])  Result Value Ref Range   Color, Urine YELLOW YELLOW   APPearance CLEAR CLEAR   Specific Gravity, Urine 1.015 1.005 - 1.030   pH 6.5 5.0 - 8.0   Glucose, UA NEGATIVE NEGATIVE mg/dL   Hgb urine dipstick NEGATIVE NEGATIVE   Bilirubin Urine NEGATIVE NEGATIVE   Ketones, ur 15 (A) NEGATIVE mg/dL   Protein, ur NEGATIVE NEGATIVE mg/dL   Urobilinogen, UA 0.2 0.0 - 1.0 mg/dL   Nitrite NEGATIVE NEGATIVE   Leukocytes, UA NEGATIVE NEGATIVE     Planning to quit smoking   Given a GI cocktail (lidocaine and antacid)  That helped some   Given px for carafate  On protonix - once daily (supposed to be on bid )  EGD in the past  Never had a bleeding ulcer   No  dark stool or blood in stool   Today- improved but not better  - calming down   Drinks soda  Smokes A lot of stress (does not need counseling)  ? All contrib to gastritis or ulcer   A lot of headaches lately as well  Taking aleve - ? Causing this   BP Readings from Last 3 Encounters:  05/15/15 148/90  05/14/15 128/79  01/30/15 104/80  re check 130/77   Patient Active Problem List   Diagnosis Date Noted  . Gastritis 05/15/2015  . Hypertriglyceridemia 03/13/2014  . Screening for lipoid disorders 03/05/2014  . Gastroenteritis 02/23/2014  . Tick bite of back 01/16/2014  . Right low back pain 11/14/2013  . Right flank pain 11/14/2013  . Mid back pain on right side 11/14/2013  . Abdominal pain, unspecified site 01/31/2013  . GERD (gastroesophageal reflux disease) 09/04/2011  .  History of kidney stones 03/12/2010  . MICROSCOPIC HEMATURIA 03/12/2010  . FLANK PAIN, RIGHT 03/12/2010  . [place], PERSISTENT 03/15/2008  . ALLERGIC RHINITIS 02/13/2008  . DEPRESSION 04/27/2007   Past Medical History  Diagnosis Date  . Environmental allergies   . Panic attack   . Other and unspecified noninfectious gastroenteritis and colitis(558.9)   . Unspecified sleep apnea   . Other malaise and fatigue   . Tobacco use disorder   . Depression   . Renal stones   . GERD (gastroesophageal reflux disease)    Past Surgical History  Procedure Laterality Date  . Neg hx     History  Substance Use Topics  . Smoking status: Current Every Day Smoker -- 0.50 packs/day for 15 years    Types: Cigarettes  . Smokeless tobacco: Former Systems developer    Types: Snuff    Quit date: 10/20/2011  . Alcohol Use: 0.0 oz/week    0 Standard drinks or equivalent per week     Comment: occaisionally   Family History  Problem Relation Age of Onset  . Asthma Mother   . Hypertension Father   . Heart disease Paternal Grandmother   . Diabetes Paternal Grandmother   . Lactose intolerance Daughter    Allergies    Allergen Reactions  . Chantix [Varenicline]   . Betadine [Povidone Iodine] Rash   Current Outpatient Prescriptions on File Prior to Visit  Medication Sig Dispense Refill  . fexofenadine-pseudoephedrine (ALLEGRA-D 24) 180-240 MG per 24 hr tablet Take 1 tablet by mouth daily.     . hyoscyamine (LEVSIN/SL) 0.125 MG SL tablet Place under the tongue every 6 (six) hours as needed. Place 1-2 tablets under the tongue every 6 hours prn for cramping    . sucralfate (CARAFATE) 1 GM/10ML suspension Take 10 mLs (1 g total) by mouth 4 (four) times daily -  with meals and at bedtime. 420 mL 0  . pantoprazole (PROTONIX) 40 MG tablet TAKE 1 TABLET BY MOUTH TWICE A DAY 180 tablet 0   No current facility-administered medications on file prior to visit.    . Review of Systems    Review of Systems  Constitutional: Negative for fever, appetite change, fatigue and unexpected weight change.  Eyes: Negative for pain and visual disturbance.  Respiratory: Negative for cough and shortness of breath.   Cardiovascular: Negative for cp or palpitations    Gastrointestinal: Negative for nausea, diarrhea and constipation. pos for epigastric pain , neg for dark stool or blood in stool Genitourinary: Negative for urgency and frequency.  Skin: Negative for pallor or rash   Neurological: Negative for weakness, light-headedness, numbness and headaches.  Hematological: Negative for adenopathy. Does not bruise/bleed easily.  Psychiatric/Behavioral: Negative for dysphoric mood. The patient is not nervous/anxious.  pos for stressors     Objective:   Physical Exam  Constitutional: He appears well-developed and well-nourished. No distress.  HENT:  Head: Normocephalic and atraumatic.  Mouth/Throat: Oropharynx is clear and moist.  Eyes: Conjunctivae and EOM are normal. Pupils are equal, round, and reactive to light. No scleral icterus.  Neck: Normal range of motion. Neck supple.  Cardiovascular: Normal rate, regular rhythm  and normal heart sounds.   Pulmonary/Chest: Effort normal and breath sounds normal. No respiratory distress. He has no wheezes. He has no rales.  Abdominal: Soft. Bowel sounds are normal. He exhibits no distension, no pulsatile liver, no abdominal bruit, no pulsatile midline mass and no mass. There is no hepatosplenomegaly. There is tenderness in the epigastric area.  There is no rebound, no guarding and no CVA tenderness.  Musculoskeletal: He exhibits no edema.  Lymphadenopathy:    He has no cervical adenopathy.  Neurological: He is alert.  Skin: Skin is warm and dry. No erythema. No pallor.  Psychiatric: He has a normal mood and affect.          Assessment & Plan:   Problem List Items Addressed This Visit    Gastritis - Primary    In a smoker with hx of gerd (not compliant with protonix) who smokes/has stress and has been taking aleve for HA Rev ED info Inc cr indicates dehydration- could cause ha, inst to inc water intake and stop soda Stop all nsaid  Take carafate  Take protonix bid Work on smoking cessation Update if worse or if any dark stool  F/u 4-6 wk for visit and lab

## 2015-06-12 ENCOUNTER — Ambulatory Visit: Payer: BLUE CROSS/BLUE SHIELD | Admitting: Family Medicine

## 2015-06-21 ENCOUNTER — Ambulatory Visit: Payer: BLUE CROSS/BLUE SHIELD | Admitting: Family Medicine

## 2015-07-05 ENCOUNTER — Ambulatory Visit: Payer: BLUE CROSS/BLUE SHIELD | Admitting: Family Medicine

## 2015-07-19 ENCOUNTER — Ambulatory Visit: Payer: BLUE CROSS/BLUE SHIELD | Admitting: Family Medicine

## 2015-07-20 ENCOUNTER — Other Ambulatory Visit: Payer: Self-pay | Admitting: Family Medicine

## 2015-07-22 ENCOUNTER — Ambulatory Visit (INDEPENDENT_AMBULATORY_CARE_PROVIDER_SITE_OTHER): Payer: BLUE CROSS/BLUE SHIELD | Admitting: Family Medicine

## 2015-07-22 ENCOUNTER — Encounter: Payer: Self-pay | Admitting: Family Medicine

## 2015-07-22 VITALS — BP 136/84 | HR 81 | Temp 97.6°F | Ht 69.0 in | Wt 185.5 lb

## 2015-07-22 DIAGNOSIS — IMO0001 Reserved for inherently not codable concepts without codable children: Secondary | ICD-10-CM

## 2015-07-22 DIAGNOSIS — F17200 Nicotine dependence, unspecified, uncomplicated: Secondary | ICD-10-CM | POA: Insufficient documentation

## 2015-07-22 DIAGNOSIS — Z720 Tobacco use: Secondary | ICD-10-CM | POA: Diagnosis not present

## 2015-07-22 DIAGNOSIS — Z23 Encounter for immunization: Secondary | ICD-10-CM

## 2015-07-22 DIAGNOSIS — K2970 Gastritis, unspecified, without bleeding: Secondary | ICD-10-CM | POA: Diagnosis not present

## 2015-07-22 DIAGNOSIS — R7989 Other specified abnormal findings of blood chemistry: Secondary | ICD-10-CM

## 2015-07-22 DIAGNOSIS — R748 Abnormal levels of other serum enzymes: Secondary | ICD-10-CM | POA: Diagnosis not present

## 2015-07-22 DIAGNOSIS — Z87891 Personal history of nicotine dependence: Secondary | ICD-10-CM | POA: Insufficient documentation

## 2015-07-22 MED ORDER — PANTOPRAZOLE SODIUM 40 MG PO TBEC: 40.0000 mg | DELAYED_RELEASE_TABLET | Freq: Two times a day (BID) | ORAL | Status: DC

## 2015-07-22 NOTE — Progress Notes (Signed)
Pre visit review using our clinic review tool, if applicable. No additional management support is needed unless otherwise documented below in the visit note. 

## 2015-07-22 NOTE — Patient Instructions (Addendum)
Keep a diet journal for a while - try to eliminate foods that make you gassy  You can use the levsin (hyoscamine) under the tongue for gas pain or cramping Continue protonix twice daily  Try to drink more water (selzer water with lemon or lime is ok)- unless carbonation makes you more gassy - then stop all carbonation  Think about quitting smoking- do it as soon as you are ready   Re check kidney labs today   Flu shot today

## 2015-07-22 NOTE — Progress Notes (Signed)
Subjective:    Patient ID: Donald Merritt, male    DOB: Aug 13, 1986, 29 y.o.   MRN: [id number]  HPI Here for f/u of gastritis   Still has gas build up on occasion  No burning in stomach No abdominal pain (that is much improved   Taking protinix twice daily  No longer taking carafate - has not needed it   Not using the levsin/ hyoscamine - ? Will help with gassiness   Has been some water  Some sodas   Chemistry      Component Value Date/Time   NA 137 05/14/2015 1304   K 5.0 05/14/2015 1304   CL 102 05/14/2015 1304   CO2 25 05/14/2015 1304   BUN 11 05/14/2015 1304   CREATININE 1.26* 05/14/2015 1304   CREATININE 1.01 02/23/2014 1429      Component Value Date/Time   CALCIUM 9.8 05/14/2015 1304   ALKPHOS 98 05/14/2015 1304   AST 28 05/14/2015 1304   ALT 36 05/14/2015 1304   BILITOT 1.2 05/14/2015 1304     is getting more fluid in general   Smoking status is still the same - 1/-1 pack per day  Wants to quit but not ready yet  Has not tried nicotine replacement - he gets cravings  Did well with e cig for a while for a step down- that went well for a while   Used to drink heavily in the distant past - now drinks only occasionally   Patient Active Problem List   Diagnosis Date Noted  . Smoking 07/22/2015  . Elevated serum creatinine 07/22/2015  . Gastritis 05/15/2015  . Hypertriglyceridemia 03/13/2014  . Screening for lipoid disorders 03/05/2014  . Gastroenteritis 02/23/2014  . Tick bite of back 01/16/2014  . Right low back pain 11/14/2013  . Right flank pain 11/14/2013  . Mid back pain on right side 11/14/2013  . Abdominal pain, unspecified site 01/31/2013  . GERD (gastroesophageal reflux disease) 09/04/2011  . History of kidney stones 03/12/2010  . MICROSCOPIC HEMATURIA 03/12/2010  . FLANK PAIN, RIGHT 03/12/2010  . [place], PERSISTENT 03/15/2008  . ALLERGIC RHINITIS 02/13/2008  . DEPRESSION 04/27/2007   Past Medical History  Diagnosis Date  .  Environmental allergies   . Panic attack   . Other and unspecified noninfectious gastroenteritis and colitis(558.9)   . Unspecified sleep apnea   . Other malaise and fatigue   . Tobacco use disorder   . Depression   . Renal stones   . GERD (gastroesophageal reflux disease)    Past Surgical History  Procedure Laterality Date  . Neg hx     Social History  Substance Use Topics  . Smoking status: Current Every Day Smoker -- 0.50 packs/day for 15 years    Types: Cigarettes  . Smokeless tobacco: Former Systems developer    Types: Snuff    Quit date: 10/20/2011  . Alcohol Use: 0.0 oz/week    0 Standard drinks or equivalent per week     Comment: occaisionally   Family History  Problem Relation Age of Onset  . Asthma Mother   . Hypertension Father   . Heart disease Paternal Grandmother   . Diabetes Paternal Grandmother   . Lactose intolerance Daughter    Allergies  Allergen Reactions  . Chantix [Varenicline]   . Betadine [Povidone Iodine] Rash   Current Outpatient Prescriptions on File Prior to Visit  Medication Sig Dispense Refill  . fexofenadine-pseudoephedrine (ALLEGRA-D 24) 180-240 MG per 24 hr tablet Take 1 tablet by  mouth daily.     . hyoscyamine (LEVSIN/SL) 0.125 MG SL tablet Place under the tongue every 6 (six) hours as needed. Place 1-2 tablets under the tongue every 6 hours prn for cramping     No current facility-administered medications on file prior to visit.    Review of Systems Review of Systems  Constitutional: Negative for fever, appetite change, fatigue and unexpected weight change.  Eyes: Negative for pain and visual disturbance.  Respiratory: Negative for cough and shortness of breath.   Cardiovascular: Negative for cp or palpitations    Gastrointestinal: Negative for nausea, diarrhea and constipation. pos for occ gas , neg for heartburn Genitourinary: Negative for urgency and frequency. neg for hematuria  Skin: Negative for pallor or rash   Neurological: Negative  for weakness, light-headedness, numbness and headaches.  Hematological: Negative for adenopathy. Does not bruise/bleed easily.  Psychiatric/Behavioral: Negative for dysphoric mood. The patient is not nervous/anxious.         Objective:   Physical Exam  Constitutional: He appears well-developed and well-nourished. No distress.  overwt and well appearing   HENT:  Head: Normocephalic and atraumatic.  Mouth/Throat: Oropharynx is clear and moist.  Eyes: Conjunctivae and EOM are normal. Pupils are equal, round, and reactive to light. No scleral icterus.  Neck: Normal range of motion. Neck supple.  Cardiovascular: Normal rate, regular rhythm and normal heart sounds.   Pulmonary/Chest: Effort normal and breath sounds normal. No respiratory distress. He has no wheezes. He has no rales.  Diffusely distant bs   Abdominal: Soft. Bowel sounds are normal. He exhibits no distension, no abdominal bruit and no mass. There is no hepatosplenomegaly. There is no tenderness. There is no rebound, no guarding and no CVA tenderness.  Lymphadenopathy:    He has no cervical adenopathy.  Neurological: He is alert.  Skin: Skin is warm and dry. No erythema. No pallor.  Psychiatric: He has a normal mood and affect.          Assessment & Plan:   Problem List Items Addressed This Visit      Digestive   Gastritis - Primary    Improved with bid protonix  Rev old EGD  Off carafate-doing ok  Rev diet  Still some gas issues-will keep a food journal and use hyoscamine for that as needed  Enc to quit smoking and quit soda         Other   Elevated serum creatinine    In the summer  Is drinking more fluids but not more water Disc imp of water  Lab today for bmet       Relevant Orders   Basic metabolic panel   Smoking    Disc in detail risks of smoking and possible outcomes including copd, vascular/ heart disease, cancer , respiratory and sinus infections  Pt voices understanding Not ready to quit  but wants to         Other Visit Diagnoses    Need for influenza vaccination        Relevant Orders    Flu Vaccine QUAD 36+ mos PF IM (Fluarix & Fluzone Quad PF) (Completed)

## 2015-07-22 NOTE — Assessment & Plan Note (Signed)
Improved with bid protonix  Rev old EGD  Off carafate-doing ok  Rev diet  Still some gas issues-will keep a food journal and use hyoscamine for that as needed  Enc to quit smoking and quit soda

## 2015-07-22 NOTE — Assessment & Plan Note (Signed)
Disc in detail risks of smoking and possible outcomes including copd, vascular/ heart disease, cancer , respiratory and sinus infections  Pt voices understanding Not ready to quit but wants to

## 2015-07-22 NOTE — Assessment & Plan Note (Signed)
In the summer  Is drinking more fluids but not more water Disc imp of water  Lab today for bmet

## 2015-07-23 LAB — BASIC METABOLIC PANEL
BUN: 15 mg/dL (ref 6–23)
CALCIUM: 9.9 mg/dL (ref 8.4–10.5)
CO2: 28 meq/L (ref 19–32)
CREATININE: 1.13 mg/dL (ref 0.40–1.50)
Chloride: 104 mEq/L (ref 96–112)
GFR: 80.69 mL/min (ref >60.00)
GLUCOSE: 62 mg/dL — AB (ref 70–99)
Potassium: 4.6 mEq/L (ref 3.5–5.1)
Sodium: 141 mEq/L (ref 135–145)

## 2015-07-23 NOTE — Addendum Note (Signed)
Addended by: Marchia Bond on: 07/23/2015 08:49 AM   Modules accepted: Orders

## 2015-07-24 ENCOUNTER — Encounter: Payer: Self-pay | Admitting: *Deleted

## 2015-11-27 ENCOUNTER — Emergency Department (HOSPITAL_COMMUNITY)
Admission: EM | Admit: 2015-11-27 | Discharge: 2015-11-27 | Disposition: A | Discharge status: Home / Self Care | Payer: BLUE CROSS/BLUE SHIELD | Attending: Emergency Medicine | Admitting: Emergency Medicine

## 2015-11-27 ENCOUNTER — Encounter (HOSPITAL_COMMUNITY): Payer: Self-pay | Admitting: Emergency Medicine

## 2015-11-27 ENCOUNTER — Emergency Department (HOSPITAL_COMMUNITY): Payer: BLUE CROSS/BLUE SHIELD

## 2015-11-27 ENCOUNTER — Telehealth: Payer: Self-pay | Admitting: Family Medicine

## 2015-11-27 DIAGNOSIS — Z8669 Personal history of other diseases of the nervous system and sense organs: Secondary | ICD-10-CM | POA: Insufficient documentation

## 2015-11-27 DIAGNOSIS — F17210 Nicotine dependence, cigarettes, uncomplicated: Secondary | ICD-10-CM | POA: Insufficient documentation

## 2015-11-27 DIAGNOSIS — K219 Gastro-esophageal reflux disease without esophagitis: Secondary | ICD-10-CM | POA: Diagnosis not present

## 2015-11-27 DIAGNOSIS — Z79899 Other long term (current) drug therapy: Secondary | ICD-10-CM | POA: Insufficient documentation

## 2015-11-27 DIAGNOSIS — Z87442 Personal history of urinary calculi: Secondary | ICD-10-CM | POA: Insufficient documentation

## 2015-11-27 DIAGNOSIS — Z8659 Personal history of other mental and behavioral disorders: Secondary | ICD-10-CM | POA: Insufficient documentation

## 2015-11-27 DIAGNOSIS — R1084 Generalized abdominal pain: Secondary | ICD-10-CM

## 2015-11-27 DIAGNOSIS — K5730 Diverticulosis of large intestine without perforation or abscess without bleeding: Secondary | ICD-10-CM

## 2015-11-27 DIAGNOSIS — R109 Unspecified abdominal pain: Secondary | ICD-10-CM | POA: Diagnosis present

## 2015-11-27 DIAGNOSIS — R1011 Right upper quadrant pain: Secondary | ICD-10-CM

## 2015-11-27 LAB — CBC WITH DIFFERENTIAL/PLATELET
BASOS PCT: 0 %
Basophils Absolute: 0 10*3/uL (ref 0.0–0.1)
EOS ABS: 0.6 10*3/uL (ref 0.0–0.7)
Eosinophils Relative: 6 %
HEMATOCRIT: 48.2 % (ref 39.0–52.0)
Hemoglobin: 16.7 g/dL (ref 13.0–17.0)
Lymphocytes Relative: 22 %
Lymphs Abs: 2.2 10*3/uL (ref 0.7–4.0)
MCH: 29.7 pg (ref 26.0–34.0)
MCHC: 34.6 g/dL (ref 30.0–36.0)
MCV: 85.8 fL (ref 78.0–100.0)
MONO ABS: 0.6 10*3/uL (ref 0.1–1.0)
MONOS PCT: 6 %
NEUTROS ABS: 6.6 10*3/uL (ref 1.7–7.7)
Neutrophils Relative %: 66 %
Platelets: 192 10*3/uL (ref 150–400)
RBC: 5.62 MIL/uL (ref 4.22–5.81)
RDW: 12.6 % (ref 11.5–15.5)
WBC: 10 10*3/uL (ref 4.0–10.5)

## 2015-11-27 LAB — COMPREHENSIVE METABOLIC PANEL
ALBUMIN: 4 g/dL (ref 3.5–5.0)
ALT: 28 U/L (ref 17–63)
ANION GAP: 11 (ref 5–15)
AST: 23 U/L (ref 15–41)
Alkaline Phosphatase: 90 U/L (ref 38–126)
BILIRUBIN TOTAL: 1.2 mg/dL (ref 0.3–1.2)
BUN: 14 mg/dL (ref 6–20)
CO2: 26 mmol/L (ref 22–32)
Calcium: 9.6 mg/dL (ref 8.9–10.3)
Chloride: 103 mmol/L (ref 101–111)
Creatinine, Ser: 0.95 mg/dL (ref 0.61–1.24)
GLUCOSE: 89 mg/dL (ref 65–99)
POTASSIUM: 4.6 mmol/L (ref 3.5–5.1)
Sodium: 140 mmol/L (ref 135–145)
TOTAL PROTEIN: 6.5 g/dL (ref 6.5–8.1)

## 2015-11-27 LAB — URINALYSIS, ROUTINE W REFLEX MICROSCOPIC
BILIRUBIN URINE: NEGATIVE
Glucose, UA: NEGATIVE mg/dL
Hgb urine dipstick: NEGATIVE
KETONES UR: NEGATIVE mg/dL
Leukocytes, UA: NEGATIVE
NITRITE: NEGATIVE
PH: 7.5 (ref 5.0–8.0)
Protein, ur: NEGATIVE mg/dL
Specific Gravity, Urine: 1.018 (ref 1.005–1.030)

## 2015-11-27 LAB — LIPASE, BLOOD: LIPASE: 23 U/L (ref 11–51)

## 2015-11-27 MED ORDER — SUCRALFATE 1 GM/10ML PO SUSP: 1.0000 g | Freq: Three times a day (TID) | ORAL | Status: DC

## 2015-11-27 MED ORDER — HYDROCODONE-ACETAMINOPHEN 5-325 MG PO TABS
1.0000 | ORAL_TABLET | Freq: Once | ORAL | Status: AC
  Administered 2015-11-27: 1 via ORAL
  Filled 2015-11-27: qty 1

## 2015-11-27 MED ORDER — IOHEXOL 300 MG/ML  SOLN
75.0000 mL | Freq: Once | INTRAMUSCULAR | Status: AC | PRN
  Administered 2015-11-27: 75 mL via INTRAVENOUS

## 2015-11-27 MED ORDER — ONDANSETRON HCL 4 MG/2ML IJ SOLN
4.0000 mg | Freq: Once | INTRAMUSCULAR | Status: AC
  Administered 2015-11-27: 4 mg via INTRAVENOUS
  Filled 2015-11-27: qty 2

## 2015-11-27 MED ORDER — GI COCKTAIL ~~LOC~~
30.0000 mL | Freq: Once | ORAL | Status: AC
  Administered 2015-11-27: 30 mL via ORAL
  Filled 2015-11-27: qty 30

## 2015-11-27 MED ORDER — SUCRALFATE 1 G PO TABS
1.0000 g | ORAL_TABLET | Freq: Once | ORAL | Status: AC
  Administered 2015-11-27: 1 g via ORAL
  Filled 2015-11-27: qty 1

## 2015-11-27 MED ORDER — HYDROCODONE-ACETAMINOPHEN 5-325 MG PO TABS: ORAL_TABLET | ORAL | Status: DC

## 2015-11-27 MED ORDER — HYOSCYAMINE SULFATE 0.125 MG SL SUBL: 0.1250 mg | SUBLINGUAL_TABLET | SUBLINGUAL | Status: DC | PRN

## 2015-11-27 MED ORDER — MORPHINE SULFATE (PF) 4 MG/ML IV SOLN
4.0000 mg | Freq: Once | INTRAVENOUS | Status: AC
  Administered 2015-11-27: 4 mg via INTRAVENOUS
  Filled 2015-11-27: qty 1

## 2015-11-27 MED ORDER — FAMOTIDINE IN NACL 20-0.9 MG/50ML-% IV SOLN
20.0000 mg | Freq: Once | INTRAVENOUS | Status: AC
  Administered 2015-11-27: 20 mg via INTRAVENOUS
  Filled 2015-11-27: qty 50

## 2015-11-27 NOTE — ED Notes (Signed)
Pt is in stable condition upon d/c and ambulates from ED. 

## 2015-11-27 NOTE — Telephone Encounter (Signed)
Please f/u with me If possible 30 minutes  Thanks

## 2015-11-27 NOTE — ED Provider Notes (Signed)
CSN: [id number]     Arrival date & time 11/27/15  [id number] History   First MD Initiated Contact with Patient 11/27/15 [phone]     Chief Complaint  Patient presents with  . Abdominal Pain     (Consider location/radiation/quality/duration/timing/severity/associated sxs/prior Treatment) HPI   Blood pressure 123/83, pulse 61, temperature 98.7 F (37.1 C), temperature source Oral, resp. rate 18, height 5\' 9"  (1.753 m), weight 86.183 kg, SpO2 98 %.  Donald Merritt is a 30 y.o. male past medical history significant for GERD, esophagitis, and gastric ulcers presents for evaluation of abdominal pain. Pt began with abdominal pain 5 days ago after eating at [employer]. He had one episode of vomiting at that time with non-bloody, non-bilious emesis. Since then, he states his symptoms improved, but then worsened 2 days ago. Pt states this pain is different from any pain he has had in the past. He describes diffuse tenderness, mostly periumbilical that worsens with food as well as sitting forward. He has been taking his protonix twice daily as prescribed, with no improvement in pain. Currently rated as 10/10 in severity, non-radiating in nature. There has been some runny stools/diarrhea, but the pt states this is not new for his GI history. Denies fevers, chills, cough, chest pain, or shortness of breath. No recent illness.  Past Medical History  Diagnosis Date  . Environmental allergies   . Panic attack   . Other and unspecified noninfectious gastroenteritis and colitis(558.9)   . Unspecified sleep apnea   . Other malaise and fatigue   . Tobacco use disorder   . Depression   . Renal stones   . GERD (gastroesophageal reflux disease)    Past Surgical History  Procedure Laterality Date  . Neg hx     Family History  Problem Relation Age of Onset  . Asthma Mother   . Hypertension Father   . Heart disease Paternal Grandmother   . Diabetes Paternal Grandmother   . Lactose intolerance Daughter     Social History  Substance Use Topics  . Smoking status: Current Every Day Smoker -- 0.50 packs/day for 15 years    Types: Cigarettes  . Smokeless tobacco: Former Systems developer    Types: Snuff    Quit date: 10/20/2011  . Alcohol Use: 0.0 oz/week    0 Standard drinks or equivalent per week     Comment: occaisionally    Review of Systems  10 systems reviewed and found to be negative, except as noted in the HPI.   Allergies  Chantix and Betadine  Home Medications   Prior to Admission medications   Medication Sig Start Date End Date Taking? Authorizing Provider  fexofenadine-pseudoephedrine (ALLEGRA-D 24) 180-240 MG per 24 hr tablet Take 1 tablet by mouth daily.    Yes Historical Provider, MD  pantoprazole (PROTONIX) 40 MG tablet Take 1 tablet (40 mg total) by mouth 2 (two) times daily. 07/22/15  Yes Abner Greenspan, MD  HYDROcodone-acetaminophen (NORCO/VICODIN) 5-325 MG tablet Take 1-2 tablets by mouth every 6 hours as needed for pain and/or cough. 11/27/15   Deontrey Massi, PA-C  hyoscyamine (LEVSIN/SL) 0.125 MG SL tablet Place 1 tablet (0.125 mg total) under the tongue every 4 (four) hours as needed. 11/27/15   Lalani Winkles, PA-C  sucralfate (CARAFATE) 1 GM/10ML suspension Take 10 mLs (1 g total) by mouth 4 (four) times daily -  with meals and at bedtime. 11/27/15   Malachai Schalk, PA-C   BP 117/82 mmHg  Pulse 65  Temp(Src) 98.7  F (37.1 C) (Oral)  Resp 18  Ht 5\' 9"  (1.753 m)  Wt 86.183 kg  BMI 28.05 kg/m2  SpO2 95% Physical Exam  Constitutional: He is oriented to person, place, and time. He appears well-developed and well-nourished. No distress.  HENT:  Head: Normocephalic and atraumatic.  Mouth/Throat: Oropharynx is clear and moist.  Eyes: Conjunctivae and EOM are normal. Pupils are equal, round, and reactive to light.  Neck: Normal range of motion.  Cardiovascular: Normal rate, regular rhythm and intact distal pulses.   Pulmonary/Chest: Effort normal and breath sounds  normal. No stridor.  Abdominal: Soft. There is tenderness.  Hyperactive bowel sounds.   Negative Murphy sign  Tender palpation in the right upper and right lower quadrant, Rovsing negative, Psoas positive, obturator are negative.  Musculoskeletal: Normal range of motion.  Neurological: He is alert and oriented to person, place, and time.  Skin: He is not diaphoretic.  Psychiatric: He has a normal mood and affect.  Nursing note and vitals reviewed.   ED Course  Procedures (including critical care time) Labs Review Labs Reviewed  CBC WITH DIFFERENTIAL/PLATELET  URINALYSIS, ROUTINE W REFLEX MICROSCOPIC (NOT AT [facility])  COMPREHENSIVE METABOLIC PANEL  LIPASE, BLOOD    Imaging Review Ct Abdomen Pelvis W Contrast  11/27/2015  CLINICAL DATA:  30 year old with periumbilical abdominal pain, nausea and vomiting for 5 days. History of esophagitis. EXAM: CT ABDOMEN AND PELVIS WITH CONTRAST TECHNIQUE: Multidetector CT imaging of the abdomen and pelvis was performed using the standard protocol following bolus administration of intravenous contrast. CONTRAST:  31mL OMNIPAQUE IOHEXOL 300 MG/ML  SOLN COMPARISON:  Abdominal pelvic CT 12/25/2011 FINDINGS: Lower chest: Minimal dependent atelectasis at both lung bases. There is no significant pleural or pericardial effusion. Hepatobiliary: Contrast bolus is less optimal than on the prior study. Focal fat adjacent to the falciform ligament is grossly stable. No new or enlarging hepatic lesions are identified. No evidence of gallstones, gallbladder wall thickening or biliary dilatation. Pancreas: Unremarkable. No pancreatic ductal dilatation or surrounding inflammatory changes. Spleen: Normal in size without focal abnormality. Adrenals/Urinary Tract: Both adrenal glands appear normal. The kidneys appear normal without evidence of urinary tract calculus, suspicious lesion or hydronephrosis. No bladder abnormalities are seen. Stomach/Bowel: No evidence of bowel wall  thickening, distention or surrounding inflammatory change. There are mild sigmoid colon diverticular changes. The appendix appears normal. Vascular/Lymphatic: There are no enlarged abdominal or pelvic lymph nodes. No significant vascular findings are present. Reproductive: The prostate gland is stable in size with central dystrophic calcifications. The seminal vesicles appear normal. Other: No evidence of abdominal wall mass or hernia. Musculoskeletal: No acute or significant osseous findings. Stable L3 limbus vertebra. IMPRESSION: 1. No acute findings or explanation for the patient's symptoms. The appendix appears normal. 2. Mild sigmoid diverticulosis. Electronically Signed   By: Richardean Sale M.D.   On: 11/27/2015 10:37   US Abdomen Limited Ruq  11/27/2015  CLINICAL DATA:  Right upper quadrant pain. EXAM: US ABDOMEN LIMITED - RIGHT UPPER QUADRANT COMPARISON:  CT of the abdomen and pelvis 11/27/2015 FINDINGS: Gallbladder: No gallstones or wall thickening visualized. Gallbladder wall measures 2.4 mm. No sonographic Murphy sign noted by sonographer. Common bile duct: Diameter: 3.3 mm Liver: No focal lesion identified. Within normal limits in parenchymal echogenicity. Main portal vein has normal directional flow. IMPRESSION: Normal sonographic appearance of the right upper quadrant. Electronically Signed   By: Fidela Salisbury M.D.   On: 11/27/2015 12:35   I have personally reviewed and evaluated these images and  lab results as part of my medical decision-making.   EKG Interpretation None      MDM   Final diagnoses:  Diverticulosis of large intestine without hemorrhage  Generalized abdominal pain  RUQ abdominal pain    Filed Vitals:   11/27/15 1029 11/27/15 1100 11/27/15 1130 11/27/15 1200  BP: 123/83 130/84 118/86 117/82  Pulse: 61 69 70 65  Temp:      TempSrc:      Resp: 18     Height:      Weight:      SpO2: 98% 97% 97% 95%    Medications  morphine 4 MG/ML injection 4 mg (4 mg  Intravenous Given 11/27/15 0854)  ondansetron (ZOFRAN) injection 4 mg (4 mg Intravenous Given 11/27/15 0854)  iohexol (OMNIPAQUE) 300 MG/ML solution 75 mL (75 mLs Intravenous Contrast Given 11/27/15 1015)  famotidine (PEPCID) IVPB 20 mg premix (0 mg Intravenous Stopped 11/27/15 1141)  sucralfate (CARAFATE) tablet 1 g (1 g Oral Given 11/27/15 1107)  gi cocktail (Maalox,Lidocaine,Donnatal) (30 mLs Oral Given 11/27/15 1108)  HYDROcodone-acetaminophen (NORCO/VICODIN) 5-325 MG per tablet 1 tablet (1 tablet Oral Given 11/27/15 1107)    Donald Merritt is 30 y.o. male presenting with severe abdominal pain worsening over the course of 5 days not associated with fever, or diarrhea. Patient had episode of emesis at the onset of the pain several days ago but this has resolved. Abdominal exam with tenderness worse on the right than the left, states this is different than his typical reflux discomfort. Considering his positive psoas sign, will CT.  Blood work and urinalysis reassuring with no abnormalities.  11 AM, patient seen and evaluated the bedside, he states his pain is 10 out of 10, states the morphine improved it to 8 out of 10 and it recurred. Repeat abdominal exam remains nonsurgical, patient again reinforces that he's been compliant with his proton X 40 mg twice a day. We'll try GI cocktail,  Carafate, Vicodin, and right upper quadrant ultrasound given the exacerbation of his symptoms with fatty foods.   Ultrasound negative.  Patient will be given referral to [facility] gastroenterology. Patient has no chest pain or shortness of breath it may be tenderness source of his discomfort, I do think it's likely related to his reflux and esophagitis.  Evaluation does not show pathology that would require ongoing emergent intervention or inpatient treatment. Pt is hemodynamically stable and mentating appropriately. Discussed findings and plan with patient/guardian, who agrees with care plan. All questions answered. Return  precautions discussed and outpatient follow up given.   New Prescriptions   HYDROCODONE-ACETAMINOPHEN (NORCO/VICODIN) 5-325 MG TABLET    Take 1-2 tablets by mouth every 6 hours as needed for pain and/or cough.   HYOSCYAMINE (LEVSIN/SL) 0.125 MG SL TABLET    Place 1 tablet (0.125 mg total) under the tongue every 4 (four) hours as needed.   SUCRALFATE (CARAFATE) 1 GM/10ML SUSPENSION    Take 10 mLs (1 g total) by mouth 4 (four) times daily -  with meals and at bedtime.        Monico Blitz, PA-C 11/27/15 Luis Llorens Torres, MD 11/28/15 [phone]

## 2015-11-27 NOTE — Telephone Encounter (Signed)
See Prev note:  Called pt back and he said that when he was d/c from the hospital they advise him he needs to call and set up a endoscopy to eval sxs, pt wanted to know if he needs to see you 1st before he schedules appt., also pt said his grandfather was diagnoses with IBS and he is having some of the same sxs and wanted to see how he would be diagnoses with IBS too. Pt said he is having pain around his belly button that is radiating through his side to his back, he also has occasionally threw up due nausea/ abd pain, and he has a lot of loose stools  Pt okay with call back in the morning with Dr. Marliss Coots recommendations

## 2015-11-27 NOTE — Telephone Encounter (Signed)
Pt called, he was recently discharged from hospital and wants to ask couple of questions.  Thank you  cb number is [phone]

## 2015-11-27 NOTE — Discharge Instructions (Signed)
Take vicodin for breakthrough pain, do not drink alcohol, drive, care for children or do other critical tasks while taking vicodin.  Please follow with your primary care doctor in the next 2 days for a check-up. They must obtain records for further management.   Do not hesitate to return to the Emergency Department for any new, worsening or concerning symptoms.     Abdominal Pain, Adult Many things can cause abdominal pain. Usually, abdominal pain is not caused by a disease and will improve without treatment. It can often be observed and treated at home. Your health care provider will do a physical exam and possibly order blood tests and X-rays to help determine the seriousness of your pain. However, in many cases, more time must pass before a clear cause of the pain can be found. Before that point, your health care provider may not know if you need more testing or further treatment. HOME CARE INSTRUCTIONS Monitor your abdominal pain for any changes. The following actions may help to alleviate any discomfort you are experiencing:  Only take over-the-counter or prescription medicines as directed by your health care provider.  Do not take laxatives unless directed to do so by your health care provider.  Try a clear liquid diet (broth, tea, or water) as directed by your health care provider. Slowly move to a bland diet as tolerated. SEEK MEDICAL CARE IF:  You have unexplained abdominal pain.  You have abdominal pain associated with nausea or diarrhea.  You have pain when you urinate or have a bowel movement.  You experience abdominal pain that wakes you in the night.  You have abdominal pain that is worsened or improved by eating food.  You have abdominal pain that is worsened with eating fatty foods.  You have a fever. SEEK IMMEDIATE MEDICAL CARE IF:  Your pain does not go away within 2 hours.  You keep throwing up (vomiting).  Your pain is felt only in portions of the abdomen,  such as the right side or the left lower portion of the abdomen.  You pass bloody or black tarry stools. MAKE SURE YOU:  Understand these instructions.  Will watch your condition.  Will get help right away if you are not doing well or get worse.   This information is not intended to replace advice given to you by your health care provider. Make sure you discuss any questions you have with your health care provider.   Document Released: 07/15/2005 Document Revised: 06/26/2015 Document Reviewed: 06/14/2013 Elsevier Interactive Patient Education [employer].

## 2015-11-27 NOTE — ED Notes (Signed)
Pt here for eval of abdominal pain starting Friday. Pt has hx of gastric ulcers. Pt denies n/v/d. Pt also reports low back pain.

## 2015-11-28 NOTE — Telephone Encounter (Signed)
Left voicemail letting pt know he needs to schedule a 30 min appt with Dr. Glori Bickers

## 2015-11-29 ENCOUNTER — Encounter: Payer: Self-pay | Admitting: Family Medicine

## 2015-11-29 ENCOUNTER — Ambulatory Visit (INDEPENDENT_AMBULATORY_CARE_PROVIDER_SITE_OTHER): Payer: BLUE CROSS/BLUE SHIELD | Admitting: Family Medicine

## 2015-11-29 VITALS — BP 124/82 | HR 82 | Temp 98.1°F | Wt 179.0 lb

## 2015-11-29 DIAGNOSIS — R1010 Upper abdominal pain, unspecified: Secondary | ICD-10-CM | POA: Diagnosis not present

## 2015-11-29 DIAGNOSIS — R109 Unspecified abdominal pain: Secondary | ICD-10-CM | POA: Insufficient documentation

## 2015-11-29 NOTE — Patient Instructions (Signed)
Stop at check out for referral to GI  Eat a bland diet Continue carafate and protonix  If pain suddenly worsens- let us know or go to ER

## 2015-11-29 NOTE — Progress Notes (Signed)
Pre visit review using our clinic review tool, if applicable. No additional management support is needed unless otherwise documented below in the visit note. 

## 2015-11-29 NOTE — Progress Notes (Signed)
Subjective:    Patient ID: Donald Merritt, male    DOB: Oct 24, 1985, 30 y.o.   MRN: [id number]  HPI  Here for abd pain  Was seen in ED 2/8 with severe abd pain   Began 4 d after eating at [employer] - vomited once and then developed pain  (ate steak and cheese)  Takes protonix bid  abd pain got worse and worse - by Wed he could not stand   Had R abd tenderness on exam Ct Abdomen Pelvis W Contrast  11/27/2015  CLINICAL DATA:  30 year old with periumbilical abdominal pain, nausea and vomiting for 5 days. History of esophagitis. EXAM: CT ABDOMEN AND PELVIS WITH CONTRAST TECHNIQUE: Multidetector CT imaging of the abdomen and pelvis was performed using the standard protocol following bolus administration of intravenous contrast. CONTRAST:  69mL OMNIPAQUE IOHEXOL 300 MG/ML  SOLN COMPARISON:  Abdominal pelvic CT 12/25/2011 FINDINGS: Lower chest: Minimal dependent atelectasis at both lung bases. There is no significant pleural or pericardial effusion. Hepatobiliary: Contrast bolus is less optimal than on the prior study. Focal fat adjacent to the falciform ligament is grossly stable. No new or enlarging hepatic lesions are identified. No evidence of gallstones, gallbladder wall thickening or biliary dilatation. Pancreas: Unremarkable. No pancreatic ductal dilatation or surrounding inflammatory changes. Spleen: Normal in size without focal abnormality. Adrenals/Urinary Tract: Both adrenal glands appear normal. The kidneys appear normal without evidence of urinary tract calculus, suspicious lesion or hydronephrosis. No bladder abnormalities are seen. Stomach/Bowel: No evidence of bowel wall thickening, distention or surrounding inflammatory change. There are mild sigmoid colon diverticular changes. The appendix appears normal. Vascular/Lymphatic: There are no enlarged abdominal or pelvic lymph nodes. No significant vascular findings are present. Reproductive: The prostate gland is stable in size with central  dystrophic calcifications. The seminal vesicles appear normal. Other: No evidence of abdominal wall mass or hernia. Musculoskeletal: No acute or significant osseous findings. Stable L3 limbus vertebra. IMPRESSION: 1. No acute findings or explanation for the patient's symptoms. The appendix appears normal. 2. Mild sigmoid diverticulosis. Electronically Signed   By: Richardean Sale M.D.   On: 11/27/2015 10:37   US Abdomen Limited Ruq  11/27/2015  CLINICAL DATA:  Right upper quadrant pain. EXAM: US ABDOMEN LIMITED - RIGHT UPPER QUADRANT COMPARISON:  CT of the abdomen and pelvis 11/27/2015 FINDINGS: Gallbladder: No gallstones or wall thickening visualized. Gallbladder wall measures 2.4 mm. No sonographic Murphy sign noted by sonographer. Common bile duct: Diameter: 3.3 mm Liver: No focal lesion identified. Within normal limits in parenchymal echogenicity. Main portal vein has normal directional flow. IMPRESSION: Normal sonographic appearance of the right upper quadrant. Electronically Signed   By: Fidela Salisbury M.D.   On: 11/27/2015 12:35     Results for orders placed or performed during the hospital encounter of 11/27/15  CBC with Differential  Result Value Ref Range   WBC 10.0 4.0 - 10.5 K/uL   RBC 5.62 4.22 - 5.81 MIL/uL   Hemoglobin 16.7 13.0 - 17.0 g/dL   HCT 48.2 39.0 - 52.0 %   MCV 85.8 78.0 - 100.0 fL   MCH 29.7 26.0 - 34.0 pg   MCHC 34.6 30.0 - 36.0 g/dL   RDW 12.6 11.5 - 15.5 %   Platelets 192 150 - 400 K/uL   Neutrophils Relative % 66 %   Neutro Abs 6.6 1.7 - 7.7 K/uL   Lymphocytes Relative 22 %   Lymphs Abs 2.2 0.7 - 4.0 K/uL   Monocytes Relative 6 %  Monocytes Absolute 0.6 0.1 - 1.0 K/uL   Eosinophils Relative 6 %   Eosinophils Absolute 0.6 0.0 - 0.7 K/uL   Basophils Relative 0 %   Basophils Absolute 0.0 0.0 - 0.1 K/uL  Urinalysis, Routine w reflex microscopic (not at [facility])  Result Value Ref Range   Color, Urine YELLOW YELLOW   APPearance CLEAR CLEAR   Specific Gravity,  Urine 1.018 1.005 - 1.030   pH 7.5 5.0 - 8.0   Glucose, UA NEGATIVE NEGATIVE mg/dL   Hgb urine dipstick NEGATIVE NEGATIVE   Bilirubin Urine NEGATIVE NEGATIVE   Ketones, ur NEGATIVE NEGATIVE mg/dL   Protein, ur NEGATIVE NEGATIVE mg/dL   Nitrite NEGATIVE NEGATIVE   Leukocytes, UA NEGATIVE NEGATIVE  Comprehensive metabolic panel  Result Value Ref Range   Sodium 140 135 - 145 mmol/L   Potassium 4.6 3.5 - 5.1 mmol/L   Chloride 103 101 - 111 mmol/L   CO2 26 22 - 32 mmol/L   Glucose, Bld 89 65 - 99 mg/dL   BUN 14 6 - 20 mg/dL   Creatinine, Ser 0.95 0.61 - 1.24 mg/dL   Calcium 9.6 8.9 - 10.3 mg/dL   Total Protein 6.5 6.5 - 8.1 g/dL   Albumin 4.0 3.5 - 5.0 g/dL   AST 23 15 - 41 U/L   ALT 28 17 - 63 U/L   Alkaline Phosphatase 90 38 - 126 U/L   Total Bilirubin 1.2 0.3 - 1.2 mg/dL   GFR calc non Af Amer >60 >60 mL/min   GFR calc Af Amer >60 >60 mL/min   Anion gap 11 5 - 15  Lipase, blood  Result Value Ref Range   Lipase 23 11 - 51 U/L   only thing that eased pain was morphine     Now most of the pain is around his umbilicus Getting some pain in his back - burning sensation  Had heartburn - it was bad - that is gone now   Thinks pain is 7/10 on the pain scale   Pain is worse to lie on left side  Eating bland - and no difference whether he eats or not   Skin feels like it is burning  No rash   Still a smoker -plans to work on that (with his wife)   Hx of ? PUD  Last EGD 2014   Taking carafate qid- helps very little   Patient Active Problem List   Diagnosis Date Noted  . Smoking 07/22/2015  . Elevated serum creatinine 07/22/2015  . Gastritis 05/15/2015  . Hypertriglyceridemia 03/13/2014  . Screening for lipoid disorders 03/05/2014  . Gastroenteritis 02/23/2014  . Tick bite of back 01/16/2014  . Right low back pain 11/14/2013  . Right flank pain 11/14/2013  . Mid back pain on right side 11/14/2013  . Abdominal pain, unspecified site 01/31/2013  . GERD  (gastroesophageal reflux disease) 09/04/2011  . History of kidney stones 03/12/2010  . MICROSCOPIC HEMATURIA 03/12/2010  . FLANK PAIN, RIGHT 03/12/2010  . [place], PERSISTENT 03/15/2008  . ALLERGIC RHINITIS 02/13/2008  . DEPRESSION 04/27/2007   Past Medical History  Diagnosis Date  . Environmental allergies   . Panic attack   . Other and unspecified noninfectious gastroenteritis and colitis(558.9)   . Unspecified sleep apnea   . Other malaise and fatigue   . Tobacco use disorder   . Depression   . Renal stones   . GERD (gastroesophageal reflux disease)    Past Surgical History  Procedure Laterality Date  .  Neg hx     Social History  Substance Use Topics  . Smoking status: Current Every Day Smoker -- 0.50 packs/day for 15 years    Types: Cigarettes  . Smokeless tobacco: Former Systems developer    Types: Snuff    Quit date: 10/20/2011  . Alcohol Use: 0.0 oz/week    0 Standard drinks or equivalent per week     Comment: occasionally   Family History  Problem Relation Age of Onset  . Asthma Mother   . Hypertension Father   . Heart disease Paternal Grandmother   . Diabetes Paternal Grandmother   . Lactose intolerance Daughter    Allergies  Allergen Reactions  . Chantix [Varenicline]   . Betadine [Povidone Iodine] Rash   Current Outpatient Prescriptions on File Prior to Visit  Medication Sig Dispense Refill  . fexofenadine-pseudoephedrine (ALLEGRA-D 24) 180-240 MG per 24 hr tablet Take 1 tablet by mouth daily.     Marland Kitchen HYDROcodone-acetaminophen (NORCO/VICODIN) 5-325 MG tablet Take 1-2 tablets by mouth every 6 hours as needed for pain and/or cough. 7 tablet 0  . hyoscyamine (LEVSIN/SL) 0.125 MG SL tablet Place 1 tablet (0.125 mg total) under the tongue every 4 (four) hours as needed. 30 tablet 0  . pantoprazole (PROTONIX) 40 MG tablet Take 1 tablet (40 mg total) by mouth 2 (two) times daily. 180 tablet 3  . sucralfate (CARAFATE) 1 GM/10ML suspension Take 10 mLs (1 g total) by mouth  4 (four) times daily -  with meals and at bedtime. 420 mL 0   No current facility-administered medications on file prior to visit.      Review of Systems Review of Systems  Constitutional: Negative for fever, appetite change, fatigue and unexpected weight change.  Eyes: Negative for pain and visual disturbance.  Respiratory: Negative for cough and shortness of breath.   Cardiovascular: Negative for cp or palpitations    Gastrointestinal: Negative for  diarrhea and constipation. pos for upper abdominal pain , pos for heartburn that is now resolved, neg for dark stool or blood in stool Genitourinary: Negative for urgency and frequency.  Skin: Negative for pallor or rash   Neurological: Negative for weakness, light-headedness, numbness and headaches.  Hematological: Negative for adenopathy. Does not bruise/bleed easily.  Psychiatric/Behavioral: Negative for dysphoric mood. The patient is not nervous/anxious.         Objective:   Physical Exam  Constitutional: He appears well-developed and well-nourished. No distress.  HENT:  Head: Normocephalic and atraumatic.  Eyes: Conjunctivae and EOM are normal. Pupils are equal, round, and reactive to light.  Neck: Normal range of motion. Neck supple.  Cardiovascular: Normal rate and regular rhythm.   Pulmonary/Chest: Breath sounds normal. No respiratory distress. He has no wheezes. He has no rales.  Abdominal: Soft. Bowel sounds are normal. He exhibits no distension, no pulsatile liver, no abdominal bruit, no pulsatile midline mass and no mass. There is no hepatosplenomegaly. There is tenderness in the right upper quadrant and epigastric area. There is no rebound, no guarding and no CVA tenderness.  Musculoskeletal: He exhibits no edema.  Lymphadenopathy:    He has no cervical adenopathy.  Neurological: He is alert. He has normal reflexes.  Skin: Skin is warm and dry. No rash noted. No erythema. No pallor.  Psychiatric: He has a normal mood and  affect.          Assessment & Plan:   Problem List Items Addressed This Visit      Other   Abdominal pain -  Primary    Epigastric and RUQ Rev CT and abd [place] and labs from ED  Improved but not resolved Will continue carafate and protonix and bland diet  Ref to GI for further eval - likely EGD Will update in the meantime if symptoms worsen       Relevant Orders   Ambulatory referral to Gastroenterology

## 2015-12-01 NOTE — Assessment & Plan Note (Signed)
Epigastric and RUQ Rev CT and abd [place] and labs from ED  Improved but not resolved Will continue carafate and protonix and bland diet  Ref to GI for further eval - likely EGD Will update in the meantime if symptoms worsen

## 2015-12-09 ENCOUNTER — Encounter: Payer: Self-pay | Admitting: Gastroenterology

## 2015-12-09 ENCOUNTER — Other Ambulatory Visit (INDEPENDENT_AMBULATORY_CARE_PROVIDER_SITE_OTHER): Payer: BLUE CROSS/BLUE SHIELD

## 2015-12-09 ENCOUNTER — Ambulatory Visit (INDEPENDENT_AMBULATORY_CARE_PROVIDER_SITE_OTHER): Payer: BLUE CROSS/BLUE SHIELD | Admitting: Gastroenterology

## 2015-12-09 VITALS — BP 120/70 | HR 68 | Ht 69.0 in | Wt 175.2 lb

## 2015-12-09 DIAGNOSIS — R1084 Generalized abdominal pain: Secondary | ICD-10-CM | POA: Diagnosis not present

## 2015-12-09 DIAGNOSIS — K219 Gastro-esophageal reflux disease without esophagitis: Secondary | ICD-10-CM

## 2015-12-09 DIAGNOSIS — K589 Irritable bowel syndrome without diarrhea: Secondary | ICD-10-CM | POA: Diagnosis not present

## 2015-12-09 DIAGNOSIS — R197 Diarrhea, unspecified: Secondary | ICD-10-CM

## 2015-12-09 LAB — IGA: IgA: 337 mg/dL (ref 68–378)

## 2015-12-09 MED ORDER — GLYCOPYRROLATE 2 MG PO TABS: 2.0000 mg | ORAL_TABLET | Freq: Two times a day (BID) | ORAL | Status: DC

## 2015-12-09 MED ORDER — HYOSCYAMINE SULFATE 0.125 MG SL SUBL: SUBLINGUAL_TABLET | SUBLINGUAL | Status: DC

## 2015-12-09 NOTE — Patient Instructions (Addendum)
You have been given a separate informational sheet regarding your tobacco use, the importance of quitting and local resources to help you quit.  Your physician has requested that you go to the basement for the following lab work before leaving today:TTG, IGA.  Stay on Lactose free diet x 10 days.   We have sent the following medications to your pharmacy for you to pick up at your convenience: Robinul..  Increase your Levsin to 1-2 tablets by mouth before meals.   Patient advised to avoid spicy, acidic, citrus, chocolate, mints, fruit and fruit juices.  Limit the intake of caffeine, alcohol and Soda.  Don't exercise too soon after eating.  Don't lie down within 3-4 hours of eating.  Elevate the head of your bed.  Your follow up appointment with Dr. Fuller Plan is on 01/20/16 at 2:45pm.  Thank you for choosing me and [facility].  Pricilla Riffle. Dagoberto Ligas., MD., Marval Regal

## 2015-12-09 NOTE — Progress Notes (Signed)
    History of Present Illness: This is a 30 year old male with abdominal pain. He has history of GERD, nonerosive gastritis and IBS. EGD in May 2014 showed mild chronic non-erosive gastritis. He was evaluated in the [facility] on 11/27/15 for abdominal pain. CBC, CMP and lipase were normal. Imaging findings below. He relates several weeks of her emboli: Generalized abdominal pain associated with bloating and diarrhea. His symptoms are generally triggered by meals. He can tolerate small meals but not a larger meal. His bowel habits range from 1 formed bowel movement per day to several loose stools per day. He denies constipation.  Abd/pelvic CT from 11/27/15 IMPRESSION: 1. No acute findings or explanation for the patient's symptoms. The appendix appears normal. 2. Mild sigmoid diverticulosis.  Abd [place] 11/27/15 IMPRESSION: Normal sonographic appearance of the right upper quadrant.   Current Medications, Allergies, Past Medical History, Past Surgical History, Family History and Social History were reviewed in [employer] record.  Physical Exam: General: Well developed, well nourished, no acute distress Head: Normocephalic and atraumatic Eyes:  sclerae anicteric, EOMI Ears: Normal auditory acuity Mouth: No deformity or lesions Lungs: Clear throughout to auscultation Heart: Regular rate and rhythm; no murmurs, rubs or bruits Abdomen: Soft, mild generalized tenderness without rebound or guarding and non distended. No masses, hepatosplenomegaly or hernias noted. Normal Bowel sounds Musculoskeletal: Symmetrical with no gross deformities  Pulses:  Normal pulses noted Extremities: No clubbing, cyanosis, edema or deformities noted Neurological: Alert oriented x 4, grossly nonfocal Psychological:  Alert and cooperative. Normal mood and affect  Assessment and Recommendations:  1. Presumed IBS. R/O celiac disease. R/O lactose intolerance. Begin glycopyrrolate 2 mg twice daily. Change  hyoscyamine to 1-2 before meals. Begin a 10 day strict lactose free diet. If symptoms improved remain on a lactose free diet. Check tTG and IgA. Low FODMAP diet and return office visit in 6 weeks.  2. GERD. Strictly follow all antireflux measures. Continue pantoprazole 40 mg twice daily. Discontinue Carafate.

## 2015-12-10 LAB — TISSUE TRANSGLUTAMINASE, IGA: Tissue Transglutaminase Ab, IgA: 1 U/mL (ref <4)

## 2016-01-20 ENCOUNTER — Encounter: Payer: Self-pay | Admitting: Gastroenterology

## 2016-01-20 ENCOUNTER — Ambulatory Visit (INDEPENDENT_AMBULATORY_CARE_PROVIDER_SITE_OTHER): Payer: BLUE CROSS/BLUE SHIELD | Admitting: Gastroenterology

## 2016-01-20 VITALS — BP 124/68 | HR 72 | Ht 69.0 in | Wt 181.4 lb

## 2016-01-20 DIAGNOSIS — K589 Irritable bowel syndrome without diarrhea: Secondary | ICD-10-CM | POA: Diagnosis not present

## 2016-01-20 DIAGNOSIS — E739 Lactose intolerance, unspecified: Secondary | ICD-10-CM

## 2016-01-20 DIAGNOSIS — R197 Diarrhea, unspecified: Secondary | ICD-10-CM | POA: Diagnosis not present

## 2016-01-20 NOTE — Patient Instructions (Signed)
Thank you for choosing me and [facility].  Malcolm T. Stark, Jr., MD., FACG  

## 2016-01-20 NOTE — Progress Notes (Signed)
    History of Present Illness: This is a 30 year old male returning for follow-up of abdominal pain, abdominal bloating, diarrhea and GERD. Symptoms have almost completely resolved on a lactose-free diet glycopyrrolate and pantoprazole. He currently has no GI complaints.  Current Medications, Allergies, Past Medical History, Past Surgical History, Family History and Social History were reviewed in [employer] record.  Physical Exam: General: Well developed, well nourished, no acute distress Head: Normocephalic and atraumatic Eyes:  sclerae anicteric, EOMI Ears: Normal auditory acuity Mouth: No deformity or lesions Lungs: Clear throughout to auscultation Heart: Regular rate and rhythm; no murmurs, rubs or bruits Abdomen: Soft, non tender and non distended. No masses, hepatosplenomegaly or hernias noted. Normal Bowel sounds  Musculoskeletal: Symmetrical with no gross deformities  Pulses:  Normal pulses noted Extremities: No clubbing, cyanosis, edema or deformities noted Neurological: Alert oriented x 4, grossly nonfocal Psychological:  Alert and cooperative. Normal mood and affect  Assessment and Recommendations:  1. IBS and lactose intolerance. Continue lactose-free diet or very low lactose diet. Continue glycopyrrolate twice daily as needed. REV in 1 year.  2. GERD. Continue pantoprazole 40 mg daily and antireflux measures. REV in 1 year.   I spent 15 minutes of face-to-face time with the patient. Greater than 50% of the time was spent counseling and coordinating care.

## 2016-09-14 ENCOUNTER — Other Ambulatory Visit: Payer: Self-pay

## 2016-09-14 MED ORDER — GLYCOPYRROLATE 2 MG PO TABS: 2.0000 mg | ORAL_TABLET | Freq: Two times a day (BID) | ORAL | 0 refills | Status: DC

## 2016-10-05 ENCOUNTER — Other Ambulatory Visit: Payer: Self-pay | Admitting: Family Medicine

## 2016-10-05 NOTE — Telephone Encounter (Signed)
Please schedule f/u March and refill until then

## 2016-10-05 NOTE — Telephone Encounter (Signed)
No recent/future appts., please advise  

## 2016-10-06 NOTE — Telephone Encounter (Signed)
Left voicemail requesting pt to call the office back 

## 2016-10-23 ENCOUNTER — Other Ambulatory Visit: Payer: Self-pay | Admitting: *Deleted

## 2016-10-23 MED ORDER — PANTOPRAZOLE SODIUM 40 MG PO TBEC: 40.0000 mg | DELAYED_RELEASE_TABLET | Freq: Two times a day (BID) | ORAL | 0 refills | Status: DC

## 2016-10-23 NOTE — Telephone Encounter (Signed)
No recent/ future appt., please advise

## 2016-10-23 NOTE — Telephone Encounter (Signed)
Please schedule spring f/u and refill until then

## 2016-10-23 NOTE — Telephone Encounter (Signed)
appt scheduled and med refilled 

## 2017-01-01 ENCOUNTER — Ambulatory Visit: Payer: BLUE CROSS/BLUE SHIELD | Admitting: Family Medicine

## 2017-01-04 ENCOUNTER — Ambulatory Visit (INDEPENDENT_AMBULATORY_CARE_PROVIDER_SITE_OTHER): Payer: BLUE CROSS/BLUE SHIELD | Admitting: Family Medicine

## 2017-01-04 ENCOUNTER — Encounter (INDEPENDENT_AMBULATORY_CARE_PROVIDER_SITE_OTHER): Payer: Self-pay

## 2017-01-04 ENCOUNTER — Encounter: Payer: Self-pay | Admitting: Family Medicine

## 2017-01-04 VITALS — BP 130/78 | HR 80 | Temp 98.3°F | Ht 69.0 in | Wt 187.5 lb

## 2017-01-04 DIAGNOSIS — K582 Mixed irritable bowel syndrome: Secondary | ICD-10-CM

## 2017-01-04 DIAGNOSIS — F17200 Nicotine dependence, unspecified, uncomplicated: Secondary | ICD-10-CM

## 2017-01-04 DIAGNOSIS — K219 Gastro-esophageal reflux disease without esophagitis: Secondary | ICD-10-CM | POA: Diagnosis not present

## 2017-01-04 DIAGNOSIS — J309 Allergic rhinitis, unspecified: Secondary | ICD-10-CM | POA: Diagnosis not present

## 2017-01-04 DIAGNOSIS — K589 Irritable bowel syndrome without diarrhea: Secondary | ICD-10-CM | POA: Insufficient documentation

## 2017-01-04 MED ORDER — HYOSCYAMINE SULFATE 0.125 MG SL SUBL: SUBLINGUAL_TABLET | SUBLINGUAL | 2 refills | Status: DC

## 2017-01-04 MED ORDER — GLYCOPYRROLATE 2 MG PO TABS: 2.0000 mg | ORAL_TABLET | Freq: Two times a day (BID) | ORAL | 3 refills | Status: DC

## 2017-01-04 MED ORDER — PANTOPRAZOLE SODIUM 40 MG PO TBEC: 40.0000 mg | DELAYED_RELEASE_TABLET | Freq: Two times a day (BID) | ORAL | 3 refills | Status: DC

## 2017-01-04 NOTE — Progress Notes (Signed)
Subjective:    Patient ID: Donald Merritt, male    DOB: 24-Jul-1986, 31 y.o.   MRN: [id number]  HPI Here for f/u of chronic medical problems   Doing fair overall   Wt Readings from Last 3 Encounters:  01/04/17 187 lb 8 oz (85 kg)  01/20/16 181 lb 6.4 oz (82.3 kg)  12/09/15 175 lb 3.2 oz (79.5 kg)  was prev over 200 lb -very happy with that - gave up midnight snacks / cookies and milk  bmi 27.6   Smoking status - quit and started back, almost ready to try again  Cold [place] works best  Started back when stressed- working on better Producer, television/film/video  Quit alcohol as well after a short period of excessive intake   GERD Had EGD 5/14- found gastritis (non erosive) Labs for celiac were neg   Takes protonix twice daily -it helps  That helps a lot-= no longer sick in the am (if he does not take it he gets n/v)  [employer] - avoids spicy foods  No problems with [place] food or sauces     Also has IBS On robinul 2 mg twice daily -helps much with the nausea and helps digestion  Has hyoscamine for prn use - does not need often   Does not think he needs any changes   BP Readings from Last 3 Encounters:  01/04/17 130/78  01/20/16 124/68  12/09/15 120/70    Eating healthier Eating yogurt  chia seeds Needs more green vegetables    Patient Active Problem List   Diagnosis Date Noted  . IBS (irritable bowel syndrome) 01/04/2017  . Smoking 07/22/2015  . Gastritis 05/15/2015  . Hypertriglyceridemia 03/13/2014  . Screening for lipoid disorders 03/05/2014  . Gastroenteritis 02/23/2014  . GERD (gastroesophageal reflux disease) 09/04/2011  . History of kidney stones 03/12/2010  . MICROSCOPIC HEMATURIA 03/12/2010  . [place], PERSISTENT 03/15/2008  . Allergic rhinitis 02/13/2008  . DEPRESSION 04/27/2007   Past Medical History:  Diagnosis Date  . Depression   . Environmental allergies   . GERD (gastroesophageal reflux disease)   . Other and unspecified noninfectious  gastroenteritis and colitis(558.9)   . Other malaise and fatigue   . Panic attack   . Renal stones   . Tobacco use disorder   . Unspecified sleep apnea    Past Surgical History:  Procedure Laterality Date  . neg hx     Social History  Substance Use Topics  . Smoking status: Current Every Day Smoker    Packs/day: 0.50    Years: 15.00    Types: Cigarettes  . Smokeless tobacco: Former Systems developer    Types: Snuff    Quit date: 10/20/2011     Comment: form given 12-09-15  . Alcohol use 0.0 oz/week     Comment: occasionally   Family History  Problem Relation Age of Onset  . Asthma Mother   . Hypertension Father   . Heart disease Paternal Grandmother   . Diabetes Paternal Grandmother   . Lactose intolerance Daughter   . Colon cancer Neg Hx   . Esophageal cancer Neg Hx   . Bone cancer Paternal Grandfather   . Pancreatic cancer Neg Hx   . Stomach cancer Neg Hx   . Kidney disease Neg Hx   . Liver disease Neg Hx    Allergies  Allergen Reactions  . Chantix [Varenicline]   . Betadine [Povidone Iodine] Rash   Current Outpatient Prescriptions on File Prior to Visit  Medication  Sig Dispense Refill  . fexofenadine-pseudoephedrine (ALLEGRA-D 24) 180-240 MG per 24 hr tablet Take 1 tablet by mouth daily.      No current facility-administered medications on file prior to visit.     Review of Systems Review of Systems  Constitutional: Negative for fever, appetite change, fatigue and unexpected weight change.  Eyes: Negative for pain and visual disturbance.  Respiratory: Negative for cough and shortness of breath.   Cardiovascular: Negative for cp or palpitations    Gastrointestinal: Negative for nausea, vomiting, blood in stool or dark stool, neg for abd pain  Genitourinary: Negative for urgency and frequency.  Skin: Negative for pallor or rash   Neurological: Negative for weakness, light-headedness, numbness and headaches.  Hematological: Negative for adenopathy. Does not bruise/bleed  easily.  Psychiatric/Behavioral: Negative for dysphoric mood. The patient is not nervous/anxious.         Objective:   Physical Exam  Constitutional: He appears well-developed and well-nourished. No distress.  Well appearing   HENT:  Head: Normocephalic and atraumatic.  Mouth/Throat: Oropharynx is clear and moist.  Eyes: Conjunctivae and EOM are normal. Pupils are equal, round, and reactive to light. No scleral icterus.  Neck: Normal range of motion. Neck supple.  Cardiovascular: Normal rate, regular rhythm and normal heart sounds.   Pulmonary/Chest: Effort normal and breath sounds normal. No respiratory distress. He has no wheezes. He has no rales.  Good air exch without wheezing   Abdominal: Soft. Bowel sounds are normal. He exhibits no distension and no mass. There is no hepatosplenomegaly. There is no tenderness. There is no rebound and no guarding.  Lymphadenopathy:    He has no cervical adenopathy.  Neurological: He is alert.  Skin: Skin is warm and dry. No erythema. No pallor.  No jaundice or pallor   Psychiatric: He has a normal mood and affect.          Assessment & Plan:   Problem List Items Addressed This Visit      Respiratory   Allergic rhinitis    Pt continues allegra D prn otc        Digestive   GERD (gastroesophageal reflux disease) - Primary    Doing better with current protonix dosing bid (along with ibs meds that seem to help) Disc diet and what to avoid (less coffee) Enc smoking cessation  Rev EGD from 2014  Will continue current medications  Commended wt loss as well      Relevant Medications   glycopyrrolate (ROBINUL) 2 MG tablet   pantoprazole (PROTONIX) 40 MG tablet   hyoscyamine (LEVSIN SL) 0.125 MG SL tablet   IBS (irritable bowel syndrome)    Pt has been treated with Robinul 2 mg bid from GI Also levsin sl prn (which he rarely needs) Disc diet and fiber intake Better control currently      Relevant Medications   glycopyrrolate  (ROBINUL) 2 MG tablet   pantoprazole (PROTONIX) 40 MG tablet   hyoscyamine (LEVSIN SL) 0.125 MG SL tablet     Other   Smoking    Pt has quit and started back and plans to quit again  Disc in detail risks of smoking and possible outcomes including copd, vascular/ heart disease, cancer , respiratory and sinus infections  Pt voices understanding No quit date yet  Plans to go cold [place]  Disc the barriers to success and planning other ways to deal with stress

## 2017-01-04 NOTE — Assessment & Plan Note (Signed)
Pt continues allegra D prn otc

## 2017-01-04 NOTE — Assessment & Plan Note (Signed)
Doing better with current protonix dosing bid (along with ibs meds that seem to help) Disc diet and what to avoid (less coffee) Enc smoking cessation  Rev EGD from 2014  Will continue current medications  Commended wt loss as well

## 2017-01-04 NOTE — Progress Notes (Signed)
Pre visit review using our clinic review tool, if applicable. No additional management support is needed unless otherwise documented below in the visit note. 

## 2017-01-04 NOTE — Assessment & Plan Note (Signed)
Pt has been treated with Robinul 2 mg bid from GI Also levsin sl prn (which he rarely needs) Disc diet and fiber intake Better control currently

## 2017-01-04 NOTE — Patient Instructions (Addendum)
Keep trying to quit smoking   Watch diet for things that bother stomach -don't overdo coffee or alcohol  Avoid ibuprofen as much as you can ( when you take it - take on a full stomach) Keep trying to eat healthy   No change in GI medicines   Take care of yourself

## 2017-01-04 NOTE — Assessment & Plan Note (Signed)
Pt has quit and started back and plans to quit again  Disc in detail risks of smoking and possible outcomes including copd, vascular/ heart disease, cancer , respiratory and sinus infections  Pt voices understanding No quit date yet  Plans to go cold [place]  Disc the barriers to success and planning other ways to deal with stress

## 2017-04-21 IMAGING — CT CT ABD-PELV W/ CM
2 of 4 series · 16 of 46 positions shown, 18 images · IV contrast (Omni 300)
Comparison: Abdominal pelvic CT 12/25/2011

CLINICAL DATA: 29-year-old with periumbilical abdominal pain,
nausea and vomiting for 5 days. History of esophagitis.

EXAM:
CT ABDOMEN AND PELVIS WITH CONTRAST
TECHNIQUE: Multidetector CT imaging of the abdomen and pelvis was performed
using the standard protocol following bolus administration of
intravenous contrast.
CONTRAST:  75mL OMNIPAQUE IOHEXOL 300 MG/ML  SOLN

[Series 2: a/p w/ 5mm · axial · 0.72mm/px · z∈[-766,-326]mm · 13 of 98 slices shown, 15 images]
[im 5/98  soft-tissue]
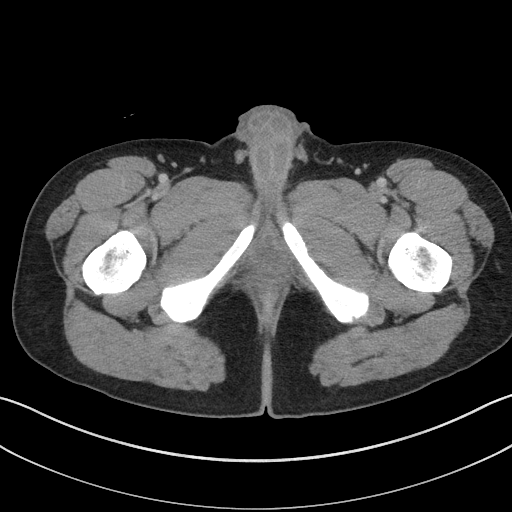
[im 5/98  bone]
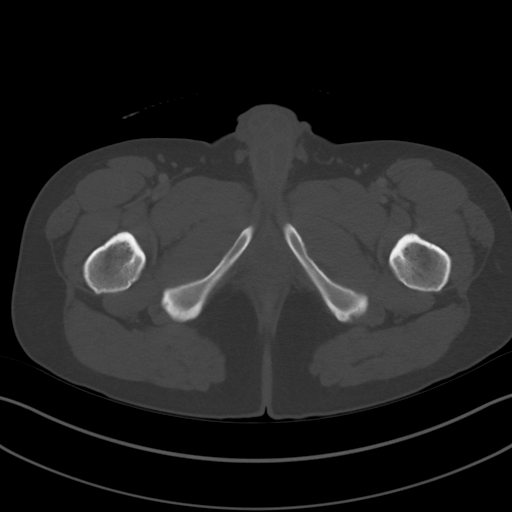
[im 13/98  soft-tissue]
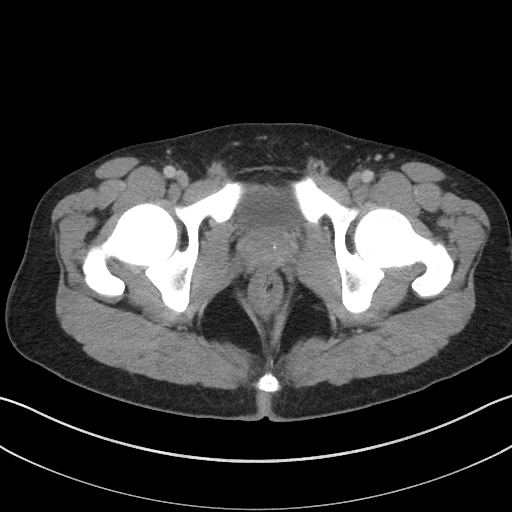
[im 21/98  soft-tissue]
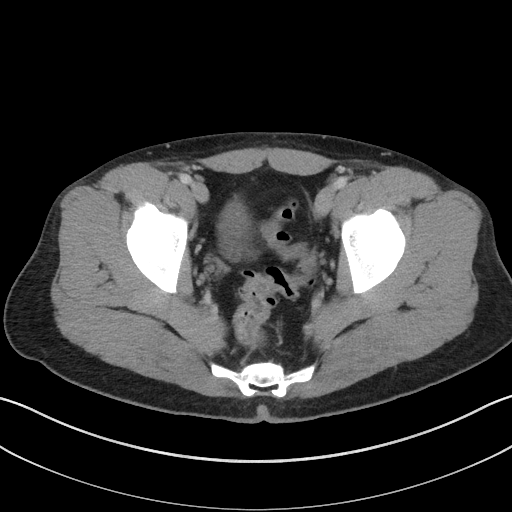
[im 29/98  soft-tissue]
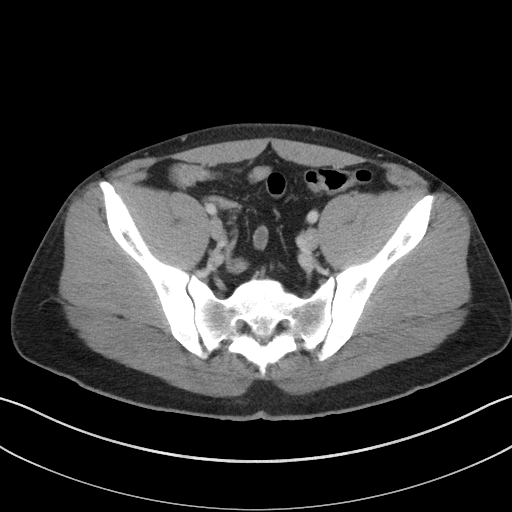
[im 33/98  soft-tissue]
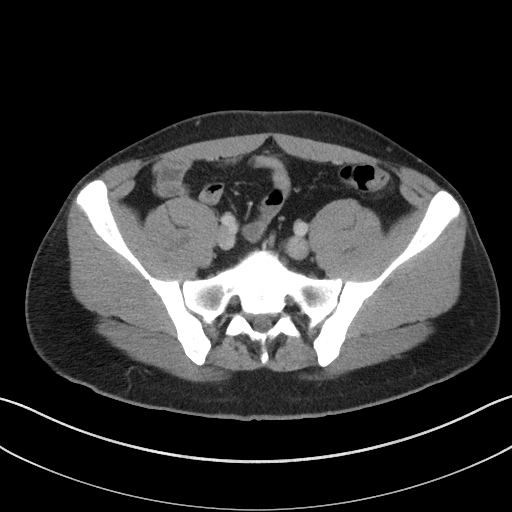
[im 41/98  soft-tissue]
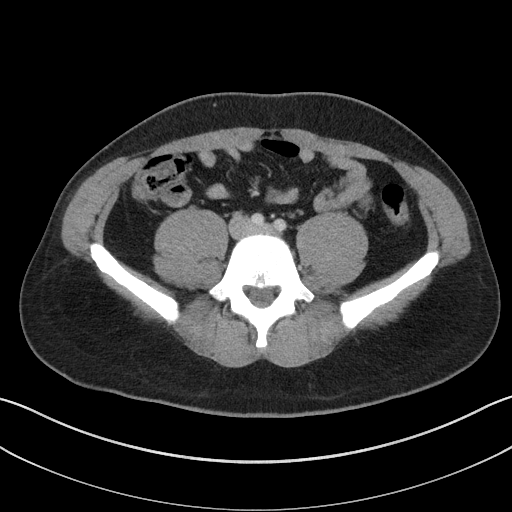
[im 49/98  soft-tissue]
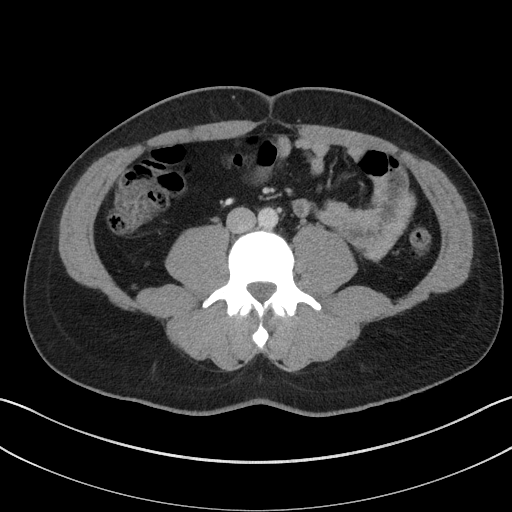
[im 57/98  soft-tissue]
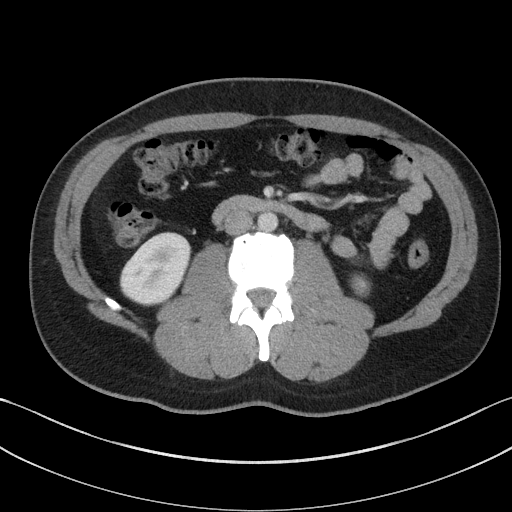
[im 65/98  soft-tissue]
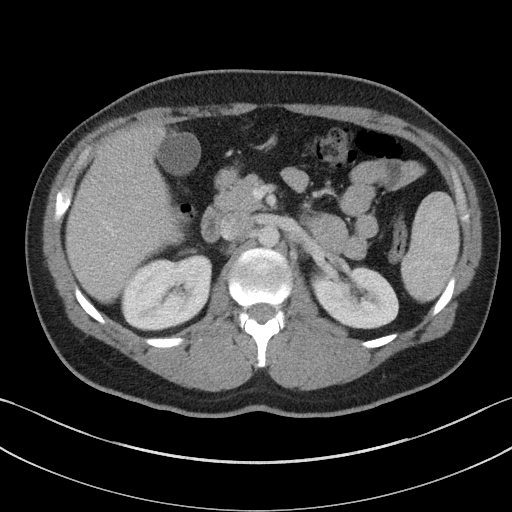
[im 65/98  bone]
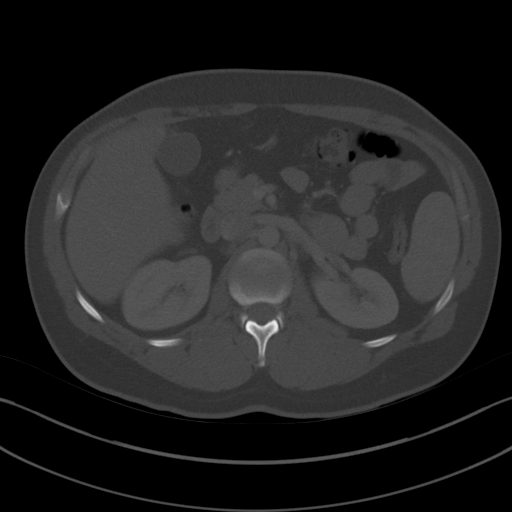
[im 69/98  soft-tissue]
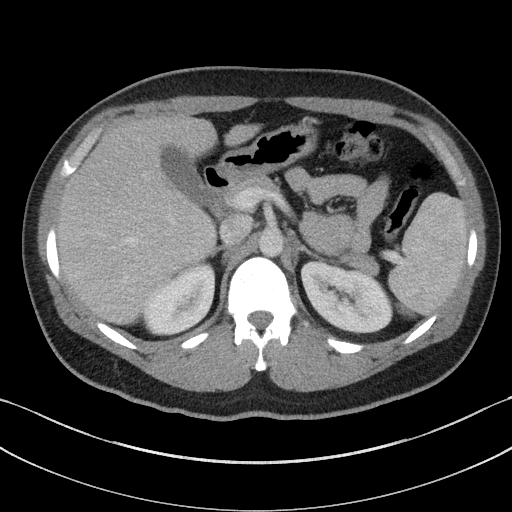
[im 77/98  soft-tissue]
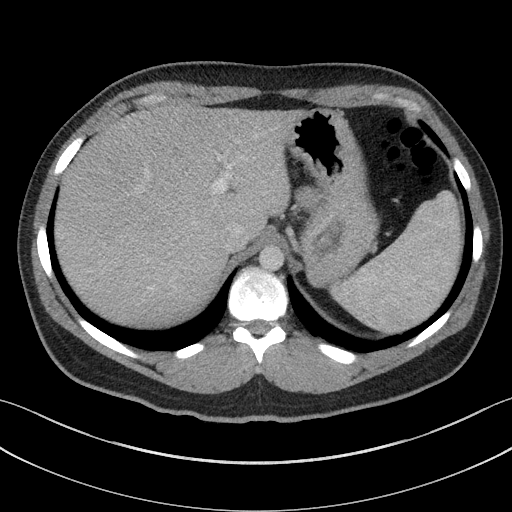
[im 85/98  soft-tissue]
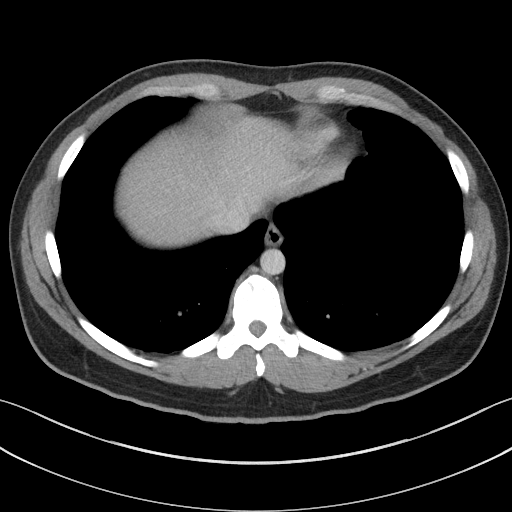
[im 93/98  soft-tissue]
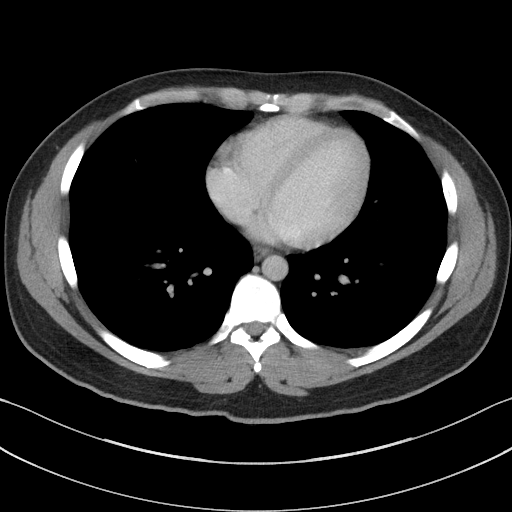

[Series 5: a/p w/ cor · coronal · 0.86mm/px · 3 of 159 slices shown]
[im 53/159  soft-tissue]
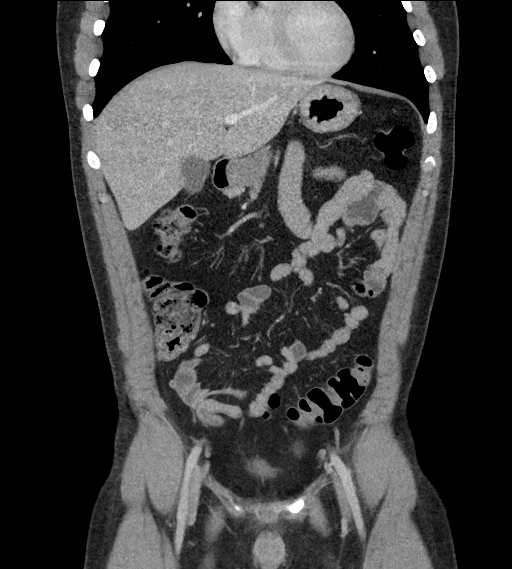
[im 71/159  soft-tissue]
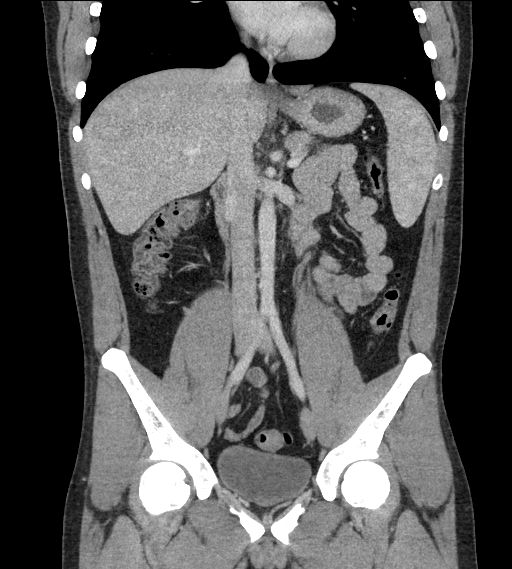
[im 88/159  soft-tissue]
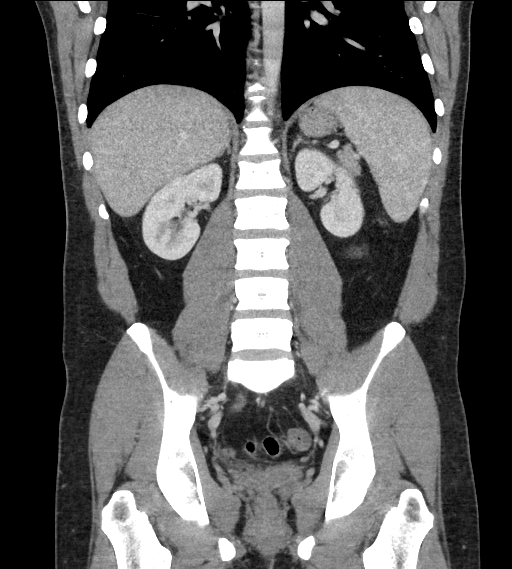

[16 of 46 positions shown; findings below may reference images not displayed]

FINDINGS: Lower chest: Minimal dependent atelectasis at both lung bases. There
is no significant pleural or pericardial effusion.

Hepatobiliary: Contrast bolus is less optimal than on the prior
study. Focal fat adjacent to the falciform ligament is grossly
stable. No new or enlarging hepatic lesions are identified. No
evidence of gallstones, gallbladder wall thickening or biliary
dilatation.

Pancreas: Unremarkable. No pancreatic ductal dilatation or
surrounding inflammatory changes.

Spleen: Normal in size without focal abnormality.

Adrenals/Urinary Tract: Both adrenal glands appear normal. The
kidneys appear normal without evidence of urinary tract calculus,
suspicious lesion or hydronephrosis. No bladder abnormalities are
seen.

Stomach/Bowel: No evidence of bowel wall thickening, distention or
surrounding inflammatory change. There are mild sigmoid colon
diverticular changes. The appendix appears normal.

Vascular/Lymphatic: There are no enlarged abdominal or pelvic lymph
nodes. No significant vascular findings are present.

Reproductive: The prostate gland is stable in size with central
dystrophic calcifications. The seminal vesicles appear normal.

Other: No evidence of abdominal wall mass or hernia.

Musculoskeletal: No acute or significant osseous findings. Stable L3
limbus vertebra.
IMPRESSION: 1. No acute findings or explanation for the patient's symptoms. The
appendix appears normal.
2. Mild sigmoid diverticulosis.

## 2017-04-26 ENCOUNTER — Other Ambulatory Visit: Payer: Self-pay | Admitting: Family Medicine

## 2017-09-21 ENCOUNTER — Encounter (HOSPITAL_COMMUNITY): Payer: Self-pay | Admitting: Emergency Medicine

## 2017-09-21 ENCOUNTER — Emergency Department (HOSPITAL_COMMUNITY): Payer: BLUE CROSS/BLUE SHIELD

## 2017-09-21 ENCOUNTER — Emergency Department (HOSPITAL_COMMUNITY)
Admission: EM | Admit: 2017-09-21 | Discharge: 2017-09-22 | Disposition: A | Discharge status: Home / Self Care | Payer: BLUE CROSS/BLUE SHIELD | Attending: Emergency Medicine | Admitting: Emergency Medicine

## 2017-09-21 DIAGNOSIS — K0889 Other specified disorders of teeth and supporting structures: Secondary | ICD-10-CM | POA: Diagnosis not present

## 2017-09-21 DIAGNOSIS — K029 Dental caries, unspecified: Secondary | ICD-10-CM

## 2017-09-21 DIAGNOSIS — Z79899 Other long term (current) drug therapy: Secondary | ICD-10-CM | POA: Insufficient documentation

## 2017-09-21 DIAGNOSIS — Z7982 Long term (current) use of aspirin: Secondary | ICD-10-CM | POA: Diagnosis not present

## 2017-09-21 DIAGNOSIS — R0789 Other chest pain: Secondary | ICD-10-CM | POA: Diagnosis not present

## 2017-09-21 DIAGNOSIS — R079 Chest pain, unspecified: Secondary | ICD-10-CM | POA: Diagnosis not present

## 2017-09-21 DIAGNOSIS — F17210 Nicotine dependence, cigarettes, uncomplicated: Secondary | ICD-10-CM | POA: Insufficient documentation

## 2017-09-21 LAB — CBC
HEMATOCRIT: 42.7 % (ref 39.0–52.0)
HEMOGLOBIN: 15.3 g/dL (ref 13.0–17.0)
MCH: 30.7 pg (ref 26.0–34.0)
MCHC: 35.8 g/dL (ref 30.0–36.0)
MCV: 85.6 fL (ref 78.0–100.0)
Platelets: 219 10*3/uL (ref 150–400)
RBC: 4.99 MIL/uL (ref 4.22–5.81)
RDW: 12.4 % (ref 11.5–15.5)
WBC: 11.7 10*3/uL — ABNORMAL HIGH (ref 4.0–10.5)

## 2017-09-21 LAB — I-STAT TROPONIN, ED
Troponin i, poc: 0 ng/mL (ref 0.00–0.08)
Troponin i, poc: 0 ng/mL (ref 0.00–0.08)

## 2017-09-21 LAB — BASIC METABOLIC PANEL
ANION GAP: 8 (ref 5–15)
BUN: 13 mg/dL (ref 6–20)
CALCIUM: 9.1 mg/dL (ref 8.9–10.3)
CO2: 25 mmol/L (ref 22–32)
Chloride: 106 mmol/L (ref 101–111)
Creatinine, Ser: 0.88 mg/dL (ref 0.61–1.24)
GFR calc Af Amer: >60 mL/min (ref >60)
GFR calc non Af Amer: >60 mL/min (ref >60)
GLUCOSE: 95 mg/dL (ref 65–99)
POTASSIUM: 3.8 mmol/L (ref 3.5–5.1)
Sodium: 139 mmol/L (ref 135–145)

## 2017-09-21 LAB — D-DIMER, QUANTITATIVE: D-Dimer, Quant: 0.32 ug/mL-FEU (ref 0.00–0.50)

## 2017-09-21 NOTE — ED Provider Notes (Signed)
[facility] EMERGENCY DEPARTMENT Provider Note   CSN: [id number] Arrival date & time: 09/21/17  1943     History   Chief Complaint Chief Complaint  Patient presents with  . Chest Pain    HPI Donald Merritt is a 31 y.o. male.  The history is provided by the patient. No language interpreter was used.  Chest Pain     Donald Merritt is a 31 y.o. male who presents to the Emergency Department complaining of chest pain.  He reports left-sided axillary chest pain that started about 5-530.  Pain is waxing and waning with associated tingling in his left hand.  Just prior to the pain beginning he did have some left-sided dental pain.  No diaphoresis, shortness of breath, nausea, vomiting.  A week ago he did have cramping in his left calf.  He does have a mild cough today.  He received aspirin and nitroglycerin by EMS and had mild improvement in his symptoms. Past Medical History:  Diagnosis Date  . Depression   . Environmental allergies   . GERD (gastroesophageal reflux disease)   . Other and unspecified noninfectious gastroenteritis and colitis(558.9)   . Other malaise and fatigue   . Panic attack   . Renal stones   . Tobacco use disorder   . Unspecified sleep apnea     Patient Active Problem List   Diagnosis Date Noted  . IBS (irritable bowel syndrome) 01/04/2017  . Smoking 07/22/2015  . Gastritis 05/15/2015  . Hypertriglyceridemia 03/13/2014  . Screening for lipoid disorders 03/05/2014  . Gastroenteritis 02/23/2014  . GERD (gastroesophageal reflux disease) 09/04/2011  . History of kidney stones 03/12/2010  . MICROSCOPIC HEMATURIA 03/12/2010  . [place], PERSISTENT 03/15/2008  . Allergic rhinitis 02/13/2008  . DEPRESSION 04/27/2007    Past Surgical History:  Procedure Laterality Date  . neg hx         Home Medications    Prior to Admission medications   Medication Sig Start Date End Date Taking? Authorizing Provider  aspirin 81 MG  chewable tablet Chew 324 mg by mouth once as needed (for chest pain).    Yes [provider]  fexofenadine (ALLEGRA) 180 MG tablet Take 180 mg by mouth daily.   Yes [provider]  glycopyrrolate (ROBINUL) 2 MG tablet Take 1 tablet (2 mg total) by mouth 2 (two) times daily. 01/04/17  Yes Tower, Wynelle Fanny, MD  hyoscyamine (LEVSIN SL) 0.125 MG SL tablet Take 1-2 tablets by mouth before meals Patient taking differently: Take 0.125-0.25 mg by mouth See admin instructions. "THREE TIMES A DAY BEFORE MEALS AS NEEDED FOR ABDOMINAL CRAMPING" 01/04/17  Yes Tower, Wynelle Fanny, MD  ibuprofen (ADVIL,MOTRIN) 200 MG tablet Take 800 mg by mouth every 6 (six) hours as needed (for headaches).   Yes [provider]  Multiple Vitamins-Minerals (MULTI ADULT GUMMIES) CHEW Chew 2 tablets by mouth every evening.   Yes [provider]  pantoprazole (PROTONIX) 40 MG tablet Take 1 tablet (40 mg total) by mouth 2 (two) times daily. 01/04/17  Yes Tower, Wynelle Fanny, MD  penicillin v potassium (VEETID) 500 MG tablet Take 1 tablet (500 mg total) by mouth 4 (four) times daily for 7 days. 09/22/17 09/29/17  Quintella Reichert, MD    Family History Family History  Problem Relation Age of Onset  . Asthma Mother   . Hypertension Father   . Heart disease Paternal Grandmother   . Diabetes Paternal Grandmother   . Lactose intolerance Daughter   .  Bone cancer Paternal Grandfather   . Colon cancer Neg Hx   . Esophageal cancer Neg Hx   . Pancreatic cancer Neg Hx   . Stomach cancer Neg Hx   . Kidney disease Neg Hx   . Liver disease Neg Hx     Social History Social History   Tobacco Use  . Smoking status: Current Every Day Smoker    Packs/day: 0.50    Years: 15.00    Pack years: 7.50    Types: Cigarettes  . Smokeless tobacco: Former Systems developer    Types: Snuff    Quit date: 10/20/2011  . Tobacco comment: form given 12-09-15  Substance Use Topics  . Alcohol use: Yes    Alcohol/week: 0.0 oz    Comment:  occasionally  . Drug use: No     Allergies   Chantix [varenicline] and Betadine [povidone iodine]   Review of Systems Review of Systems  Cardiovascular: Positive for chest pain.  All other systems reviewed and are negative.    Physical Exam Updated Vital Signs BP (!) 150/81 (BP Location: Right Arm)   Pulse 79   Temp 98.3 F (36.8 C) (Oral)   Resp 18   SpO2 98%   Physical Exam  Constitutional: He is oriented to person, place, and time. He appears well-developed and well-nourished.  HENT:  Head: Normocephalic and atraumatic.  Extensive dental carries with diffuse gingival induration and tenderness without dental abscess  Cardiovascular: Normal rate and regular rhythm.  No murmur heard. Pulmonary/Chest: Effort normal and breath sounds normal. No respiratory distress.  There is left axillary and upper anterior chest wall tenderness  Abdominal: Soft. There is no tenderness. There is no rebound and no guarding.  Musculoskeletal: He exhibits no edema or tenderness.  2+ radial pulses bilaterally  Neurological: He is alert and oriented to person, place, and time.  Skin: Skin is warm and dry.  Psychiatric: He has a normal mood and affect. His behavior is normal.  Nursing note and vitals reviewed.    ED Treatments / Results  Labs (all labs ordered are listed, but only abnormal results are displayed) Labs Reviewed  CBC - Abnormal; Notable for the following components:      Result Value   WBC 11.7 (*)    All other components within normal limits  BASIC METABOLIC PANEL  D-DIMER, QUANTITATIVE (NOT AT [facility])  I-STAT TROPONIN, ED  I-STAT TROPONIN, ED    EKG  EKG Interpretation  Date/Time:  Tuesday September 21 2017 20:02:47 EST Ventricular Rate:  73 PR Interval:  152 QRS Duration: 96 QT Interval:  396 QTC Calculation: 436 R Axis:   77 Text Interpretation:  Normal sinus rhythm Normal ECG Confirmed by Quintella Reichert [phone]) on 09/21/2017 8:13:16 PM        Radiology Dg Chest 2 View  Result Date: 09/21/2017 CLINICAL DATA:  Chest pain. EXAM: CHEST  2 VIEW COMPARISON:  Radiographs of September 02, 2011. FINDINGS: The heart size and mediastinal contours are within normal limits. Both lungs are clear. No pneumothorax or pleural effusion is noted. The visualized skeletal structures are unremarkable. IMPRESSION: No active cardiopulmonary disease. Electronically Signed   By: Marijo Conception, M.D.   On: 09/21/2017 21:17    Procedures Procedures (including critical care time)  Medications Ordered in ED Medications - No data to display   Initial Impression / Assessment and Plan / ED Course  I have reviewed the triage vital signs and the nursing notes.  Pertinent labs & imaging results that  were available during my care of the patient were reviewed by me and considered in my medical decision making (see chart for details).     She is here for evaluation of left-sided chest pain.  EKG with no acute ischemic changes.  Presentation is not consistent with ACS, PE, dissection.  In terms of dental pain that preceded his chest pain, and he does have rampant dental caries on examination with diffuse gingival tenderness with no focal abscess.  Will provide prescription for penicillin with dental resources for follow-up.  In terms of chest pain this is musculoskeletal in nature, discussed ibuprofen at home and PCP follow-up.  Final Clinical Impressions(s) / ED Diagnoses   Final diagnoses:  Chest wall pain  Pain due to dental caries    ED Discharge Orders        Ordered    penicillin v potassium (VEETID) 500 MG tablet  4 times daily     09/22/17 0006       Quintella Reichert, MD 09/22/17 [phone]

## 2017-09-21 NOTE — ED Triage Notes (Signed)
BIB EMS from home, reports sudden onset of L sided CP with radiation to L arm, also reports toothache. Rates 8/10, sharp/stabbing in nature. Given 324 ASA and 3NTG with relief. VSS. No cardiac hx

## 2017-09-21 NOTE — ED Notes (Signed)
Patient transported to X-ray 

## 2017-09-22 MED ORDER — PENICILLIN V POTASSIUM 500 MG PO TABS: 500.0000 mg | ORAL_TABLET | Freq: Four times a day (QID) | ORAL | 0 refills | Status: AC

## 2017-09-22 NOTE — ED Notes (Signed)
ED Provider at bedside. 

## 2017-11-27 ENCOUNTER — Other Ambulatory Visit: Payer: Self-pay

## 2017-11-27 ENCOUNTER — Encounter (HOSPITAL_COMMUNITY): Payer: Self-pay

## 2017-11-27 ENCOUNTER — Emergency Department (HOSPITAL_COMMUNITY)
Admission: EM | Admit: 2017-11-27 | Discharge: 2017-11-27 | Disposition: A | Discharge status: Home / Self Care | Payer: BLUE CROSS/BLUE SHIELD | Attending: Emergency Medicine | Admitting: Emergency Medicine

## 2017-11-27 DIAGNOSIS — Z79899 Other long term (current) drug therapy: Secondary | ICD-10-CM | POA: Insufficient documentation

## 2017-11-27 DIAGNOSIS — F17210 Nicotine dependence, cigarettes, uncomplicated: Secondary | ICD-10-CM | POA: Diagnosis not present

## 2017-11-27 DIAGNOSIS — J029 Acute pharyngitis, unspecified: Secondary | ICD-10-CM | POA: Diagnosis not present

## 2017-11-27 DIAGNOSIS — Z7982 Long term (current) use of aspirin: Secondary | ICD-10-CM | POA: Insufficient documentation

## 2017-11-27 LAB — RAPID STREP SCREEN (MED CTR MEBANE ONLY): Streptococcus, Group A Screen (Direct): NEGATIVE

## 2017-11-27 MED ORDER — PHENOL 1.4 % MT LIQD: 1.0000 | OROMUCOSAL | 0 refills | Status: DC | PRN

## 2017-11-27 MED ORDER — FLUTICASONE PROPIONATE 50 MCG/ACT NA SUSP: 1.0000 | Freq: Every day | NASAL | 0 refills | Status: DC

## 2017-11-27 MED ORDER — OSELTAMIVIR PHOSPHATE 75 MG PO CAPS: 75.0000 mg | ORAL_CAPSULE | Freq: Two times a day (BID) | ORAL | 0 refills | Status: DC

## 2017-11-27 MED ORDER — PROMETHAZINE-DM 6.25-15 MG/5ML PO SYRP: 5.0000 mL | ORAL_SOLUTION | Freq: Four times a day (QID) | ORAL | 0 refills | Status: DC | PRN

## 2017-11-27 NOTE — ED Provider Notes (Signed)
[facility] EMERGENCY DEPARTMENT Provider Note   CSN: [id number] Arrival date & time: 11/27/17  1723     History   Chief Complaint Chief Complaint  Patient presents with  . Sore Throat    HPI Donald Merritt is a 32 y.o. male presenting for evaluation of sore throat.   Pt states that he woke up this morning with sore throat. He has associated genalized weakness/tiredness, mild cough, and R ear pain. He reports chills without fevers. He denies nasal congestion, cough, CP, SOB. He has had x4 episodes of diarrhea today, although states he frequently has stomach problems. He denies N/V, abd pain, or urinary sxs. He has tried dayquil without improvement of sxs. Multiple family members at home with similar sxs that have started over the past week. Wife is being tx for flu. He denies h/o asthma or COPD.   HPI  Past Medical History:  Diagnosis Date  . Depression   . Environmental allergies   . GERD (gastroesophageal reflux disease)   . Other and unspecified noninfectious gastroenteritis and colitis(558.9)   . Other malaise and fatigue   . Panic attack   . Renal stones   . Tobacco use disorder   . Unspecified sleep apnea     Patient Active Problem List   Diagnosis Date Noted  . IBS (irritable bowel syndrome) 01/04/2017  . Smoking 07/22/2015  . Gastritis 05/15/2015  . Hypertriglyceridemia 03/13/2014  . Screening for lipoid disorders 03/05/2014  . Gastroenteritis 02/23/2014  . GERD (gastroesophageal reflux disease) 09/04/2011  . History of kidney stones 03/12/2010  . MICROSCOPIC HEMATURIA 03/12/2010  . [place], PERSISTENT 03/15/2008  . Allergic rhinitis 02/13/2008  . DEPRESSION 04/27/2007    Past Surgical History:  Procedure Laterality Date  . neg hx         Home Medications    Prior to Admission medications   Medication Sig Start Date End Date Taking? Authorizing Provider  aspirin 81 MG chewable tablet Chew 324 mg by mouth once as needed (for  chest pain).     [provider]  fexofenadine (ALLEGRA) 180 MG tablet Take 180 mg by mouth daily.    [provider]  fluticasone (FLONASE) 50 MCG/ACT nasal spray Place 1 spray into both nostrils daily. 11/27/17   Nikolai Wilczak, PA-C  glycopyrrolate (ROBINUL) 2 MG tablet Take 1 tablet (2 mg total) by mouth 2 (two) times daily. 01/04/17   Tower, Wynelle Fanny, MD  hyoscyamine (LEVSIN SL) 0.125 MG SL tablet Take 1-2 tablets by mouth before meals Patient taking differently: Take 0.125-0.25 mg by mouth See admin instructions. "THREE TIMES A DAY BEFORE MEALS AS NEEDED FOR ABDOMINAL CRAMPING" 01/04/17   Tower, Wynelle Fanny, MD  ibuprofen (ADVIL,MOTRIN) 200 MG tablet Take 800 mg by mouth every 6 (six) hours as needed (for headaches).    [provider]  Multiple Vitamins-Minerals (MULTI ADULT GUMMIES) CHEW Chew 2 tablets by mouth every evening.    [provider]  oseltamivir (TAMIFLU) 75 MG capsule Take 1 capsule (75 mg total) by mouth every 12 (twelve) hours. 11/27/17   Tjay Velazquez, PA-C  pantoprazole (PROTONIX) 40 MG tablet Take 1 tablet (40 mg total) by mouth 2 (two) times daily. 01/04/17   Tower, Wynelle Fanny, MD  phenol (CHLORASEPTIC) 1.4 % LIQD Use as directed 1 spray in the mouth or throat as needed for throat irritation / pain. 11/27/17   Sharika Mosquera, PA-C  promethazine-dextromethorphan (PROMETHAZINE-DM) 6.25-15 MG/5ML syrup Take 5 mLs by mouth 4 (four)  times daily as needed. 11/27/17   Roniyah Llorens, PA-C    Family History Family History  Problem Relation Age of Onset  . Asthma Mother   . Hypertension Father   . Heart disease Paternal Grandmother   . Diabetes Paternal Grandmother   . Lactose intolerance Daughter   . Bone cancer Paternal Grandfather   . Colon cancer Neg Hx   . Esophageal cancer Neg Hx   . Pancreatic cancer Neg Hx   . Stomach cancer Neg Hx   . Kidney disease Neg Hx   . Liver disease Neg Hx     Social History Social History   Tobacco  Use  . Smoking status: Current Every Day Smoker    Packs/day: 0.50    Years: 15.00    Pack years: 7.50    Types: Cigarettes  . Smokeless tobacco: Former Systems developer    Types: Snuff    Quit date: 10/20/2011  . Tobacco comment: form given 12-09-15  Substance Use Topics  . Alcohol use: Yes    Alcohol/week: 0.0 oz    Comment: occasionally  . Drug use: No     Allergies   Chantix [varenicline] and Betadine [povidone iodine]   Review of Systems Review of Systems  Constitutional: Positive for chills. Negative for fever.  HENT: Positive for ear pain and sore throat. Negative for congestion.   Respiratory: Negative for cough and shortness of breath.   Cardiovascular: Negative for chest pain.  Gastrointestinal: Positive for diarrhea. Negative for abdominal pain, nausea and vomiting.     Physical Exam Updated Vital Signs BP 132/76   Pulse 79   Temp 98.9 F (37.2 C)   Resp 18   Wt 83.9 kg (185 lb)   SpO2 100%   BMI 27.32 kg/m   Physical Exam  Constitutional: He is oriented to person, place, and time. He appears well-developed and well-nourished. No distress.  HENT:  Head: Normocephalic and atraumatic.  Right Ear: Tympanic membrane, external ear and ear canal normal.  Left Ear: Tympanic membrane, external ear and ear canal normal.  Nose: Nose normal. Right sinus exhibits no maxillary sinus tenderness and no frontal sinus tenderness. Left sinus exhibits no maxillary sinus tenderness and no frontal sinus tenderness.  Mouth/Throat: Uvula is midline, oropharynx is clear and moist and mucous membranes are normal. No tonsillar exudate.  OP clear without tonsillar swelling or exudate.  Uvula midline with equal palate rise.  TMs nonerythematous and nonbulging bilaterally.  No obvious nasal mucosal edema or tenderness palpation of the sinuses.  Eyes: Conjunctivae and EOM are normal. Pupils are equal, round, and reactive to light.  Neck: Normal range of motion.  Cardiovascular: Normal rate,  regular rhythm and intact distal pulses.  Pulmonary/Chest: Effort normal and breath sounds normal. He has no decreased breath sounds. He has no wheezes. He has no rhonchi. He has no rales.  Pt speaking in full sentences without difficulty. Clear lung sounds in all fields.  Abdominal: Soft. He exhibits no distension and no mass. There is no tenderness. There is no guarding.  No tenderness palpation of the abdomen.  Abdomen is soft, without rigidity or guarding.  Musculoskeletal: Normal range of motion.  Lymphadenopathy:    He has no cervical adenopathy.  Neurological: He is alert and oriented to person, place, and time.  Skin: Skin is warm.  Psychiatric: He has a normal mood and affect.  Nursing note and vitals reviewed.    ED Treatments / Results  Labs (all labs ordered are listed, but  only abnormal results are displayed) Labs Reviewed  RAPID STREP SCREEN (NOT AT [facility])  CULTURE, GROUP A STREP [facility])    EKG  EKG Interpretation None       Radiology No results found.  Procedures Procedures (including critical care time)  Medications Ordered in ED Medications - No data to display   Initial Impression / Assessment and Plan / ED Course  I have reviewed the triage vital signs and the nursing notes.  Pertinent labs & imaging results that were available during my care of the patient were reviewed by me and considered in my medical decision making (see chart for details).     Patient presenting with 1 day h/o sore throat.  Physical exam reassuring, patient is afebrile and appears nontoxic.  Pulmonary exam reassuring.  Doubt pneumonia, strep, other bacterial infection, or peritonsillar abscess. Rapid strep negative. Doubt flu, however pt requesting tamiflu. As wife is currently being tx and pt is within the 16 hr window, will rx. Likely viral URI.  Will treat symptomatically.  Patient to follow-up with primary care as needed.  At this time, patient appears safe for discharge.   Return precautions given.  Patient states he understands and agrees to plan.   Final Clinical Impressions(s) / ED Diagnoses   Final diagnoses:  Pharyngitis, unspecified etiology    ED Discharge Orders        Ordered    oseltamivir (TAMIFLU) 75 MG capsule  Every 12 hours     11/27/17 1839    phenol (CHLORASEPTIC) 1.4 % LIQD  As needed     11/27/17 1839    promethazine-dextromethorphan (PROMETHAZINE-DM) 6.25-15 MG/5ML syrup  4 times daily PRN     11/27/17 1839    fluticasone (FLONASE) 50 MCG/ACT nasal spray  Daily     11/27/17 1839       Franchot Heidelberg, PA-C 11/27/17 1854    Quintella Reichert, MD 11/28/17 1456

## 2017-11-27 NOTE — ED Triage Notes (Signed)
Pt presents to the ed with flu like symptoms and sore throat x 2 days

## 2017-11-27 NOTE — ED Notes (Signed)
Declined W/C at D/C and was escorted to lobby by RN. 

## 2017-11-27 NOTE — Discharge Instructions (Signed)
You likely have a viral illness.  This should be treated symptomatically. Use Tylenol or ibuprofen as needed for fevers or body aches. Take tamiflu twice a day as prescribed. Use Flonase daily for nasal congestion and cough. Use cough syrup or dayquil/nyquil as needed for cough. Use sore throat spray as needed.  Make sure you stay well-hydrated with water. Wash your hands frequently to prevent spread of infection. Follow-up with your primary care doctor in 5 days if your symptoms are not improving. Return to the emergency room if you develop chest pain, difficulty breathing, or any new or worsening symptoms.

## 2017-11-30 LAB — CULTURE, GROUP A STREP (THRC)

## 2017-12-20 ENCOUNTER — Other Ambulatory Visit: Payer: Self-pay

## 2017-12-20 ENCOUNTER — Ambulatory Visit (HOSPITAL_COMMUNITY)
Admission: EM | Admit: 2017-12-20 | Discharge: 2017-12-20 | Disposition: A | Discharge status: Home / Self Care | Payer: BLUE CROSS/BLUE SHIELD | Attending: Urgent Care | Admitting: Urgent Care

## 2017-12-20 ENCOUNTER — Encounter (HOSPITAL_COMMUNITY): Payer: Self-pay | Admitting: Emergency Medicine

## 2017-12-20 DIAGNOSIS — K047 Periapical abscess without sinus: Secondary | ICD-10-CM | POA: Diagnosis not present

## 2017-12-20 DIAGNOSIS — H9201 Otalgia, right ear: Secondary | ICD-10-CM

## 2017-12-20 DIAGNOSIS — K0889 Other specified disorders of teeth and supporting structures: Secondary | ICD-10-CM

## 2017-12-20 DIAGNOSIS — R51 Headache: Secondary | ICD-10-CM

## 2017-12-20 DIAGNOSIS — R519 Headache, unspecified: Secondary | ICD-10-CM

## 2017-12-20 MED ORDER — AMOXICILLIN-POT CLAVULANATE 875-125 MG PO TABS: 1.0000 | ORAL_TABLET | Freq: Two times a day (BID) | ORAL | 0 refills | Status: DC

## 2017-12-20 NOTE — ED Triage Notes (Signed)
Pt has a dental history and has had URI symptoms for over one week.  He started having right ear pain over one week ago and he states the pressure went down into his right jaw.  He states it started to get better, but then he woke up last night with right dental pain.  He states he does have to get some teeth pulled but he is waiting for his tax check to come in to have it done.

## 2017-12-20 NOTE — ED Provider Notes (Signed)
  MRN: [id number] DOB: 08/02/1986  Subjective:   Donald Merritt is a 32 y.o. male presenting for 2 day history of severe right sided tooth pain, right facial pain radiating to his right ear. He has been using ibuprofen for pain and inflammation. Has seen a dentist and had to save money for oral surgery, has procedure scheduled in 2 weeks.   No current facility-administered medications for this encounter.    Current Outpatient Medications  Medication Sig Dispense Refill  . fexofenadine (ALLEGRA) 180 MG tablet Take 180 mg by mouth daily.    . fluticasone (FLONASE) 50 MCG/ACT nasal spray Place 1 spray into both nostrils daily. 16 g 0  . glycopyrrolate (ROBINUL) 2 MG tablet Take 1 tablet (2 mg total) by mouth 2 (two) times daily. 180 tablet 3  . hyoscyamine (LEVSIN SL) 0.125 MG SL tablet Take 1-2 tablets by mouth before meals (Patient taking differently: Take 0.125-0.25 mg by mouth See admin instructions. "THREE TIMES A DAY BEFORE MEALS AS NEEDED FOR ABDOMINAL CRAMPING") 30 tablet 2  . ibuprofen (ADVIL,MOTRIN) 200 MG tablet Take 800 mg by mouth every 6 (six) hours as needed (for headaches).    . Multiple Vitamins-Minerals (MULTI ADULT GUMMIES) CHEW Chew 2 tablets by mouth every evening.    . pantoprazole (PROTONIX) 40 MG tablet Take 1 tablet (40 mg total) by mouth 2 (two) times daily. 180 tablet 3  . amoxicillin-clavulanate (AUGMENTIN) 875-125 MG tablet Take 1 tablet by mouth 2 (two) times daily with a meal. 20 tablet 0  . aspirin 81 MG chewable tablet Chew 324 mg by mouth once as needed (for chest pain).     Marland Kitchen oseltamivir (TAMIFLU) 75 MG capsule Take 1 capsule (75 mg total) by mouth every 12 (twelve) hours. 10 capsule 0  . phenol (CHLORASEPTIC) 1.4 % LIQD Use as directed 1 spray in the mouth or throat as needed for throat irritation / pain. 177 mL 0  . promethazine-dextromethorphan (PROMETHAZINE-DM) 6.25-15 MG/5ML syrup Take 5 mLs by mouth 4 (four) times daily as needed. 118 mL 0   Donald Merritt is  allergic to chantix [varenicline] and betadine [povidone iodine].  Donald Merritt  has a past medical history of Depression, Environmental allergies, GERD (gastroesophageal reflux disease), Other and unspecified noninfectious gastroenteritis and colitis(558.9), Other malaise and fatigue, Panic attack, Renal stones, Tobacco use disorder, and Unspecified sleep apnea. Also  has a past surgical history that includes neg hx. His family history includes Asthma in his mother; Bone cancer in his paternal grandfather; Diabetes in his paternal grandmother; Heart disease in his paternal grandmother; Hypertension in his father; Lactose intolerance in his daughter.   Objective:   Vitals: BP (!) 146/88 (BP Location: Left Arm)   Pulse 64   Temp 98.2 F (36.8 C) (Oral)   SpO2 99%   Physical Exam  Constitutional: He is oriented to person, place, and time. He appears well-developed and well-nourished.  HENT:  Mouth/Throat: Oropharynx is clear and moist. Dental caries present.    Right sided facial pain, submandibular lymph node tenderness without lymphadenopathy. TM's normal.  Cardiovascular: Normal rate.  Pulmonary/Chest: Effort normal.  Neurological: He is alert and oriented to person, place, and time.   Assessment and Plan :   Tooth infection  Tooth pain  Right facial pain  Right ear pain  Start Augmentin, use APAP with ibuprofen. Keep appointment for oral surgery. Return-to-clinic precautions discussed, patient verbalized understanding.    Jaynee Eagles, [place] 12/20/17 2031

## 2017-12-20 NOTE — Discharge Instructions (Signed)
You may take 500mg  Tylenol with ibuprofen 400-600mg  every 6 hours for pain and inflammation. Chew using your left side only. Make sure you keep your appointment with your dentist.

## 2018-06-02 ENCOUNTER — Encounter: Payer: Self-pay | Admitting: Family Medicine

## 2018-06-02 ENCOUNTER — Ambulatory Visit: Payer: BLUE CROSS/BLUE SHIELD | Admitting: Family Medicine

## 2018-06-02 VITALS — BP 138/82 | HR 69 | Temp 97.6°F | Ht 69.0 in | Wt 177.8 lb

## 2018-06-02 DIAGNOSIS — R1084 Generalized abdominal pain: Secondary | ICD-10-CM

## 2018-06-02 DIAGNOSIS — R109 Unspecified abdominal pain: Secondary | ICD-10-CM | POA: Insufficient documentation

## 2018-06-02 DIAGNOSIS — F17200 Nicotine dependence, unspecified, uncomplicated: Secondary | ICD-10-CM | POA: Diagnosis not present

## 2018-06-02 LAB — COMPREHENSIVE METABOLIC PANEL
ALT: 18 U/L (ref 0–53)
AST: 13 U/L (ref 0–37)
Albumin: 4.6 g/dL (ref 3.5–5.2)
Alkaline Phosphatase: 74 U/L (ref 39–117)
BILIRUBIN TOTAL: 0.6 mg/dL (ref 0.2–1.2)
BUN: 13 mg/dL (ref 6–23)
CO2: 29 mEq/L (ref 19–32)
CREATININE: 1.14 mg/dL (ref 0.40–1.50)
Calcium: 10.1 mg/dL (ref 8.4–10.5)
Chloride: 103 mEq/L (ref 96–112)
GFR: 78.43 mL/min (ref >60.00)
GLUCOSE: 108 mg/dL — AB (ref 70–99)
Potassium: 4.3 mEq/L (ref 3.5–5.1)
SODIUM: 138 meq/L (ref 135–145)
TOTAL PROTEIN: 7.4 g/dL (ref 6.0–8.3)

## 2018-06-02 LAB — CBC WITH DIFFERENTIAL/PLATELET
BASOS ABS: 0.1 10*3/uL (ref 0.0–0.1)
Basophils Relative: 0.7 % (ref 0.0–3.0)
EOS ABS: 0.5 10*3/uL (ref 0.0–0.7)
Eosinophils Relative: 5.3 % — ABNORMAL HIGH (ref 0.0–5.0)
HCT: 47.7 % (ref 39.0–52.0)
Hemoglobin: 16.7 g/dL (ref 13.0–17.0)
Lymphocytes Relative: 19.8 % (ref 12.0–46.0)
Lymphs Abs: 1.8 10*3/uL (ref 0.7–4.0)
MCHC: 35 g/dL (ref 30.0–36.0)
MCV: 86.8 fl (ref 78.0–100.0)
MONO ABS: 0.4 10*3/uL (ref 0.1–1.0)
MONOS PCT: 4.9 % (ref 3.0–12.0)
NEUTROS PCT: 69.3 % (ref 43.0–77.0)
Neutro Abs: 6.3 10*3/uL (ref 1.4–7.7)
Platelets: 199 10*3/uL (ref 150.0–400.0)
RBC: 5.49 Mil/uL (ref 4.22–5.81)
RDW: 13 % (ref 11.5–15.5)
WBC: 9.1 10*3/uL (ref 4.0–10.5)

## 2018-06-02 LAB — LIPASE: Lipase: 10 U/L — ABNORMAL LOW (ref 11.0–59.0)

## 2018-06-02 MED ORDER — SUCRALFATE 1 G PO TABS: 1.0000 g | ORAL_TABLET | Freq: Three times a day (TID) | ORAL | 0 refills | Status: DC | PRN

## 2018-06-02 NOTE — Assessment & Plan Note (Signed)
Worst in epigastric area (rad to back) in pt with past hx of gastritis  No stool changes  Takes protonix bid No current nsaids  Labs now/cbc /lipase/cmet  carafate px (helpes in the past) tid with meals-disc how to take Consider imaging depending on lab results and response to carafate

## 2018-06-02 NOTE — Patient Instructions (Signed)
Keep taking your GI medicines  Labs today  We will make a plan after that   Drink fluids Stick to a bland diet   Try carafate just to see if it helps   If suddenly worse-go to the ER Start checking your temperature

## 2018-06-02 NOTE — Assessment & Plan Note (Signed)
Disc in detail risks of smoking and possible outcomes including copd, vascular/ heart disease, cancer , respiratory and sinus infections  Pt voices understanding  

## 2018-06-02 NOTE — Progress Notes (Signed)
Subjective:    Patient ID: Donald Merritt, male    DOB: 04-26-1986, 32 y.o.   MRN: [id number]  HPI  Here for c/o of GI problems   Wt Readings from Last 3 Encounters:  06/02/18 177 lb 12 oz (80.6 kg)  11/27/17 185 lb (83.9 kg)  01/04/17 187 lb 8 oz (85 kg)   26.25 kg/m   Feeling bad since Monday  Nauseated  Clammy  Tired  Upper abdomen hurts and rad to the back  No appetite  Had chills but not fever  A little achey  No endurance   No tick bites No vomiting  Some bloating  Perhaps a little constipated (no blood in stool or black stool)   No sob  Some smokers cough-no change   No hx of AAA -last cxr was 12/18  Drinks little to no alcohol   Still takes protonix and levsin and robinul  Doing fair until Monday   Has had a lot of stress  Some mood/depression issues  Not in danger Still smoking   Temp: 97.6 F (36.4 C)    Patient Active Problem List   Diagnosis Date Noted  . Abdominal pain 06/02/2018  . IBS (irritable bowel syndrome) 01/04/2017  . Smoking 07/22/2015  . Gastritis 05/15/2015  . Hypertriglyceridemia 03/13/2014  . Screening for lipoid disorders 03/05/2014  . GERD (gastroesophageal reflux disease) 09/04/2011  . History of kidney stones 03/12/2010  . MICROSCOPIC HEMATURIA 03/12/2010  . [place], PERSISTENT 03/15/2008  . Allergic rhinitis 02/13/2008  . DEPRESSION 04/27/2007   Past Medical History:  Diagnosis Date  . Depression   . Environmental allergies   . GERD (gastroesophageal reflux disease)   . Other and unspecified noninfectious gastroenteritis and colitis(558.9)   . Other malaise and fatigue   . Panic attack   . Renal stones   . Tobacco use disorder   . Unspecified sleep apnea    Past Surgical History:  Procedure Laterality Date  . neg hx     Social History   Tobacco Use  . Smoking status: Current Every Day Smoker    Packs/day: 0.50    Years: 15.00    Pack years: 7.50    Types: Cigarettes  . Smokeless tobacco:  Former Systems developer    Types: Snuff    Quit date: 10/20/2011  . Tobacco comment: form given 12-09-15  Substance Use Topics  . Alcohol use: Yes    Alcohol/week: 0.0 standard drinks    Comment: occasionally  . Drug use: No   Family History  Problem Relation Age of Onset  . Asthma Mother   . Hypertension Father   . Heart disease Paternal Grandmother   . Diabetes Paternal Grandmother   . Lactose intolerance Daughter   . Bone cancer Paternal Grandfather   . Colon cancer Neg Hx   . Esophageal cancer Neg Hx   . Pancreatic cancer Neg Hx   . Stomach cancer Neg Hx   . Kidney disease Neg Hx   . Liver disease Neg Hx    Allergies  Allergen Reactions  . Chantix [Varenicline] Shortness Of Breath  . Betadine [Povidone Iodine] Rash   Current Outpatient Medications on File Prior to Visit  Medication Sig Dispense Refill  . fexofenadine (ALLEGRA) 180 MG tablet Take 180 mg by mouth daily.    . fluticasone (FLONASE) 50 MCG/ACT nasal spray Place 1 spray into both nostrils daily. 16 g 0  . glycopyrrolate (ROBINUL) 2 MG tablet Take 1 tablet (2 mg total) by mouth  2 (two) times daily. 180 tablet 3  . hyoscyamine (LEVSIN SL) 0.125 MG SL tablet Take 1-2 tablets by mouth before meals (Patient taking differently: Take 0.125-0.25 mg by mouth See admin instructions. "THREE TIMES A DAY BEFORE MEALS AS NEEDED FOR ABDOMINAL CRAMPING") 30 tablet 2  . ibuprofen (ADVIL,MOTRIN) 200 MG tablet Take 800 mg by mouth every 6 (six) hours as needed (for headaches).    . Multiple Vitamins-Minerals (MULTI ADULT GUMMIES) CHEW Chew 2 tablets by mouth every evening.    . pantoprazole (PROTONIX) 40 MG tablet Take 1 tablet (40 mg total) by mouth 2 (two) times daily. 180 tablet 3   No current facility-administered medications on file prior to visit.     Review of Systems  Constitutional: Positive for appetite change and fatigue. Negative for activity change, fever and unexpected weight change.  HENT: Negative for congestion, rhinorrhea,  sore throat and trouble swallowing.   Eyes: Negative for pain, redness, itching and visual disturbance.  Respiratory: Negative for cough, chest tightness, shortness of breath and wheezing.   Cardiovascular: Negative for chest pain and palpitations.  Gastrointestinal: Positive for abdominal pain and nausea. Negative for abdominal distention, anal bleeding, blood in stool, constipation, diarrhea, rectal pain and vomiting.  Endocrine: Negative for cold intolerance, heat intolerance, polydipsia and polyuria.  Genitourinary: Negative for difficulty urinating, dysuria, frequency and urgency.  Musculoskeletal: Negative for arthralgias, joint swelling and myalgias.  Skin: Negative for pallor and rash.  Neurological: Negative for dizziness, tremors, weakness, numbness and headaches.  Hematological: Negative for adenopathy. Does not bruise/bleed easily.  Psychiatric/Behavioral: Negative for decreased concentration and dysphoric mood. The patient is not nervous/anxious.        Objective:   Physical Exam  Constitutional: He appears well-developed and well-nourished. No distress.  Well but fatigued appearing   HENT:  Head: Normocephalic and atraumatic.  Mouth/Throat: Oropharynx is clear and moist. No oropharyngeal exudate.  Eyes: Pupils are equal, round, and reactive to light. Conjunctivae and EOM are normal. Right eye exhibits no discharge. Left eye exhibits no discharge. No scleral icterus.  Neck: Normal range of motion. Neck supple.  Cardiovascular: Normal rate, regular rhythm and normal heart sounds.  Pulmonary/Chest: Effort normal and breath sounds normal. No stridor. No respiratory distress. He has no wheezes. He has no rales.  Harsh bs No wheezing   Abdominal: Soft. Bowel sounds are normal. He exhibits no distension, no pulsatile liver, no abdominal bruit, no pulsatile midline mass and no mass. There is no hepatosplenomegaly. There is generalized tenderness and tenderness in the epigastric  area. There is no rigidity, no rebound, no guarding, no CVA tenderness, no tenderness at McBurney's point and negative Murphy's sign.  Mild diffuse tenderness, more tender in epigastrium No rebound/guarding  No distension or rigidity Nl bs  4 Q  Musculoskeletal: He exhibits no edema.  Lymphadenopathy:    He has no cervical adenopathy.  Neurological: He is alert. Coordination normal.  Skin: Skin is warm and dry. No erythema. No pallor.  No jaundice   Psychiatric: He exhibits a depressed mood.  Pt seems generally stressed and tired  He voices being down           Assessment & Plan:   Problem List Items Addressed This Visit      Other   Abdominal pain - Primary    Worst in epigastric area (rad to back) in pt with past hx of gastritis  No stool changes  Takes protonix bid No current nsaids  Labs now/cbc /lipase/cmet  carafate px (helpes in the past) tid with meals-disc how to take Consider imaging depending on lab results and response to carafate       Relevant Orders   CBC with Differential/Platelet (Completed)   Comprehensive metabolic panel (Completed)   Lipase (Completed)   Smoking    Disc in detail risks of smoking and possible outcomes including copd, vascular/ heart disease, cancer , respiratory and sinus infections  Pt voices understanding

## 2018-06-03 ENCOUNTER — Telehealth: Payer: Self-pay

## 2018-06-03 ENCOUNTER — Ambulatory Visit
Admission: RE | Admit: 2018-06-03 | Discharge: 2018-06-03 | Disposition: A | Discharge status: Home / Self Care | Payer: BLUE CROSS/BLUE SHIELD | Source: Ambulatory Visit | Attending: Family Medicine | Admitting: Family Medicine

## 2018-06-03 ENCOUNTER — Telehealth: Payer: Self-pay | Admitting: Family Medicine

## 2018-06-03 DIAGNOSIS — R109 Unspecified abdominal pain: Secondary | ICD-10-CM | POA: Diagnosis not present

## 2018-06-03 DIAGNOSIS — K2900 Acute gastritis without bleeding: Secondary | ICD-10-CM

## 2018-06-03 DIAGNOSIS — R1084 Generalized abdominal pain: Secondary | ICD-10-CM | POA: Diagnosis not present

## 2018-06-03 MED ORDER — IOPAMIDOL (ISOVUE-300) INJECTION 61%
100.0000 mL | Freq: Once | INTRAVENOUS | Status: AC | PRN
  Administered 2018-06-03: 100 mL via INTRAVENOUS

## 2018-06-03 NOTE — Telephone Encounter (Signed)
Stat CT scan set up for today at 3pm at the [facility] in [place]. Call report requested.

## 2018-06-03 NOTE — Telephone Encounter (Signed)
CT of abd/pelvis called report; report is in Epic and I am taking a copy to Dr Glori Bickers pt is waiting.

## 2018-06-03 NOTE — Telephone Encounter (Signed)
Ref done for GI  Will route to [facility] Please call him  Thanks

## 2018-06-03 NOTE — Telephone Encounter (Signed)
Great- I will look out for results

## 2018-06-03 NOTE — Telephone Encounter (Signed)
Pt aware/thx dmf 

## 2018-06-03 NOTE — Telephone Encounter (Signed)
I did a stat ref for CT and will route to [facility] Please call patient

## 2018-06-03 NOTE — Telephone Encounter (Signed)
I reviewed it -he is on the way home from his CT right now  Everything looked normal- this ruled out problems with liver/gallbladder/colon / etc  I think his pain must be coming from gastritis / too much stomach acid  Do keep trying the carafate and eat a bland diet Continue his other GI medicines I am going to refer him to GI for this - please ask if he prefers [place] or [place] and I will get on that task

## 2018-06-03 NOTE — Telephone Encounter (Signed)
-----   Message from Modena Nunnery, [place] sent at 06/03/2018  8:10 AM EDT ----- Spoke to pt who states his pain did not increase and he did not go to the ED. He states he is agreeable to CT and was advised to await a call with appt details

## 2018-06-06 NOTE — Telephone Encounter (Signed)
Appt made with Wallace Going P.A. On 06/23/18 at 2pm. Patient notified

## 2018-06-23 ENCOUNTER — Ambulatory Visit (INDEPENDENT_AMBULATORY_CARE_PROVIDER_SITE_OTHER): Payer: BLUE CROSS/BLUE SHIELD | Admitting: Nurse Practitioner

## 2018-06-23 ENCOUNTER — Encounter: Payer: Self-pay | Admitting: Nurse Practitioner

## 2018-06-23 VITALS — BP 118/78 | HR 70 | Ht 68.5 in | Wt 175.0 lb

## 2018-06-23 DIAGNOSIS — R194 Change in bowel habit: Secondary | ICD-10-CM

## 2018-06-23 DIAGNOSIS — R6881 Early satiety: Secondary | ICD-10-CM

## 2018-06-23 DIAGNOSIS — R1084 Generalized abdominal pain: Secondary | ICD-10-CM

## 2018-06-23 DIAGNOSIS — R634 Abnormal weight loss: Secondary | ICD-10-CM

## 2018-06-23 MED ORDER — GLYCOPYRROLATE 2 MG PO TABS: 2.0000 mg | ORAL_TABLET | Freq: Two times a day (BID) | ORAL | 3 refills | Status: DC

## 2018-06-23 MED ORDER — NA SULFATE-K SULFATE-MG SULF 17.5-3.13-1.6 GM/177ML PO SOLN: ORAL | 0 refills | Status: DC

## 2018-06-23 NOTE — Progress Notes (Signed)
GI Provider:  Primary GI:  Lucio Edward, MD   Chief Complaint:    Abdominal pain, nausea, weight loss, bowel changes  IMPRESSION and PLAN:    #19. 32 year old male here with recent worsening of chronic abdominal pain. Also having intermittent nausea and vomiting of undigested foods. No NSAIDs use.  Labs, CT scan unremarkable. On chronic PPI.Marland KitchenSymptoms may be functional but he has documented unintentional weight loss 10 pounds since Feb warranted further evaluation.  -Will arrange for EGD. Patient thought he was getting one done today and is upset that this was miscommunicated to him. The risks and benefits of EGD were discussed and the patient agrees to proceed.  -resume Robinul for abdominal pain, Rx sent to pharmacy   2. Bowel changes unexplained by meds, dietary changes. Low suspicion for IBD or malignancy.  -For further evaluation patient will be scheduled for colonoscopy to be done at time of EGD. The risks and benefits of EGD were discussed and the patient agrees to proceed.      HPI:    Patient is a 32 year old male with history of IBS / lactose intolerance and GERD. Hie gives a a 10 year hx of abdominal pain , altered bowel habits. Saw PCP mid Aug for acute worsening of chronic abdominal pain, nausea and chills. Symptoms caused him to miss work for a week.  Pain is constant, not worse or better with food. Belching provides minor relief. Sometimes having a BM helps but not on a consistent basis.  Describes the pain as achy, burning. diffuse, wraps around both sides. Sometimes vomits undigested food in the am. He complains of early satiety.  Labs in mid Aug including serum chemistries and CBC were normal.  CT scan unremarkable. Prescribed Carafate before meals but eats only once daily so taking it once daily. He has taken Prilosec BID for years. Weight was 185 pounds in early Feb, down to 175 today.  Donald Merritt complains of recent bowel changes. He may skip a few days and stool consistency  varies between liquid and soft, never hard. No blood in stools. No fevers.  Per our note from 2017 pain improved with lactose avoidance, Robinul and PPI. He can't remember taking the Robinul. He doesn't follow a lactose free diet anymore but consumes very limited amount.    He admits to some mild burning with urination. No excessive urination unless drinks a lot of water. No hematuria   Review of systems:     No chest pain, no SOB, no fevers, no joint aches.   Past Medical History:  Diagnosis Date  . Depression   . Environmental allergies   . GERD (gastroesophageal reflux disease)   . Other and unspecified noninfectious gastroenteritis and colitis(558.9)   . Other malaise and fatigue   . Panic attack   . Renal stones   . Tobacco use disorder   . Unspecified sleep apnea     Patient's surgical history, family medical history, social history, medications and allergies were all reviewed in Epic   Creatinine clearance cannot be calculated (Patient's most recent lab result is older than the maximum 21 days allowed.)  Current Outpatient Medications  Medication Sig Dispense Refill  . fexofenadine (ALLEGRA) 180 MG tablet Take 180 mg by mouth daily.    . fluticasone (FLONASE) 50 MCG/ACT nasal spray Place 1 spray into both nostrils daily. 16 g 0  . glycopyrrolate (ROBINUL) 2 MG tablet Take 1 tablet (2 mg total) by mouth 2 (two)  times daily. 180 tablet 3  . hyoscyamine (LEVSIN SL) 0.125 MG SL tablet Take 1-2 tablets by mouth before meals (Patient taking differently: Take 0.125-0.25 mg by mouth See admin instructions. "THREE TIMES A DAY BEFORE MEALS AS NEEDED FOR ABDOMINAL CRAMPING") 30 tablet 2  . ibuprofen (ADVIL,MOTRIN) 200 MG tablet Take 800 mg by mouth every 6 (six) hours as needed (for headaches).    . Multiple Vitamins-Minerals (MULTI ADULT GUMMIES) CHEW Chew 2 tablets by mouth every evening.    . pantoprazole (PROTONIX) 40 MG tablet Take 1 tablet (40 mg total) by mouth 2 (two) times  daily. 180 tablet 3  . sucralfate (CARAFATE) 1 g tablet Take 1 tablet (1 g total) by mouth 3 (three) times daily as needed (before meals with water). 30 tablet 0   No current facility-administered medications for this visit.     Physical Exam:     BP 118/78   Pulse 70   Ht 5' 8.5" (1.74 m) Comment: measured without shoes  Wt 175 lb (79.4 kg)   BMI 26.22 kg/m   GENERAL:  Well developed male in NAD PSYCH: : Cooperative, normal affect EENT:  conjunctiva pink, mucous membranes moist, neck supple without masses CARDIAC:  RRR, no murmur heard, no peripheral edema PULM: Normal respiratory effort, lungs CTA bilaterally, no wheezing ABDOMEN:  Soft, nondistended, diffusely tender even to mild palpation. No obvious masses, no hepatomegaly,  normal bowel sounds SKIN:  turgor, no lesions seen Musculoskeletal:  Normal muscle tone, normal strength NEURO: Alert and oriented x 3, no focal neurologic deficits   Tye Savoy , NP 06/23/2018, 2:38 PM   Cc: Loura Pardon, MD

## 2018-06-23 NOTE — Patient Instructions (Signed)
If you are age 33 or older, your body mass index should be between 23-30. Your Body mass index is 26.22 kg/m. If this is out of the aforementioned range listed, please consider follow up with your Primary Care Provider.  If you are age 63 or younger, your body mass index should be between 19-25. Your Body mass index is 26.22 kg/m. If this is out of the aformentioned range listed, please consider follow up with your Primary Care Provider.   You have been scheduled for an endoscopy and colonoscopy. Please follow the written instructions given to you at your visit today. Please pick up your prep supplies at the pharmacy within the next 1-3 days. If you use inhalers (even only as needed), please bring them with you on the day of your procedure. Your physician has requested that you go to www.startemmi.com and enter the access code given to you at your visit today. This web site gives a general overview about your procedure. However, you should still follow specific instructions given to you by our office regarding your preparation for the procedure.  We have sent the following medications to your pharmacy for you to pick up at your convenience: Suprep Robinul  Thank you for choosing me and [facility].   Tye Savoy, NP

## 2018-06-24 ENCOUNTER — Encounter: Payer: Self-pay | Admitting: Nurse Practitioner

## 2018-06-24 NOTE — Progress Notes (Signed)
Reviewed and agree with management plan.  Chung Chagoya T. Jujuan Dugo, MD FACG 

## 2018-07-11 ENCOUNTER — Telehealth: Payer: Self-pay | Admitting: Family Medicine

## 2018-07-11 MED ORDER — PANTOPRAZOLE SODIUM 40 MG PO TBEC: 40.0000 mg | DELAYED_RELEASE_TABLET | Freq: Two times a day (BID) | ORAL | 1 refills | Status: DC

## 2018-07-11 NOTE — Telephone Encounter (Signed)
Copied from [place]. Topic: Quick Communication - Rx Refill/Question >> Jul 11, 2018 11:41 AM Alfredia Ferguson R wrote: Medication: pantoprazole (PROTONIX) 40 MG tablet  Has the patient contacted their pharmacy?Yes  Preferred Pharmacy (with phone number or street name): [id number] [place], [place] [phone] (Phone) [phone] (Fax)

## 2018-07-11 NOTE — Telephone Encounter (Signed)
Protonix 40mg  refill Last Refill:01/04/17 # 180 Last OV: 06/02/18 [facility]CP: [facility] [phone]

## 2018-07-11 NOTE — Addendum Note (Signed)
Addended by: Tammi Sou on: 07/11/2018 03:11 PM   Modules accepted: Orders

## 2018-07-15 ENCOUNTER — Encounter: Payer: Self-pay | Admitting: Gastroenterology

## 2018-07-26 ENCOUNTER — Telehealth: Payer: Self-pay | Admitting: Gastroenterology

## 2018-07-26 NOTE — Telephone Encounter (Signed)
Pt stated that he woke up with a chest cold this morning, he is scheduled for endo colon on 07/29/18, wants to know if he should r/s.

## 2018-07-26 NOTE — Telephone Encounter (Signed)
Patient notified okay to keep the procedure as scheduled unless he has fever or an uncontrolled cough

## 2018-07-29 ENCOUNTER — Encounter: Payer: Self-pay | Admitting: Gastroenterology

## 2018-07-29 ENCOUNTER — Ambulatory Visit (AMBULATORY_SURGERY_CENTER): Payer: BLUE CROSS/BLUE SHIELD | Admitting: Gastroenterology

## 2018-07-29 VITALS — BP 110/71 | HR 95 | Temp 99.1°F | Resp 17 | Ht 68.0 in | Wt 175.0 lb

## 2018-07-29 DIAGNOSIS — R6881 Early satiety: Secondary | ICD-10-CM

## 2018-07-29 DIAGNOSIS — R634 Abnormal weight loss: Secondary | ICD-10-CM

## 2018-07-29 DIAGNOSIS — R194 Change in bowel habit: Secondary | ICD-10-CM | POA: Diagnosis not present

## 2018-07-29 DIAGNOSIS — D120 Benign neoplasm of cecum: Secondary | ICD-10-CM

## 2018-07-29 DIAGNOSIS — K219 Gastro-esophageal reflux disease without esophagitis: Secondary | ICD-10-CM

## 2018-07-29 DIAGNOSIS — K635 Polyp of colon: Secondary | ICD-10-CM | POA: Diagnosis not present

## 2018-07-29 DIAGNOSIS — R1084 Generalized abdominal pain: Secondary | ICD-10-CM | POA: Diagnosis not present

## 2018-07-29 MED ORDER — SODIUM CHLORIDE 0.9 % IV SOLN: 500.0000 mL | Freq: Once | INTRAVENOUS | Status: DC

## 2018-07-29 NOTE — Patient Instructions (Signed)
YOU HAD AN ENDOSCOPIC PROCEDURE TODAY AT [facility]:   Refer to the procedure report that was given to you for any specific questions about what was found during the examination.  If the procedure report does not answer your questions, please call your gastroenterologist to clarify.  If you requested that your care partner not be given the details of your procedure findings, then the procedure report has been included in a sealed envelope for you to review at your convenience later.  YOU SHOULD EXPECT: Some feelings of bloating in the abdomen. Passage of more gas than usual.  Walking can help get rid of the air that was put into your GI tract during the procedure and reduce the bloating. If you had a lower endoscopy (such as a colonoscopy or flexible sigmoidoscopy) you may notice spotting of blood in your stool or on the toilet paper. If you underwent a bowel prep for your procedure, you may not have a normal bowel movement for a few days.  Please Note:  You might notice some irritation and congestion in your nose or some drainage.  This is from the oxygen used during your procedure.  There is no need for concern and it should clear up in a day or so.  SYMPTOMS TO REPORT IMMEDIATELY:   Following lower endoscopy (colonoscopy or flexible sigmoidoscopy):  Excessive amounts of blood in the stool  Significant tenderness or worsening of abdominal pains  Swelling of the abdomen that is new, acute  Fever of 100F or higher   Following upper endoscopy (EGD)  Vomiting of blood or coffee ground material  New chest pain or pain under the shoulder blades  Painful or persistently difficult swallowing  New shortness of breath  Fever of 100F or higher  Black, tarry-looking stools  For urgent or emergent issues, a gastroenterologist can be reached at any hour by calling [phone].   DIET:  We do recommend a small meal at first, but then you may proceed to your regular diet.  Drink  plenty of fluids but you should avoid alcoholic beverages for 24 hours.  ACTIVITY:  You should plan to take it easy for the rest of today and you should NOT DRIVE or use heavy machinery until tomorrow (because of the sedation medicines used during the test).    FOLLOW UP: Our staff will call the number listed on your records the next business day following your procedure to check on you and address any questions or concerns that you may have regarding the information given to you following your procedure. If we do not reach you, we will leave a message.  However, if you are feeling well and you are not experiencing any problems, there is no need to return our call.  We will assume that you have returned to your regular daily activities without incident.  If any biopsies were taken you will be contacted by phone or by letter within the next 1-3 weeks.  Please call us at [phone] if you have not heard about the biopsies in 3 weeks.    SIGNATURES/CONFIDENTIALITY: You and/or your care partner have signed paperwork which will be entered into your electronic medical record.  These signatures attest to the fact that that the information above on your After Visit Summary has been reviewed and is understood.  Full responsibility of the confidentiality of this discharge information lies with you and/or your care-partner.   IBgard 1-2 three times daily as needed.  Next colonoscopy  at 32 years old.

## 2018-07-29 NOTE — Progress Notes (Signed)
Called to room to assist during endoscopic procedure.  Patient ID and intended procedure confirmed with present staff. Received instructions for my participation in the procedure from the performing physician.  

## 2018-07-29 NOTE — Progress Notes (Signed)
Report given to PACU, vss 

## 2018-07-29 NOTE — Op Note (Signed)
[place] Patient Name: Donald Merritt Procedure Date: 07/29/2018 2:46 PM MRN: [id number] Endoscopist: Ladene Artist , MD Age: 32 Referring MD:  Date of Birth: 01-05-1986 Gender: Male Account #: [id number] Procedure:                Upper GI endoscopy Indications:              Generalized abdominal pain, Gastroesophageal reflux                            disease, Weight loss, Early satiety Medicines:                Monitored Anesthesia Care Procedure:                Pre-Anesthesia Assessment:                           - Prior to the procedure, a History and Physical                            was performed, and patient medications and                            allergies were reviewed. The patient's tolerance of                            previous anesthesia was also reviewed. The risks                            and benefits of the procedure and the sedation                            options and risks were discussed with the patient.                            All questions were answered, and informed consent                            was obtained. Prior Anticoagulants: The patient has                            taken no previous anticoagulant or antiplatelet                            agents. ASA Grade Assessment: II - A patient with                            mild systemic disease. After reviewing the risks                            and benefits, the patient was deemed in                            satisfactory condition to undergo the procedure.  After obtaining informed consent, the endoscope was                            passed under direct vision. Throughout the                            procedure, the patient's blood pressure, pulse, and                            oxygen saturations were monitored continuously. The                            Model GIF-HQ190 [id number]) scope was introduced                            through the  mouth, and advanced to the second part                            of duodenum. The upper GI endoscopy was                            accomplished without difficulty. The patient                            tolerated the procedure well. Scope In: Scope Out: Findings:                 The examined esophagus was normal.                           Patchy mildly erythematous mucosa without bleeding                            was found in the gastric body and in the gastric                            antrum. Biopsies were taken with a cold forceps for                            histology.                           The exam of the stomach was otherwise normal.                           The duodenal bulb and second portion of the                            duodenum were normal. Complications:            No immediate complications. Estimated Blood Loss:     Estimated blood loss was minimal. Impression:               - Normal esophagus.                           -  Erythematous mucosa in the gastric body and                            antrum. Biopsied.                           - Normal duodenal bulb and second portion of the                            duodenum. Recommendation:           - Patient has a contact number available for                            emergencies. The signs and symptoms of potential                            delayed complications were discussed with the                            patient. Return to normal activities tomorrow.                            Written discharge instructions were provided to the                            patient.                           - Resume previous diet.                           - Antireflux measures.                           - Continue present medications.                           - Await pathology results.                           - FDgard 1-2 po tid prn Ladene Artist, MD 07/29/2018 3:25:08 PM This report has been signed  electronically.

## 2018-07-29 NOTE — Op Note (Signed)
[place] Patient Name: Donald Merritt Procedure Date: 07/29/2018 2:46 PM MRN: [id number] Endoscopist: Ladene Artist , MD Age: 32 Referring MD:  Date of Birth: 1986-05-30 Gender: Male Account #: [id number] Procedure:                Colonoscopy Indications:              Generalized abdominal pain, Change in bowel habits,                            Weight loss Medicines:                Monitored Anesthesia Care Procedure:                Pre-Anesthesia Assessment:                           - Prior to the procedure, a History and Physical                            was performed, and patient medications and                            allergies were reviewed. The patient's tolerance of                            previous anesthesia was also reviewed. The risks                            and benefits of the procedure and the sedation                            options and risks were discussed with the patient.                            All questions were answered, and informed consent                            was obtained. Prior Anticoagulants: The patient has                            taken no previous anticoagulant or antiplatelet                            agents. ASA Grade Assessment: II - A patient with                            mild systemic disease. After reviewing the risks                            and benefits, the patient was deemed in                            satisfactory condition to undergo the procedure.  After obtaining informed consent, the colonoscope                            was passed under direct vision. Throughout the                            procedure, the patient's blood pressure, pulse, and                            oxygen saturations were monitored continuously. The                            Model CF-[id number]) scope was introduced                            through the anus and advanced to the the  terminal                            ileum, with identification of the appendiceal                            orifice and IC valve. The terminal ileum, ileocecal                            valve, appendiceal orifice, and rectum were                            photographed. The quality of the bowel preparation                            was good after extensive lavage and suctioning. The                            colonoscopy was performed without difficulty. The                            patient tolerated the procedure well. Scope In: 2:54:03 PM Scope Out: 3:09:55 PM Scope Withdrawal Time: 0 hours 12 minutes 55 seconds  Total Procedure Duration: 0 hours 15 minutes 52 seconds  Findings:                 The perianal and digital rectal examinations were                            normal.                           A 6 mm polyp was found in the cecum. The polyp was                            sessile. The polyp was removed with a cold snare.                            Resection and retrieval were complete.  The terminal ileum appeared normal.                           The exam was otherwise without abnormality on                            direct and retroflexion views. Complications:            No immediate complications. Estimated blood loss:                            None. Estimated Blood Loss:     Estimated blood loss: none. Impression:               - One 6 mm polyp in the cecum, removed with a cold                            snare. Resected and retrieved.                           - The examined portion of the ileum was normal.                           - The examination was otherwise normal on direct                            and retroflexion views. Recommendation:           - Repeat colonoscopy in 5 years for surveillance if                            polyp is precancerous, otherwise routine CRC                            screening at age 45.                            - Patient has a contact number available for                            emergencies. The signs and symptoms of potential                            delayed complications were discussed with the                            patient. Return to normal activities tomorrow.                            Written discharge instructions were provided to the                            patient.                           - Resume previous diet.                           -  Continue present medications.                           - Await pathology results.                           - IBgard 1-2 po tid prn Ladene Artist, MD 07/29/2018 3:13:27 PM This report has been signed electronically.

## 2018-08-01 ENCOUNTER — Telehealth: Payer: Self-pay

## 2018-08-01 NOTE — Telephone Encounter (Signed)
  Follow up Call-  Call back number 07/29/2018  Post procedure Call Back phone  # [phone]  Permission to leave phone message Yes  Some recent data might be hidden     Patient questions:  Do you have a fever, pain , or abdominal swelling? No. Pain Score  0 *  Have you tolerated food without any problems? Yes.    Have you been able to return to your normal activities? Yes.    Do you have any questions about your discharge instructions: Diet   No. Medications  No. Follow up visit  No.  Do you have questions or concerns about your Care? No.  Actions: * If pain score is 4 or above: No action needed, pain <4.

## 2018-08-15 ENCOUNTER — Encounter: Payer: Self-pay | Admitting: Gastroenterology

## 2018-09-14 ENCOUNTER — Other Ambulatory Visit: Payer: Self-pay | Admitting: Nurse Practitioner

## 2019-02-14 IMAGING — DX DG CHEST 2V
2 series · 2 of 2 positions shown · non-contrast
Comparison: Radiographs September 02, 2011.

CLINICAL DATA: Chest pain.

EXAM:
CHEST  2 VIEW

[w chest pa]
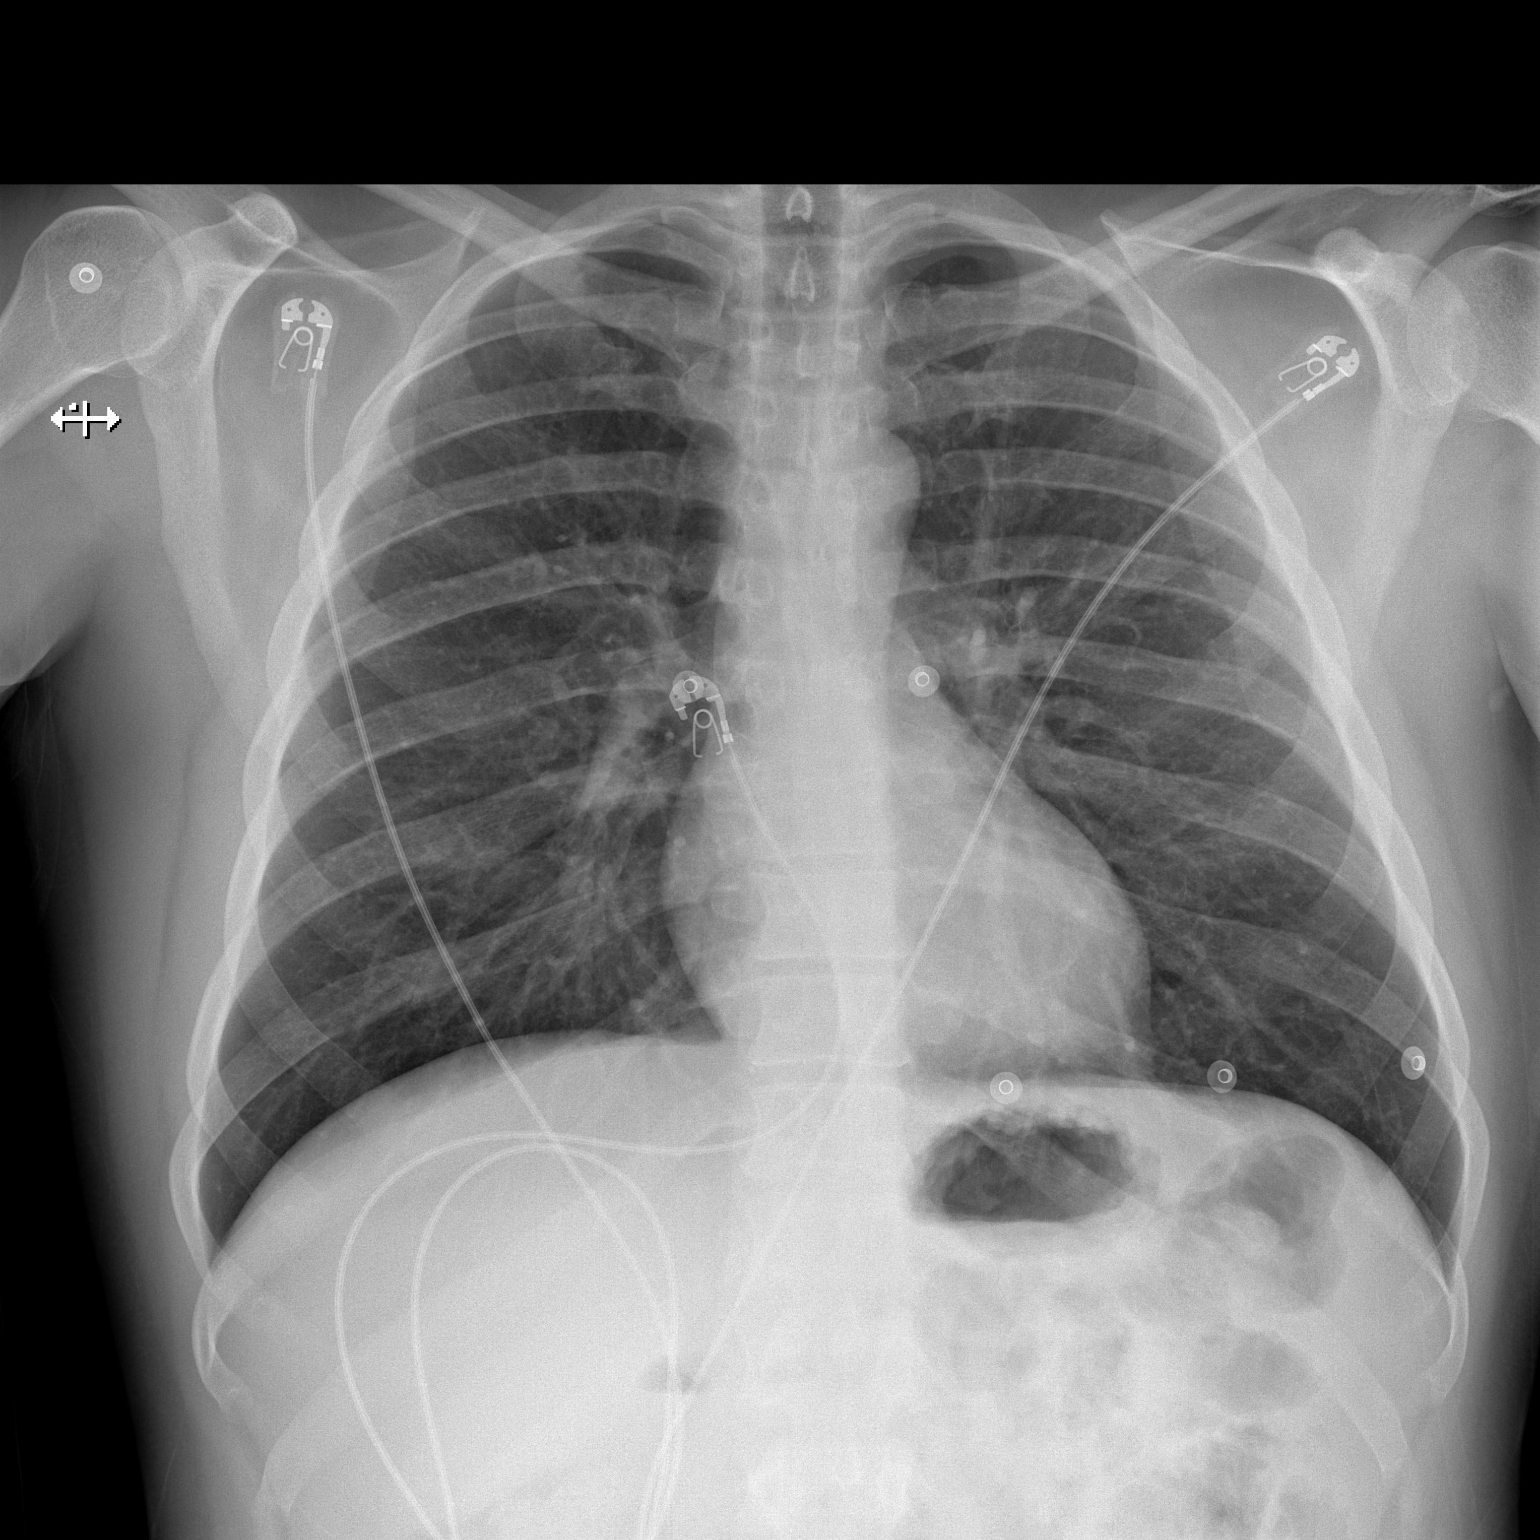

[w chest lat]
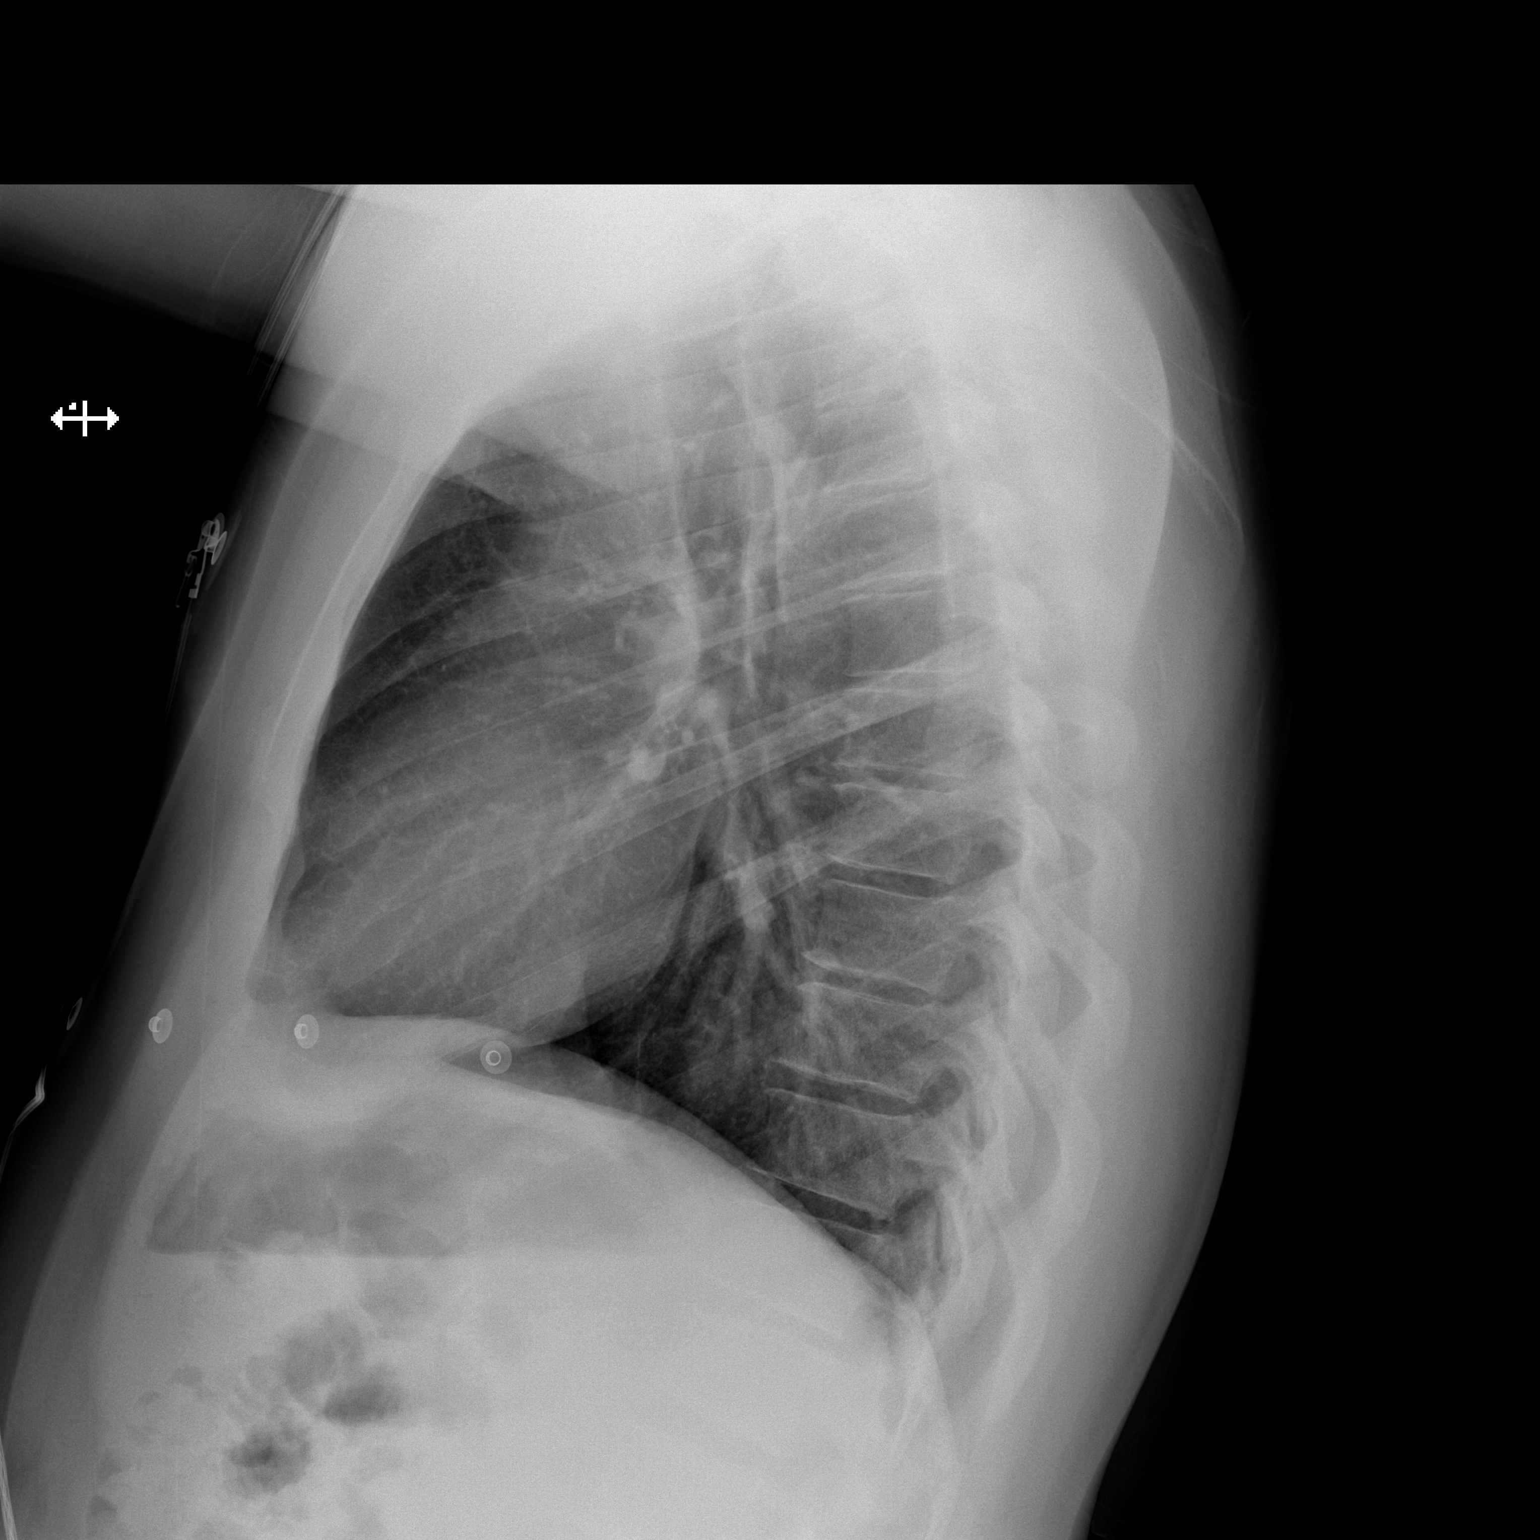

[2 of 2 positions shown; findings below may reference images not displayed]

FINDINGS: The heart size and mediastinal contours are within normal limits.
Both lungs are clear. No pneumothorax or pleural effusion is noted.
The visualized skeletal structures are unremarkable.
IMPRESSION: No active cardiopulmonary disease.

## 2019-03-06 ENCOUNTER — Encounter (HOSPITAL_COMMUNITY): Payer: Self-pay | Admitting: *Deleted

## 2019-03-06 ENCOUNTER — Emergency Department (HOSPITAL_COMMUNITY): Payer: BLUE CROSS/BLUE SHIELD

## 2019-03-06 ENCOUNTER — Emergency Department (HOSPITAL_COMMUNITY)
Admission: EM | Admit: 2019-03-06 | Discharge: 2019-03-06 | Disposition: A | Discharge status: Home / Self Care | Payer: BLUE CROSS/BLUE SHIELD | Attending: Emergency Medicine | Admitting: Emergency Medicine

## 2019-03-06 ENCOUNTER — Telehealth: Payer: Self-pay

## 2019-03-06 DIAGNOSIS — F17210 Nicotine dependence, cigarettes, uncomplicated: Secondary | ICD-10-CM | POA: Diagnosis not present

## 2019-03-06 DIAGNOSIS — Z79899 Other long term (current) drug therapy: Secondary | ICD-10-CM | POA: Insufficient documentation

## 2019-03-06 DIAGNOSIS — R109 Unspecified abdominal pain: Secondary | ICD-10-CM | POA: Diagnosis not present

## 2019-03-06 LAB — URINALYSIS, ROUTINE W REFLEX MICROSCOPIC
Bilirubin Urine: NEGATIVE
Glucose, UA: NEGATIVE mg/dL
Hgb urine dipstick: NEGATIVE
Ketones, ur: NEGATIVE mg/dL
Leukocytes,Ua: NEGATIVE
Nitrite: NEGATIVE
Protein, ur: NEGATIVE mg/dL
Specific Gravity, Urine: 1.012 (ref 1.005–1.030)
pH: 5 (ref 5.0–8.0)

## 2019-03-06 LAB — CBC WITH DIFFERENTIAL/PLATELET
Abs Immature Granulocytes: 0.03 10*3/uL (ref 0.00–0.07)
Basophils Absolute: 0.1 10*3/uL (ref 0.0–0.1)
Basophils Relative: 1 %
Eosinophils Absolute: 0.6 10*3/uL — ABNORMAL HIGH (ref 0.0–0.5)
Eosinophils Relative: 6 %
HCT: 46.9 % (ref 39.0–52.0)
Hemoglobin: 16.3 g/dL (ref 13.0–17.0)
Immature Granulocytes: 0 %
Lymphocytes Relative: 27 %
Lymphs Abs: 2.7 10*3/uL (ref 0.7–4.0)
MCH: 30 pg (ref 26.0–34.0)
MCHC: 34.8 g/dL (ref 30.0–36.0)
MCV: 86.2 fL (ref 80.0–100.0)
Monocytes Absolute: 0.5 10*3/uL (ref 0.1–1.0)
Monocytes Relative: 5 %
Neutro Abs: 6 10*3/uL (ref 1.7–7.7)
Neutrophils Relative %: 61 %
Platelets: 186 10*3/uL (ref 150–400)
RBC: 5.44 MIL/uL (ref 4.22–5.81)
RDW: 13 % (ref 11.5–15.5)
WBC: 9.8 10*3/uL (ref 4.0–10.5)
nRBC: 0 % (ref 0.0–0.2)

## 2019-03-06 LAB — BASIC METABOLIC PANEL
Anion gap: 11 (ref 5–15)
BUN: 12 mg/dL (ref 6–20)
CO2: 20 mmol/L — ABNORMAL LOW (ref 22–32)
Calcium: 9.2 mg/dL (ref 8.9–10.3)
Chloride: 106 mmol/L (ref 98–111)
Creatinine, Ser: 0.96 mg/dL (ref 0.61–1.24)
GFR calc Af Amer: >60 mL/min (ref >60)
GFR calc non Af Amer: >60 mL/min (ref >60)
Glucose, Bld: 105 mg/dL — ABNORMAL HIGH (ref 70–99)
Potassium: 4.1 mmol/L (ref 3.5–5.1)
Sodium: 137 mmol/L (ref 135–145)

## 2019-03-06 MED ORDER — KETOROLAC TROMETHAMINE 30 MG/ML IJ SOLN
30.0000 mg | Freq: Once | INTRAMUSCULAR | Status: AC
  Administered 2019-03-06: 30 mg via INTRAMUSCULAR
  Filled 2019-03-06: qty 1

## 2019-03-06 NOTE — ED Notes (Signed)
Pt to CT

## 2019-03-06 NOTE — ED Provider Notes (Signed)
[facility] EMERGENCY DEPARTMENT Provider Note   CSN: [id number] Arrival date & time: 03/06/19  5361    History   Chief Complaint Chief Complaint  Patient presents with   Testicle Pain    HPI Donald Merritt is a 33 y.o. male.     HPI    33 year old male presents today with complaints of right-sided groin pain.  Patient notes over the last week he has had pain from his right flank progressed down into his right groin.  He notes this is sharp in nature, he notes pain 7 out of 10.  He notes he is able to urinate denies any blood.  Denies any abdominal pain or tenderness.  He denies any fever nausea or vomiting.  He notes this feels similar to previous kidney stone but has not had one in approximately 10 years.  Patient is concerned that the pain has stopped moving and has persisted.  Patient denies any pain in his testicles, no pain in his penis.  He does note some discomfort at the tip of his penis with urination.  Medications prior to arrival.  He notes he is only sexually active with his wife.      Past Medical History:  Diagnosis Date   Allergy    Depression    Environmental allergies    GERD (gastroesophageal reflux disease)    Other and unspecified noninfectious gastroenteritis and colitis(558.9)    Other malaise and fatigue    Panic attack    Renal stones    Tobacco use disorder    Unspecified sleep apnea     Patient Active Problem List   Diagnosis Date Noted   Abdominal pain 06/02/2018   IBS (irritable bowel syndrome) 01/04/2017   Smoking 07/22/2015   Gastritis 05/15/2015   Hypertriglyceridemia 03/13/2014   Screening for lipoid disorders 03/05/2014   GERD (gastroesophageal reflux disease) 09/04/2011   History of kidney stones 03/12/2010   MICROSCOPIC HEMATURIA 03/12/2010   HYPERSOMNIA, PERSISTENT 03/15/2008   Allergic rhinitis 02/13/2008   DEPRESSION 04/27/2007    Past Surgical History:  Procedure Laterality Date    neg hx          Home Medications    Prior to Admission medications   Medication Sig Start Date End Date Taking? Authorizing Provider  fexofenadine (ALLEGRA) 180 MG tablet Take 180 mg by mouth daily.   Yes [provider]  fluticasone (FLONASE) 50 MCG/ACT nasal spray Place 1 spray into both nostrils daily. 11/27/17  Yes Caccavale, Sophia, PA-C  hyoscyamine (LEVSIN SL) 0.125 MG SL tablet Take 1-2 tablets by mouth before meals Patient taking differently: Take 0.125-0.25 mg by mouth See admin instructions. "THREE TIMES A DAY BEFORE MEALS AS NEEDED FOR ABDOMINAL CRAMPING" 01/04/17  Yes Tower, Wynelle Fanny, MD  Multiple Vitamins-Minerals (MULTI ADULT GUMMIES) CHEW Chew 2 tablets by mouth every evening.   Yes [provider]  pantoprazole (PROTONIX) 40 MG tablet Take 1 tablet (40 mg total) by mouth 2 (two) times daily. 07/11/18  Yes Tower, Wynelle Fanny, MD  sucralfate (CARAFATE) 1 g tablet Take 1 tablet (1 g total) by mouth 3 (three) times daily as needed (before meals with water). 06/02/18  Yes Tower, Wynelle Fanny, MD  glycopyrrolate (ROBINUL) 2 MG tablet TAKE 1 TABLET (2 MG TOTAL) BY MOUTH 2 (TWO) TIMES DAILY. AS NEEDED FOR ABDOMINAL PAIN Patient not taking: Reported on 03/06/2019 09/14/18   Willia Craze, NP    Family History Family History  Problem Relation Age of  Onset   Asthma Mother    Hypertension Father    Heart disease Paternal Grandmother    Diabetes Paternal Grandmother    Lactose intolerance Daughter    Bone cancer Paternal Grandfather    Colon cancer Neg Hx    Esophageal cancer Neg Hx    Pancreatic cancer Neg Hx    Stomach cancer Neg Hx    Kidney disease Neg Hx    Liver disease Neg Hx     Social History Social History   Tobacco Use   Smoking status: Current Every Day Smoker    Packs/day: 0.50    Years: 15.00    Pack years: 7.50    Types: Cigarettes   Smokeless tobacco: Former Systems developer    Types: Snuff    Quit date: 10/20/2011   Tobacco comment:  form given 12-09-15  Substance Use Topics   Alcohol use: Yes    Alcohol/week: 0.0 standard drinks    Comment: occasionally   Drug use: No     Allergies   Chantix [varenicline] and Betadine [povidone iodine]   Review of Systems Review of Systems  All other systems reviewed and are negative.    Physical Exam Updated Vital Signs BP (!) 159/105 (BP Location: Right Arm)    Pulse 88    Temp 98.2 F (36.8 C) (Oral)    Resp 20    SpO2 100%   Physical Exam Vitals signs and nursing note reviewed.  Constitutional:      Appearance: He is well-developed.  HENT:     Head: Normocephalic and atraumatic.  Eyes:     General: No scleral icterus.       Right eye: No discharge.        Left eye: No discharge.     Conjunctiva/sclera: Conjunctivae normal.     Pupils: Pupils are equal, round, and reactive to light.  Neck:     Musculoskeletal: Normal range of motion.     Vascular: No JVD.     Trachea: No tracheal deviation.  Pulmonary:     Effort: Pulmonary effort is normal.     Breath sounds: No stridor.  Abdominal:     Comments: Abdomen soft nontender, no CVA tenderness  Neurological:     Mental Status: He is alert and oriented to person, place, and time.     Coordination: Coordination normal.  Psychiatric:        Behavior: Behavior normal.        Thought Content: Thought content normal.        Judgment: Judgment normal.      ED Treatments / Results  Labs (all labs ordered are listed, but only abnormal results are displayed) Labs Reviewed  CBC WITH DIFFERENTIAL/PLATELET - Abnormal; Notable for the following components:      Result Value   Eosinophils Absolute 0.6 (*)    All other components within normal limits  BASIC METABOLIC PANEL - Abnormal; Notable for the following components:   CO2 20 (*)    Glucose, Bld 105 (*)    All other components within normal limits  URINALYSIS, ROUTINE W REFLEX MICROSCOPIC    EKG None  Radiology Ct Renal Stone Study  Result Date:  03/06/2019 CLINICAL DATA:  Right flank pain EXAM: CT ABDOMEN AND PELVIS WITHOUT CONTRAST TECHNIQUE: Multidetector CT imaging of the abdomen and pelvis was performed following the standard protocol without oral or IV contrast. COMPARISON:  June 03, 2018 FINDINGS: Lower chest: There is slight atelectatic change in the posterior left base. There  is no lung base edema or consolidation. Hepatobiliary: No focal liver lesions are evident on this noncontrast enhanced study. Gallbladder wall is not appreciably thickened. There is no biliary duct dilatation. Pancreas: There is no pancreatic mass or inflammatory focus. Spleen: No splenic lesions are apparent. Adrenals/Urinary Tract: Adrenals bilaterally appear unremarkable. Kidneys bilaterally show no evident mass or hydronephrosis on either side. There is no appreciable renal or ureteral calculus on either side. The urinary bladder is midline. The urinary bladder wall thickness is felt to be within normal limits for degree of bladder distention. Stomach/Bowel: There are occasional sigmoid diverticula without diverticulitis. There is no appreciable bowel wall or mesenteric thickening. There is no evident bowel obstruction. Terminal ileum appears unremarkable. There is no free air or portal venous air. Vascular/Lymphatic: There is no abdominal aortic aneurysm. No vascular lesions are evident on this noncontrast enhanced study. There is no appreciable adenopathy in the abdomen or pelvis. Reproductive: There are prostatic calculi present. Prostate and seminal vesicles are normal in size and contour. There is no evident pelvic mass. Other: Appendix appears normal. There is no abscess or ascites in the abdomen or pelvis. There is a rather minimal ventral hernia containing only fat. Musculoskeletal: There are no blastic or lytic bone lesions. There is no intramuscular lesion. IMPRESSION: 1. No evident renal or ureteral calculus. No hydronephrosis on either side. There are  prostatic calculi. Urinary bladder wall thickness is felt to be within normal limits for degree of bladder distention. 2. Occasional sigmoid diverticula without diverticulitis. No bowel obstruction. No abscess in the abdomen pelvis. Appendix appears unremarkable. 3.  Minimal ventral hernia containing only fat. Electronically Signed   By: Lowella Grip III M.D.   On: 03/06/2019 11:03    Procedures Procedures (including critical care time)  Medications Ordered in ED Medications  ketorolac (TORADOL) 30 MG/ML injection 30 mg (30 mg Intramuscular Given 03/06/19 1003)     Initial Impression / Assessment and Plan / ED Course  I have reviewed the triage vital signs and the nursing notes.  Pertinent labs & imaging results that were available during my care of the patient were reviewed by me and considered in my medical decision making (see chart for details).         33 year old male presents today with right-sided flank pain.  Question kidney stone.  His CT does not show any kidney stones, urine with no blood or infectious etiology.  Remainder of labs reassuring.  Question recently passed stone.  He has no signs of infectious etiology.  Nursing notes testicular pain he has no testicular pain.  Low suspicion for STD.  Patient will use ibuprofen or Tylenol as needed for discomfort return immediately if develops any new or worsening signs or symptoms.  Verbalized understanding and agreement to today's plan.     Final Clinical Impressions(s) / ED Diagnoses   Final diagnoses:  Flank pain    ED Discharge Orders    None       Francee Gentile 03/06/19 1214    Virgel Manifold, MD 03/06/19 1315

## 2019-03-06 NOTE — ED Triage Notes (Signed)
Pt in c/o testicle pain, pain started in his right flank last week, pain has now moved into his testicles, worse with urination, denies blood in urine, denies penile discharge

## 2019-03-06 NOTE — ED Notes (Signed)
Patient verbalizes understanding of discharge instructions. Opportunity for questioning and answers were provided. Armband removed by staff, pt discharged from ED ambulatory to home.  

## 2019-03-06 NOTE — Discharge Instructions (Addendum)
Please read attached information. If you experience any new or worsening signs or symptoms please return to the emergency room for evaluation. Please follow-up with your primary care provider or specialist as discussed. Please use medication prescribed only as directed and discontinue taking if you have any concerning signs or symptoms.   °

## 2019-03-06 NOTE — Telephone Encounter (Signed)
Pt has hx of kidney stones,probably 10 years since last kidney stone and last seen urologist; started 2 - 3 wks ago pt has lower back pain that moved to right side and last wk pain went to groin and has not moved in 1 wk; today has pain & burning upon urination. Pt is afraid the kidney stone is too large to pass; pt has been drinking more water and fluids. Pain level for last wk to present is 7 - 10. No fever,chills, SOB,cough,muscle pain,loss of taste or smell or H/A. This AM slight diarrhea but pt thinks diarrhea caused by food he ate. No travel and no known exposure to + covid or flu. Pt will go to [facility] ED due to pain level and if needs imaging. FYI to Dr Glori Bickers.

## 2019-03-24 ENCOUNTER — Other Ambulatory Visit: Payer: Self-pay | Admitting: Family Medicine

## 2019-04-10 ENCOUNTER — Encounter (HOSPITAL_COMMUNITY): Payer: Self-pay | Admitting: Emergency Medicine

## 2019-04-10 ENCOUNTER — Other Ambulatory Visit: Payer: Self-pay

## 2019-04-10 ENCOUNTER — Emergency Department (HOSPITAL_COMMUNITY)
Admission: EM | Admit: 2019-04-10 | Discharge: 2019-04-10 | Disposition: A | Discharge status: Home / Self Care | Payer: BC Managed Care – PPO | Attending: Emergency Medicine | Admitting: Emergency Medicine

## 2019-04-10 ENCOUNTER — Emergency Department (HOSPITAL_COMMUNITY): Payer: BC Managed Care – PPO

## 2019-04-10 DIAGNOSIS — F17210 Nicotine dependence, cigarettes, uncomplicated: Secondary | ICD-10-CM | POA: Insufficient documentation

## 2019-04-10 DIAGNOSIS — R0602 Shortness of breath: Secondary | ICD-10-CM | POA: Diagnosis not present

## 2019-04-10 DIAGNOSIS — Z79899 Other long term (current) drug therapy: Secondary | ICD-10-CM | POA: Insufficient documentation

## 2019-04-10 DIAGNOSIS — R0789 Other chest pain: Secondary | ICD-10-CM | POA: Insufficient documentation

## 2019-04-10 DIAGNOSIS — R197 Diarrhea, unspecified: Secondary | ICD-10-CM | POA: Insufficient documentation

## 2019-04-10 DIAGNOSIS — R079 Chest pain, unspecified: Secondary | ICD-10-CM | POA: Diagnosis not present

## 2019-04-10 LAB — BASIC METABOLIC PANEL
Anion gap: 9 (ref 5–15)
BUN: 13 mg/dL (ref 6–20)
CO2: 25 mmol/L (ref 22–32)
Calcium: 9.8 mg/dL (ref 8.9–10.3)
Chloride: 105 mmol/L (ref 98–111)
Creatinine, Ser: 1.04 mg/dL (ref 0.61–1.24)
GFR calc Af Amer: >60 mL/min (ref >60)
GFR calc non Af Amer: >60 mL/min (ref >60)
Glucose, Bld: 100 mg/dL — ABNORMAL HIGH (ref 70–99)
Potassium: 4.8 mmol/L (ref 3.5–5.1)
Sodium: 139 mmol/L (ref 135–145)

## 2019-04-10 LAB — CBC
HCT: 48.8 % (ref 39.0–52.0)
Hemoglobin: 16.9 g/dL (ref 13.0–17.0)
MCH: 29.9 pg (ref 26.0–34.0)
MCHC: 34.6 g/dL (ref 30.0–36.0)
MCV: 86.2 fL (ref 80.0–100.0)
Platelets: 192 10*3/uL (ref 150–400)
RBC: 5.66 MIL/uL (ref 4.22–5.81)
RDW: 12.6 % (ref 11.5–15.5)
WBC: 11.8 10*3/uL — ABNORMAL HIGH (ref 4.0–10.5)
nRBC: 0 % (ref 0.0–0.2)

## 2019-04-10 LAB — TROPONIN I
Troponin I: <0.03 ng/mL (ref <0.03)
Troponin I: <0.03 ng/mL (ref <0.03)

## 2019-04-10 MED ORDER — SODIUM CHLORIDE 0.9% FLUSH: 3.0000 mL | Freq: Once | INTRAVENOUS | Status: DC

## 2019-04-10 MED ORDER — IBUPROFEN 800 MG PO TABS: 800.0000 mg | ORAL_TABLET | Freq: Three times a day (TID) | ORAL | 0 refills | Status: DC | PRN

## 2019-04-10 MED ORDER — KETOROLAC TROMETHAMINE 30 MG/ML IJ SOLN
30.0000 mg | Freq: Once | INTRAMUSCULAR | Status: AC
  Administered 2019-04-10: 14:00:00 30 mg via INTRAVENOUS
  Filled 2019-04-10: qty 1

## 2019-04-10 MED ORDER — TRAMADOL HCL 50 MG PO TABS: 50.0000 mg | ORAL_TABLET | Freq: Four times a day (QID) | ORAL | 0 refills | Status: DC | PRN

## 2019-04-10 NOTE — ED Provider Notes (Signed)
[facility] EMERGENCY DEPARTMENT Provider Note   CSN: [id number] Arrival date & time: 04/10/19  1001     History   Chief Complaint Chief Complaint  Patient presents with  . Chest Pain  . Diarrhea    HPI Donald Merritt is a 33 y.o. male.     HPI Patient presents to the emergency department with left lateral chest pain that is constant since this morning.  The patient states that 6 episodes of diarrhea this morning as well.  Patient states certain movements and palpation and deep breathing make his pain worse.  The patient states he can reproduce the pain by pressing over the lateral chest on the left.  Patient was concerned that he may be having heart related issues and that is what brought him to the emergency department.  Patient states that he does not have any cardiac history.  Patient denies any drug use.  The patient denies  shortness of breath, headache,blurred vision, neck pain, fever, cough, weakness, numbness, dizziness, anorexia, edema, abdominal pain, nausea, vomiting, rash, back pain, dysuria, hematemesis, bloody stool, near syncope, or syncope. Past Medical History:  Diagnosis Date  . Allergy   . Depression   . Environmental allergies   . GERD (gastroesophageal reflux disease)   . Other and unspecified noninfectious gastroenteritis and colitis(558.9)   . Other malaise and fatigue   . Panic attack   . Renal stones   . Tobacco use disorder   . Unspecified sleep apnea     Patient Active Problem List   Diagnosis Date Noted  . Abdominal pain 06/02/2018  . IBS (irritable bowel syndrome) 01/04/2017  . Smoking 07/22/2015  . Gastritis 05/15/2015  . Hypertriglyceridemia 03/13/2014  . Screening for lipoid disorders 03/05/2014  . GERD (gastroesophageal reflux disease) 09/04/2011  . History of kidney stones 03/12/2010  . MICROSCOPIC HEMATURIA 03/12/2010  . [place], PERSISTENT 03/15/2008  . Allergic rhinitis 02/13/2008  . DEPRESSION 04/27/2007     Past Surgical History:  Procedure Laterality Date  . neg hx          Home Medications    Prior to Admission medications   Medication Sig Start Date End Date Taking? Authorizing Provider  glycopyrrolate (ROBINUL) 2 MG tablet TAKE 1 TABLET (2 MG TOTAL) BY MOUTH 2 (TWO) TIMES DAILY. AS NEEDED FOR ABDOMINAL PAIN Patient taking differently: Take 2 mg by mouth 2 (two) times daily.  09/14/18  Yes Willia Craze, NP  loratadine (CLARITIN) 10 MG tablet Take 10 mg by mouth daily. 24 hours   Yes [provider]  pantoprazole (PROTONIX) 40 MG tablet TAKE 1 TABLET BY MOUTH TWICE A DAY Patient taking differently: Take 40 mg by mouth 2 (two) times daily.  03/24/19  Yes Tower, Wynelle Fanny, MD  fluticasone (FLONASE) 50 MCG/ACT nasal spray Place 1 spray into both nostrils daily. Patient not taking: Reported on 04/10/2019 11/27/17   Caccavale, Sophia, PA-C  hyoscyamine (LEVSIN SL) 0.125 MG SL tablet Take 1-2 tablets by mouth before meals Patient not taking: Reported on 04/10/2019 01/04/17   Tower, Wynelle Fanny, MD  sucralfate (CARAFATE) 1 g tablet Take 1 tablet (1 g total) by mouth 3 (three) times daily as needed (before meals with water). Patient not taking: Reported on 04/10/2019 06/02/18   Abner Greenspan, MD    Family History Family History  Problem Relation Age of Onset  . Asthma Mother   . Hypertension Father   . Heart disease Paternal Grandmother   . Diabetes Paternal  Grandmother   . Lactose intolerance Daughter   . Bone cancer Paternal Grandfather   . Colon cancer Neg Hx   . Esophageal cancer Neg Hx   . Pancreatic cancer Neg Hx   . Stomach cancer Neg Hx   . Kidney disease Neg Hx   . Liver disease Neg Hx     Social History Social History   Tobacco Use  . Smoking status: Current Every Day Smoker    Packs/day: 0.50    Years: 15.00    Pack years: 7.50    Types: Cigarettes  . Smokeless tobacco: Former Systems developer    Types: Snuff    Quit date: 10/20/2011  . Tobacco comment: form given  12-09-15  Substance Use Topics  . Alcohol use: Yes    Alcohol/week: 0.0 standard drinks    Comment: occasionally  . Drug use: No     Allergies   Chantix [varenicline] and Betadine [povidone iodine]   Review of Systems Review of Systems All other systems negative except as documented in the HPI. All pertinent positives and negatives as reviewed in the HPI.  Physical Exam Updated Vital Signs BP (!) 136/102 (BP Location: Right Arm)   Pulse 91   Temp 98.5 F (36.9 C) (Oral)   Resp 16   Ht 5\' 9"  (1.753 m)   Wt 83.9 kg   SpO2 99%   BMI 27.32 kg/m   Physical Exam Vitals signs and nursing note reviewed.  Constitutional:      General: He is not in acute distress.    Appearance: He is well-developed.  HENT:     Head: Normocephalic and atraumatic.  Eyes:     Pupils: Pupils are equal, round, and reactive to light.  Neck:     Musculoskeletal: Normal range of motion and neck supple.  Cardiovascular:     Rate and Rhythm: Normal rate and regular rhythm.     Heart sounds: Normal heart sounds. No murmur. No friction rub. No gallop.   Pulmonary:     Effort: Pulmonary effort is normal. No respiratory distress.     Breath sounds: Normal breath sounds. No decreased breath sounds, wheezing or rhonchi.  Chest:     Chest wall: Tenderness present.    Abdominal:     General: Bowel sounds are normal. There is no distension.     Palpations: Abdomen is soft.     Tenderness: There is no abdominal tenderness.  Skin:    General: Skin is warm and dry.     Capillary Refill: Capillary refill takes less than 2 seconds.     Findings: No erythema or rash.  Neurological:     Mental Status: He is alert and oriented to person, place, and time.     Motor: No abnormal muscle tone.     Coordination: Coordination normal.  Psychiatric:        Behavior: Behavior normal.      ED Treatments / Results  Labs (all labs ordered are listed, but only abnormal results are displayed) Labs Reviewed   BASIC METABOLIC PANEL - Abnormal; Notable for the following components:      Result Value   Glucose, Bld 100 (*)    All other components within normal limits  CBC - Abnormal; Notable for the following components:   WBC 11.8 (*)    All other components within normal limits  TROPONIN I  TROPONIN I    EKG    Radiology Dg Chest 2 View  Result Date: 04/10/2019 CLINICAL DATA:  Left chest pain and diarrhea EXAM: CHEST - 2 VIEW COMPARISON:  09/22/2017 FINDINGS: Normal heart size and mediastinal contours. No acute infiltrate or edema. No effusion or pneumothorax. No acute osseous findings. Artifact from EKG leads. IMPRESSION: Negative chest. Electronically Signed   By: Monte Fantasia M.D.   On: 04/10/2019 10:48    Procedures Procedures (including critical care time)  Medications Ordered in ED Medications  sodium chloride flush (NS) 0.9 % injection 3 mL (has no administration in time range)  ketorolac (TORADOL) 30 MG/ML injection 30 mg (30 mg Intravenous Given 04/10/19 1429)     Initial Impression / Assessment and Plan / ED Course  I have reviewed the triage vital signs and the nursing notes.  Pertinent labs & imaging results that were available during my care of the patient were reviewed by me and considered in my medical decision making (see chart for details).        Patient most likely has musculoskeletal chest pain based on his physical exam findings.  His initial troponin was negative and awaiting a second troponin.  I feel the patient most likely will be able to be discharged home.  Final Clinical Impressions(s) / ED Diagnoses   Final diagnoses:  None    ED Discharge Orders    None       Dalia Heading, PA-C 04/10/19 1456    Charlesetta Shanks, MD 04/20/19 1055

## 2019-04-10 NOTE — ED Triage Notes (Signed)
Pt. Stated, Ive had chest pain and diarrhea started this morning.

## 2019-04-10 NOTE — ED Notes (Signed)
Reports diarrhea starting this morning, after 5 x of diarrhea patient noticed he was having chest pain and feels short of breath.

## 2019-04-10 NOTE — Discharge Instructions (Signed)
Your chest x-ray and EKG did not show any abnormalities.  Your heart enzyme tests were both negative.  This most likely was chest wall pain especially since it hurts worse with movement and palpation.  Return here as needed.

## 2019-04-17 ENCOUNTER — Other Ambulatory Visit: Payer: Self-pay

## 2019-04-17 ENCOUNTER — Ambulatory Visit (INDEPENDENT_AMBULATORY_CARE_PROVIDER_SITE_OTHER): Payer: BC Managed Care – PPO | Admitting: Family Medicine

## 2019-04-17 ENCOUNTER — Encounter: Payer: Self-pay | Admitting: Family Medicine

## 2019-04-17 VITALS — BP 140/80 | HR 105 | Temp 98.3°F | Ht 69.0 in | Wt 180.3 lb

## 2019-04-17 DIAGNOSIS — R51 Headache: Secondary | ICD-10-CM

## 2019-04-17 DIAGNOSIS — R519 Headache, unspecified: Secondary | ICD-10-CM | POA: Insufficient documentation

## 2019-04-17 DIAGNOSIS — IMO0001 Reserved for inherently not codable concepts without codable children: Secondary | ICD-10-CM | POA: Insufficient documentation

## 2019-04-17 DIAGNOSIS — F17200 Nicotine dependence, unspecified, uncomplicated: Secondary | ICD-10-CM

## 2019-04-17 DIAGNOSIS — R0789 Other chest pain: Secondary | ICD-10-CM | POA: Diagnosis not present

## 2019-04-17 DIAGNOSIS — F430 Acute stress reaction: Secondary | ICD-10-CM | POA: Diagnosis not present

## 2019-04-17 DIAGNOSIS — K582 Mixed irritable bowel syndrome: Secondary | ICD-10-CM

## 2019-04-17 DIAGNOSIS — G8929 Other chronic pain: Secondary | ICD-10-CM

## 2019-04-17 DIAGNOSIS — R638 Other symptoms and signs concerning food and fluid intake: Secondary | ICD-10-CM

## 2019-04-17 NOTE — Assessment & Plan Note (Signed)
Diarrhea from ED visit is better Disc plan to stop caffeine and eat more fiber (fruit and veg)  Also drink more water

## 2019-04-17 NOTE — Assessment & Plan Note (Signed)
Disc in detail risks of smoking and possible outcomes including copd, vascular/ heart disease, cancer , respiratory and sinus infections  Pt voices understanding  

## 2019-04-17 NOTE — Assessment & Plan Note (Signed)
Migraine - L side /lifelong Disc role of hydration and also caffeine  Plan to cut coffee by 8 oz per week until none (will also help sleep)  Also inc water to 64 oz per day  If no imp- will update  Also looking to reduce stress by moving out of in [place] with alcoholic

## 2019-04-17 NOTE — Patient Instructions (Addendum)
Aim to gradually cut out caffeine  Cut coffee by 8 ounces per week until none - to prevent headaches  Dehydration also causes headaches  Eliminating caffeine will also help your sleep a lot   Aim for 64 oz of water per day (you may need more than that) If thirst and headaches do not improve let me know  Replace coffee and soda with water   Try to eat more fruit and vegetables  More green leafy vegetables/ salads   Keep looking for a rental - getting out of the house you are living in will make a big difference   Let us know if you want to see a counselor in the future for mood/stress   Ibuprofen is ok for shoulder/chest -take it with food  Use heat when it is sore  Try to favor the right side with lifting
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
## 2019-04-17 NOTE — Assessment & Plan Note (Signed)
Without polyuria  Suspect due to losses working outdoors  Enc 64 oz of water daily  Reassuring random glucose level of 100 for last ED visit

## 2019-04-17 NOTE — Assessment & Plan Note (Signed)
Improved from ED visit  Suspect strain from heavy lifting  Continue nsaid prn with food /heat and relative rest when able (change sides with lifting)

## 2019-06-18 ENCOUNTER — Other Ambulatory Visit: Payer: Self-pay | Admitting: Nurse Practitioner

## 2019-06-18 ENCOUNTER — Other Ambulatory Visit: Payer: Self-pay | Admitting: Family Medicine

## 2020-03-25 ENCOUNTER — Encounter: Payer: Self-pay | Admitting: Family Medicine

## 2020-03-25 ENCOUNTER — Other Ambulatory Visit: Payer: Self-pay

## 2020-03-25 ENCOUNTER — Ambulatory Visit (INDEPENDENT_AMBULATORY_CARE_PROVIDER_SITE_OTHER): Payer: BC Managed Care – PPO | Admitting: Family Medicine

## 2020-03-25 DIAGNOSIS — H6123 Impacted cerumen, bilateral: Secondary | ICD-10-CM | POA: Diagnosis not present

## 2020-03-25 DIAGNOSIS — H6120 Impacted cerumen, unspecified ear: Secondary | ICD-10-CM | POA: Insufficient documentation

## 2020-03-25 NOTE — Patient Instructions (Addendum)
Right ear is clear  Use debrox in the left ear to soften up ear wax at least 3 times per week  Then follow up with [place] in 10-14 days to try an flush it again   If you develop worse ear pain or any new symptoms let us know in the meantime   In the future use of debrox every 2 weeks or so can help prevent problems  Also avoid q tips which can push wax in further

## 2020-03-25 NOTE — Assessment & Plan Note (Signed)
Bilateral  R ear cleared with simple irrigation  L ear partially cleared-cerumen was harder to remove inst pt to use debrox (put some in here as well) L ear 3 times weekly and f/u 10-14 days to try irrigation again   He may benefit from debrox bilat ears every 2 weeks or so to help keep ears clear Also enc to avoid q tip use

## 2020-03-25 NOTE — Progress Notes (Signed)
Subjective:    Patient ID: Donald Merritt, male    DOB: [birth year], 34 y.o.   MRN: [id number]  This visit occurred during the SARS-CoV-2 public health emergency.  Safety protocols were in place, including screening questions prior to the visit, additional usage of staff PPE, and extensive cleaning of exam room while observing appropriate contact time as indicated for disinfecting solutions.    HPI Pt presents with c/o ear wax impaction -both ears  Has used a lavage kit at home  Did not work  No drainage A little discomfort   Both ears feel the same  Hearing is muffled   Current smoker   No nasal symptoms   Patient Active Problem List   Diagnosis Date Noted   Cerumen impaction 03/25/2020   Chest wall pain 04/17/2019   Chronic headaches 04/17/2019   Stress reaction 04/17/2019   Thirst 04/17/2019   IBS (irritable bowel syndrome) 01/04/2017   Smoking 07/22/2015   Hypertriglyceridemia 03/13/2014   Screening for lipoid disorders 03/05/2014   GERD (gastroesophageal reflux disease) 09/04/2011   History of kidney stones 03/12/2010   MICROSCOPIC HEMATURIA 03/12/2010   HYPERSOMNIA, PERSISTENT 03/15/2008   Allergic rhinitis 02/13/2008   DEPRESSION 04/27/2007   Past Medical History:  Diagnosis Date   Allergy    Depression    Environmental allergies    GERD (gastroesophageal reflux disease)    Other and unspecified noninfectious gastroenteritis and colitis(558.9)    Other malaise and fatigue    Panic attack    Renal stones    Tobacco use disorder    Unspecified sleep apnea    Past Surgical History:  Procedure Laterality Date   neg hx     Social History   Tobacco Use   Smoking status: Current Every Day Smoker    Packs/day: 1.00    Years: 15.00    Pack years: 15.00    Types: Cigarettes   Smokeless tobacco: Former Systems developer    Types: Snuff    Quit date: 10/20/2011   Tobacco comment: form given 12-09-15  Substance Use Topics   Alcohol use:  Yes    Alcohol/week: 0.0 standard drinks    Comment: occasionally   Drug use: No   Family History  Problem Relation Age of Onset   Asthma Mother    Hypertension Father    Heart disease Paternal Grandmother    Diabetes Paternal Grandmother    Lactose intolerance Daughter    Bone cancer Paternal Grandfather    Colon cancer Neg Hx    Esophageal cancer Neg Hx    Pancreatic cancer Neg Hx    Stomach cancer Neg Hx    Kidney disease Neg Hx    Liver disease Neg Hx    Allergies  Allergen Reactions   Chantix [Varenicline] Shortness Of Breath   Betadine [Povidone Iodine] Rash   Current Outpatient Medications on File Prior to Visit  Medication Sig Dispense Refill   acetaminophen (TYLENOL) 325 MG tablet Take 650 mg by mouth every 6 (six) hours as needed.     fluticasone (FLONASE) 50 MCG/ACT nasal spray Place 1 spray into both nostrils daily. 16 g 0   glycopyrrolate (ROBINUL) 2 MG tablet TAKE 1 TABLET (2 MG TOTAL) BY MOUTH 2 (TWO) TIMES DAILY. AS NEEDED FOR ABDOMINAL PAIN 180 tablet 0   loratadine (CLARITIN) 10 MG tablet Take 10 mg by mouth daily. 24 hours     pantoprazole (PROTONIX) 40 MG tablet TAKE 1 TABLET BY MOUTH TWICE A DAY 180 tablet 1  hyoscyamine (LEVSIN) 0.125 MG/ML solution Take 0.125 mg by mouth every 6 (six) hours as needed.     No current facility-administered medications on file prior to visit.    Review of Systems  Constitutional: Negative for activity change, appetite change, fatigue, fever and unexpected weight change.  HENT: Positive for ear pain and hearing loss. Negative for congestion, ear discharge, rhinorrhea, sore throat and trouble swallowing.   Eyes: Negative for pain, redness, itching and visual disturbance.  Respiratory: Negative for cough, chest tightness, shortness of breath and wheezing.   Cardiovascular: Negative for chest pain and palpitations.  Gastrointestinal: Negative for abdominal pain, blood in stool, constipation, diarrhea and  nausea.  Endocrine: Negative for cold intolerance, heat intolerance, polydipsia and polyuria.  Genitourinary: Negative for difficulty urinating, dysuria, frequency and urgency.  Musculoskeletal: Negative for arthralgias, joint swelling and myalgias.  Skin: Negative for pallor and rash.  Neurological: Negative for dizziness, tremors, weakness, numbness and headaches.  Hematological: Negative for adenopathy. Does not bruise/bleed easily.  Psychiatric/Behavioral: Negative for decreased concentration and dysphoric mood. The patient is not nervous/anxious.        Objective:   Physical Exam Constitutional:      General: He is not in acute distress.    Appearance: Normal appearance. He is normal weight. He is not ill-appearing.  HENT:     Right Ear: There is impacted cerumen.     Left Ear: There is impacted cerumen.     Ears:     Comments: Bilateral cerumen impaction   After simple ear irrigation : R ear clear-nl appearing TM L ear-small amt left centrally (dry appearing cerumen) No erythema or swelling of canals    Nose: No congestion.     Mouth/Throat:     Mouth: Mucous membranes are moist.  Eyes:     General:        Right eye: No discharge.        Left eye: No discharge.     Conjunctiva/sclera: Conjunctivae normal.     Pupils: Pupils are equal, round, and reactive to light.  Cardiovascular:     Rate and Rhythm: Normal rate and regular rhythm.  Musculoskeletal:     Cervical back: Normal range of motion and neck supple.  Skin:    Coloration: Skin is not pale.     Findings: No erythema or rash.  Neurological:     Mental Status: He is alert.     Cranial Nerves: No cranial nerve deficit.  Psychiatric:        Mood and Affect: Mood normal.           Assessment & Plan:   Problem List Items Addressed This Visit      Nervous and Auditory   Cerumen impaction    Bilateral  R ear cleared with simple irrigation  L ear partially cleared-cerumen was harder to remove inst pt  to use debrox (put some in here as well) L ear 3 times weekly and f/u 10-14 days to try irrigation again   He may benefit from debrox bilat ears every 2 weeks or so to help keep ears clear Also enc to avoid q tip use

## 2020-04-05 ENCOUNTER — Other Ambulatory Visit: Payer: Self-pay

## 2020-04-05 ENCOUNTER — Ambulatory Visit (INDEPENDENT_AMBULATORY_CARE_PROVIDER_SITE_OTHER): Payer: BC Managed Care – PPO | Admitting: Family Medicine

## 2020-04-05 ENCOUNTER — Encounter: Payer: Self-pay | Admitting: Family Medicine

## 2020-04-05 VITALS — BP 108/70 | HR 77 | Temp 97.3°F | Ht 69.0 in | Wt 177.4 lb

## 2020-04-05 DIAGNOSIS — R519 Headache, unspecified: Secondary | ICD-10-CM | POA: Diagnosis not present

## 2020-04-05 DIAGNOSIS — H6122 Impacted cerumen, left ear: Secondary | ICD-10-CM

## 2020-04-05 DIAGNOSIS — G8929 Other chronic pain: Secondary | ICD-10-CM | POA: Diagnosis not present

## 2020-04-05 MED ORDER — SUMATRIPTAN SUCCINATE 50 MG PO TABS: 50.0000 mg | ORAL_TABLET | Freq: Once | ORAL | 0 refills | Status: DC | PRN

## 2020-04-05 NOTE — Patient Instructions (Addendum)
Stop at check out to schedule a physical    An anti inflammatory medicine with some caffeine is good for migraine  (however avoid caffeine on a daily basis)  Also ice on the area that hurts when it happens  Regular exercise and fluids and sleep habits help  Also healthy diet Keep a headache journal to see what your patterns or triggers may be as well   imitrex -try for headache as needed for rescue

## 2020-04-05 NOTE — Progress Notes (Signed)
Subjective:    Patient ID: Donald Merritt, male    DOB: 12-20-1985, 34 y.o.   MRN: [id number]  This visit occurred during the SARS-CoV-2 public health emergency.  Safety protocols were in place, including screening questions prior to the visit, additional usage of staff PPE, and extensive cleaning of exam room while observing appropriate contact time as indicated for disinfecting solutions.    HPI Pt presents with ear fullness /cerumen  Last visit - R ear cleared with irrigation and L one was stubborn Sent home to use debrox regularly and return for re eval    Still fighting headaches  Needs to schedule physical  Have recommended cutting caffeine and inc water intake  Triggered by stress   Every 2 weeks - has a migraine  Did stop coffee  Stopped all alcohol also (which helped stomach issues also) Does get nauseated and has to leave work with migraine    Patient Active Problem List   Diagnosis Date Noted  . Cerumen impaction 03/25/2020  . Chest wall pain 04/17/2019  . Chronic headaches 04/17/2019  . Stress reaction 04/17/2019  . Thirst 04/17/2019  . IBS (irritable bowel syndrome) 01/04/2017  . Smoking 07/22/2015  . Hypertriglyceridemia 03/13/2014  . Screening for lipoid disorders 03/05/2014  . GERD (gastroesophageal reflux disease) 09/04/2011  . History of kidney stones 03/12/2010  . MICROSCOPIC HEMATURIA 03/12/2010  . [place], PERSISTENT 03/15/2008  . Allergic rhinitis 02/13/2008  . DEPRESSION 04/27/2007   Past Medical History:  Diagnosis Date  . Allergy   . Depression   . Environmental allergies   . GERD (gastroesophageal reflux disease)   . Other and unspecified noninfectious gastroenteritis and colitis(558.9)   . Other malaise and fatigue   . Panic attack   . Renal stones   . Tobacco use disorder   . Unspecified sleep apnea    Past Surgical History:  Procedure Laterality Date  . neg hx     Social History   Tobacco Use  . Smoking status:  Current Every Day Smoker    Packs/day: 1.00    Years: 15.00    Pack years: 15.00    Types: Cigarettes  . Smokeless tobacco: Former Systems developer    Types: Snuff    Quit date: 10/20/2011  . Tobacco comment: form given 12-09-15  Vaping Use  . Vaping Use: Former  Substance Use Topics  . Alcohol use: Yes    Alcohol/week: 0.0 standard drinks    Comment: occasionally  . Drug use: No   Family History  Problem Relation Age of Onset  . Asthma Mother   . Hypertension Father   . Heart disease Paternal Grandmother   . Diabetes Paternal Grandmother   . Lactose intolerance Daughter   . Bone cancer Paternal Grandfather   . Colon cancer Neg Hx   . Esophageal cancer Neg Hx   . Pancreatic cancer Neg Hx   . Stomach cancer Neg Hx   . Kidney disease Neg Hx   . Liver disease Neg Hx    Allergies  Allergen Reactions  . Chantix [Varenicline] Shortness Of Breath  . Betadine [Povidone Iodine] Rash   Current Outpatient Medications on File Prior to Visit  Medication Sig Dispense Refill  . acetaminophen (TYLENOL) 325 MG tablet Take 650 mg by mouth every 6 (six) hours as needed.    . fluticasone (FLONASE) 50 MCG/ACT nasal spray Place 1 spray into both nostrils daily. 16 g 0  . glycopyrrolate (ROBINUL) 2 MG tablet TAKE 1 TABLET (2 MG  TOTAL) BY MOUTH 2 (TWO) TIMES DAILY. AS NEEDED FOR ABDOMINAL PAIN 180 tablet 0  . hyoscyamine (LEVSIN) 0.125 MG/ML solution Take 0.125 mg by mouth every 6 (six) hours as needed.    . loratadine (CLARITIN) 10 MG tablet Take 10 mg by mouth daily. 24 hours    . pantoprazole (PROTONIX) 40 MG tablet TAKE 1 TABLET BY MOUTH TWICE A DAY 180 tablet 1   No current facility-administered medications on file prior to visit.    Review of Systems  Constitutional: Negative for activity change, appetite change, fatigue, fever and unexpected weight change.  HENT: Positive for ear pain and hearing loss. Negative for congestion, rhinorrhea, sore throat and trouble swallowing.   Eyes: Negative for  pain, redness, itching and visual disturbance.  Respiratory: Negative for cough, chest tightness, shortness of breath and wheezing.   Cardiovascular: Negative for chest pain and palpitations.  Gastrointestinal: Negative for abdominal pain, blood in stool, constipation, diarrhea and nausea.  Endocrine: Negative for cold intolerance, heat intolerance, polydipsia and polyuria.  Genitourinary: Negative for difficulty urinating, dysuria, frequency and urgency.  Musculoskeletal: Negative for arthralgias, joint swelling and myalgias.  Skin: Negative for pallor and rash.  Neurological: Positive for headaches. Negative for dizziness, tremors, weakness and numbness.  Hematological: Negative for adenopathy. Does not bruise/bleed easily.  Psychiatric/Behavioral: Negative for decreased concentration and dysphoric mood. The patient is not nervous/anxious.        Objective:   Physical Exam Constitutional:      General: He is not in acute distress.    Appearance: Normal appearance. He is normal weight. He is not ill-appearing.  HENT:     Head: Normocephalic and atraumatic.     Right Ear: Tympanic membrane, ear canal and external ear normal. There is impacted cerumen.     Left Ear: External ear normal. There is impacted cerumen.     Ears:     Comments: Cerumen impaction on the L  Resolved with simple ear irrigation  Clear canal and nl TM Improved hearing    Mouth/Throat:     Mouth: Mucous membranes are moist.  Eyes:     General:        Right eye: No discharge.        Left eye: No discharge.     Extraocular Movements: Extraocular movements intact.     Conjunctiva/sclera: Conjunctivae normal.     Pupils: Pupils are equal, round, and reactive to light.  Cardiovascular:     Rate and Rhythm: Normal rate and regular rhythm.  Skin:    General: Skin is warm and dry.     Coloration: Skin is not pale.     Findings: No erythema or rash.  Neurological:     Mental Status: He is alert.     Cranial  Nerves: No cranial nerve deficit.     Motor: No weakness.     Coordination: Coordination normal.  Psychiatric:        Mood and Affect: Mood normal.           Assessment & Plan:   Problem List Items Addressed This Visit      Nervous and Auditory   Cerumen impaction    No only on L  Used debrox at home Easily removed with simple irrigation and relief today Disc use of debrox on a schedule and this should help head off future problems        Other   Chronic headaches - Primary    These persist  Pt did  quit coffee and alcohol (commended)  Enc regular exercise and sleep habits  Trial of imitrex 50 (can go up to 100 if effective) Antic guidance Handouts given  F/u planned        Relevant Medications   SUMAtriptan (IMITREX) 50 MG tablet

## 2020-04-05 NOTE — Assessment & Plan Note (Signed)
No only on L  Used debrox at home Easily removed with simple irrigation and relief today Disc use of debrox on a schedule and this should help head off future problems

## 2020-04-06 NOTE — Assessment & Plan Note (Signed)
These persist  Pt did quit coffee and alcohol (commended)  Enc regular exercise and sleep habits  Trial of imitrex 50 (can go up to 100 if effective) Antic guidance Handouts given  F/u planned

## 2020-04-19 ENCOUNTER — Other Ambulatory Visit: Payer: Self-pay

## 2020-04-19 ENCOUNTER — Ambulatory Visit: Payer: BC Managed Care – PPO | Admitting: Family Medicine

## 2020-04-19 ENCOUNTER — Encounter: Payer: Self-pay | Admitting: Family Medicine

## 2020-04-19 ENCOUNTER — Ambulatory Visit (INDEPENDENT_AMBULATORY_CARE_PROVIDER_SITE_OTHER): Payer: BC Managed Care – PPO | Admitting: Family Medicine

## 2020-04-19 VITALS — BP 122/84 | HR 79 | Temp 97.3°F | Ht 68.5 in | Wt 176.0 lb

## 2020-04-19 DIAGNOSIS — F17200 Nicotine dependence, unspecified, uncomplicated: Secondary | ICD-10-CM

## 2020-04-19 DIAGNOSIS — G8929 Other chronic pain: Secondary | ICD-10-CM

## 2020-04-19 DIAGNOSIS — E781 Pure hyperglyceridemia: Secondary | ICD-10-CM

## 2020-04-19 DIAGNOSIS — R519 Headache, unspecified: Secondary | ICD-10-CM

## 2020-04-19 DIAGNOSIS — Z0000 Encounter for general adult medical examination without abnormal findings: Secondary | ICD-10-CM

## 2020-04-19 DIAGNOSIS — K219 Gastro-esophageal reflux disease without esophagitis: Secondary | ICD-10-CM

## 2020-04-19 DIAGNOSIS — Z23 Encounter for immunization: Secondary | ICD-10-CM

## 2020-04-19 DIAGNOSIS — IMO0001 Reserved for inherently not codable concepts without codable children: Secondary | ICD-10-CM

## 2020-04-19 LAB — COMPREHENSIVE METABOLIC PANEL
ALT: 17 U/L (ref 0–53)
AST: 15 U/L (ref 0–37)
Albumin: 4.5 g/dL (ref 3.5–5.2)
Alkaline Phosphatase: 78 U/L (ref 39–117)
BUN: 15 mg/dL (ref 6–23)
CO2: 28 mEq/L (ref 19–32)
Calcium: 9.3 mg/dL (ref 8.4–10.5)
Chloride: 105 mEq/L (ref 96–112)
Creatinine, Ser: 1.01 mg/dL (ref 0.40–1.50)
GFR: 83.92 mL/min (ref >60.00)
Glucose, Bld: 91 mg/dL (ref 70–99)
Potassium: 4.6 mEq/L (ref 3.5–5.1)
Sodium: 139 mEq/L (ref 135–145)
Total Bilirubin: 0.4 mg/dL (ref 0.2–1.2)
Total Protein: 6.5 g/dL (ref 6.0–8.3)

## 2020-04-19 LAB — CBC WITH DIFFERENTIAL/PLATELET
Basophils Absolute: 0.1 10*3/uL (ref 0.0–0.1)
Basophils Relative: 0.9 % (ref 0.0–3.0)
Eosinophils Absolute: 0.6 10*3/uL (ref 0.0–0.7)
Eosinophils Relative: 6.3 % — ABNORMAL HIGH (ref 0.0–5.0)
HCT: 46.1 % (ref 39.0–52.0)
Hemoglobin: 15.8 g/dL (ref 13.0–17.0)
Lymphocytes Relative: 19.1 % (ref 12.0–46.0)
Lymphs Abs: 1.8 10*3/uL (ref 0.7–4.0)
MCHC: 34.2 g/dL (ref 30.0–36.0)
MCV: 88.7 fl (ref 78.0–100.0)
Monocytes Absolute: 0.6 10*3/uL (ref 0.1–1.0)
Monocytes Relative: 6.4 % (ref 3.0–12.0)
Neutro Abs: 6.4 10*3/uL (ref 1.4–7.7)
Neutrophils Relative %: 67.3 % (ref 43.0–77.0)
Platelets: 173 10*3/uL (ref 150.0–400.0)
RBC: 5.19 Mil/uL (ref 4.22–5.81)
RDW: 13 % (ref 11.5–15.5)
WBC: 9.4 10*3/uL (ref 4.0–10.5)

## 2020-04-19 LAB — LIPID PANEL
Cholesterol: 150 mg/dL (ref 0–200)
HDL: 33.6 mg/dL — ABNORMAL LOW (ref >39.00)
LDL Cholesterol: 81 mg/dL (ref 0–99)
NonHDL: 116.39
Total CHOL/HDL Ratio: 4
Triglycerides: 177 mg/dL — ABNORMAL HIGH (ref 0.0–149.0)
VLDL: 35.4 mg/dL (ref 0.0–40.0)

## 2020-04-19 LAB — TSH: TSH: 1.25 u[IU]/mL (ref 0.35–4.50)

## 2020-04-19 MED ORDER — SUMATRIPTAN SUCCINATE 100 MG PO TABS: 100.0000 mg | ORAL_TABLET | Freq: Once | ORAL | 11 refills | Status: DC

## 2020-04-19 MED ORDER — GABAPENTIN 100 MG PO CAPS: 100.0000 mg | ORAL_CAPSULE | Freq: Two times a day (BID) | ORAL | 3 refills | Status: DC

## 2020-04-19 NOTE — Patient Instructions (Addendum)
Tetanus shot today  Labs today   Keep thinking about a covid shot   I increased dose of imitrex to try for headache rescue  To prevent headaches - take gabapentin 100 mg at bedtime for a week  Then increase to morning and evening  In 1 month call to let me know how headaches are   If any intolerable side effects-stop it and let us know

## 2020-04-19 NOTE — Progress Notes (Signed)
Subjective:    Patient ID: Donald Merritt, male    DOB: 02/16/1986, 34 y.o.   MRN: [id number]  This visit occurred during the SARS-CoV-2 public health emergency.  Safety protocols were in place, including screening questions prior to the visit, additional usage of staff PPE, and extensive cleaning of exam room while observing appropriate contact time as indicated for disinfecting solutions.    HPI Here for health maintenance exam and to review chronic medical problems    Wt Readings from Last 3 Encounters:  04/19/20 176 lb (79.8 kg)  04/05/20 177 lb 6 oz (80.5 kg)  03/25/20 180 lb 11.2 oz (82 kg)   26.37 kg/m  Good /stable weight  Eating is so/so   Work- active job/outdoors and warehouse     BP Readings from Last 3 Encounters:  04/19/20 122/84  04/05/20 108/70  03/25/20 134/86   Pulse Readings from Last 3 Encounters:  04/19/20 79  04/05/20 77  03/25/20 88     Td 1/05-will update  Flu vaccine - usually gets every fall  covid status -declines vaccine at this time   No recent exposures    Hep C/ HIV screening  Declines  Low risk  Monogamous  No hx of IV drug abuse    Colonoscopy 10/19 with 5 y recall  (serrated polyp)    Prostate health  No known fam hx of prostate ca  occ nocturia  No problems with urination    Smoking status - 1ppd  May quit in the future-not ready yet   Stress level is up and down- better than it was  He quit drinking etoh     Hypertriglyceridemia  Lab Results  Component Value Date   CHOL 133 06/15/2014   HDL 29.30 (L) 06/15/2014   LDLCALC 65 06/15/2014   TRIG 192.0 (H) 06/15/2014   CHOLHDL 5 06/15/2014   Due for labs  Low HDL in the past  Does eat some fish  Diet -- eats some fried /fast food but not all the time    Migraine headaches Given px for imitrex 50 mg to try at last visit  It helped but took a long time to work  Wants to try 100 mg dose   Headaches almost every day  Waxes and wanes with them    Mood -h/o dep and stress reaction   Wears glasses - eye exam every January    Patient Active Problem List   Diagnosis Date Noted  . Routine general medical examination at a health care facility 04/19/2020  . Cerumen impaction 03/25/2020  . Chest wall pain 04/17/2019  . Chronic headaches 04/17/2019  . Stress reaction 04/17/2019  . IBS (irritable bowel syndrome) 01/04/2017  . Smoking 07/22/2015  . Hypertriglyceridemia 03/13/2014  . Screening for lipoid disorders 03/05/2014  . GERD (gastroesophageal reflux disease) 09/04/2011  . History of kidney stones 03/12/2010  . MICROSCOPIC HEMATURIA 03/12/2010  . [place], PERSISTENT 03/15/2008  . Allergic rhinitis 02/13/2008  . DEPRESSION 04/27/2007   Past Medical History:  Diagnosis Date  . Allergy   . Depression   . Environmental allergies   . GERD (gastroesophageal reflux disease)   . Other and unspecified noninfectious gastroenteritis and colitis(558.9)   . Other malaise and fatigue   . Panic attack   . Renal stones   . Tobacco use disorder   . Unspecified sleep apnea    Past Surgical History:  Procedure Laterality Date  . neg hx     Social History  Tobacco Use  . Smoking status: Current Every Day Smoker    Packs/day: 1.00    Years: 15.00    Pack years: 15.00    Types: Cigarettes  . Smokeless tobacco: Former Systems developer    Types: Snuff    Quit date: 10/20/2011  . Tobacco comment: form given 12-09-15  Vaping Use  . Vaping Use: Former  Substance Use Topics  . Alcohol use: Not Currently    Alcohol/week: 0.0 standard drinks  . Drug use: No   Family History  Problem Relation Age of Onset  . Asthma Mother   . Hypertension Father   . Heart disease Paternal Grandmother   . Diabetes Paternal Grandmother   . Lactose intolerance Daughter   . Bone cancer Paternal Grandfather   . Colon cancer Neg Hx   . Esophageal cancer Neg Hx   . Pancreatic cancer Neg Hx   . Stomach cancer Neg Hx   . Kidney disease Neg Hx   . Liver  disease Neg Hx    Allergies  Allergen Reactions  . Chantix [Varenicline] Shortness Of Breath  . Betadine [Povidone Iodine] Rash   Current Outpatient Medications on File Prior to Visit  Medication Sig Dispense Refill  . acetaminophen (TYLENOL) 325 MG tablet Take 650 mg by mouth every 6 (six) hours as needed.    . fluticasone (FLONASE) 50 MCG/ACT nasal spray Place 1 spray into both nostrils daily. 16 g 0  . glycopyrrolate (ROBINUL) 2 MG tablet TAKE 1 TABLET (2 MG TOTAL) BY MOUTH 2 (TWO) TIMES DAILY. AS NEEDED FOR ABDOMINAL PAIN 180 tablet 0  . hyoscyamine (LEVSIN) 0.125 MG/ML solution Take 0.125 mg by mouth every 6 (six) hours as needed.    . loratadine (CLARITIN) 10 MG tablet Take 10 mg by mouth daily. 24 hours    . pantoprazole (PROTONIX) 40 MG tablet TAKE 1 TABLET BY MOUTH TWICE A DAY 180 tablet 1   No current facility-administered medications on file prior to visit.    Review of Systems  Constitutional: Negative for activity change, appetite change, fatigue, fever and unexpected weight change.  HENT: Negative for congestion, rhinorrhea, sore throat and trouble swallowing.   Eyes: Negative for pain, redness, itching and visual disturbance.  Respiratory: Negative for cough, chest tightness, shortness of breath and wheezing.   Cardiovascular: Negative for chest pain and palpitations.  Gastrointestinal: Negative for abdominal pain, blood in stool, constipation, diarrhea and nausea.  Endocrine: Negative for cold intolerance, heat intolerance, polydipsia and polyuria.  Genitourinary: Negative for difficulty urinating, dysuria, frequency and urgency.  Musculoskeletal: Negative for arthralgias, joint swelling and myalgias.  Skin: Negative for pallor and rash.  Neurological: Positive for headaches. Negative for dizziness, tremors, weakness and numbness.  Hematological: Negative for adenopathy. Does not bruise/bleed easily.  Psychiatric/Behavioral: Negative for decreased concentration and  dysphoric mood. The patient is not nervous/anxious.        Objective:   Physical Exam Constitutional:      General: He is not in acute distress.    Appearance: Normal appearance. He is well-developed and normal weight. He is not ill-appearing or diaphoretic.  HENT:     Head: Normocephalic and atraumatic.     Right Ear: Tympanic membrane, ear canal and external ear normal.     Left Ear: Tympanic membrane, ear canal and external ear normal.     Nose: Nose normal. No congestion.     Mouth/Throat:     Mouth: Mucous membranes are moist.     Pharynx: Oropharynx is clear.  No posterior oropharyngeal erythema.  Eyes:     General: No scleral icterus.       Right eye: No discharge.        Left eye: No discharge.     Conjunctiva/sclera: Conjunctivae normal.     Pupils: Pupils are equal, round, and reactive to light.  Neck:     Thyroid: No thyromegaly.     Vascular: No carotid bruit or JVD.  Cardiovascular:     Rate and Rhythm: Normal rate and regular rhythm.     Pulses: Normal pulses.     Heart sounds: Normal heart sounds. No gallop.   Pulmonary:     Effort: Pulmonary effort is normal. No respiratory distress.     Breath sounds: Normal breath sounds. No wheezing or rales.     Comments: Good air exch Chest:     Chest wall: No tenderness.  Abdominal:     General: Bowel sounds are normal. There is no distension or abdominal bruit.     Palpations: Abdomen is soft. There is no mass.     Tenderness: There is no abdominal tenderness.     Hernia: No hernia is present.  Musculoskeletal:        General: No tenderness.     Cervical back: Normal range of motion and neck supple. No rigidity. No muscular tenderness.     Right lower leg: No edema.     Left lower leg: No edema.  Lymphadenopathy:     Cervical: No cervical adenopathy.  Skin:    General: Skin is warm and dry.     Coloration: Skin is not pale.     Findings: No erythema or rash.     Comments: Solar lentigines diffusely     Neurological:     Mental Status: He is alert.     Cranial Nerves: No cranial nerve deficit.     Motor: No abnormal muscle tone.     Coordination: Coordination normal.     Gait: Gait normal.     Deep Tendon Reflexes: Reflexes are normal and symmetric. Reflexes normal.  Psychiatric:        Mood and Affect: Mood normal.        Cognition and Memory: Cognition and memory normal.           Assessment & Plan:   Problem List Items Addressed This Visit      Digestive   GERD (gastroesophageal reflux disease)    Symptoms are controlled wlth protonix and diet         Other   Hypertriglyceridemia    With low HDL Disc goals for lipids and reasons to control them Rev last labs with pt Rev low sat fat diet in detail Labs drawn today  Enc intake of fish for HDL as well as exercise       Smoking    Disc in detail risks of smoking and possible outcomes including copd, vascular/ heart disease, cancer , respiratory and sinus infections  Pt voices understanding States he is not ready to quit at this time      Chronic headaches    Chronic daily headache- migraine Some imp with imitrex 50 mg for rescue-px 100 mg to try  Also gabapentin 100 mg bid to start for prophylaxis Disc poss side eff in detail  If worse or intol side eff or mood change-inst to stop and update  Will see how he does and titrate up if helpful  Overall -good lifestyle changes for headaches-commended  Relevant Medications   gabapentin (NEURONTIN) 100 MG capsule   Routine general medical examination at a health care facility - Primary    Reviewed health habits including diet and exercise and skin cancer prevention Reviewed appropriate screening tests for age  Also reviewed health mt list, fam hx and immunization status , as well as social and family history   See HPI Labs reviewed  Enc smoking cessation  Also flu shot in the fall  Enc to consider covid imm-pt is strongly considering  utd  colonoscopy Labs for wellness today Td imm given        Relevant Orders   CBC with Differential/Platelet (Completed)   Comprehensive metabolic panel (Completed)   Lipid panel (Completed)   TSH (Completed)    Other Visit Diagnoses    Need for Td vaccine       Relevant Orders   Td vaccine greater than or equal to 7yo preservative free IM (Completed)

## 2020-04-21 NOTE — Assessment & Plan Note (Addendum)
Disc in detail risks of smoking and possible outcomes including copd, vascular/ heart disease, cancer , respiratory and sinus infections  Pt voices understanding States he is not ready to quit at this time

## 2020-04-21 NOTE — Assessment & Plan Note (Addendum)
Reviewed health habits including diet and exercise and skin cancer prevention Reviewed appropriate screening tests for age  Also reviewed health mt list, fam hx and immunization status , as well as social and family history   See HPI Labs reviewed  Enc smoking cessation  Also flu shot in the fall  Enc to consider covid imm-pt is strongly considering  utd colonoscopy Labs for wellness today Td imm given

## 2020-04-21 NOTE — Assessment & Plan Note (Signed)
Chronic daily headache- migraine Some imp with imitrex 50 mg for rescue-px 100 mg to try  Also gabapentin 100 mg bid to start for prophylaxis Disc poss side eff in detail  If worse or intol side eff or mood change-inst to stop and update  Will see how he does and titrate up if helpful  Overall -good lifestyle changes for headaches-commended

## 2020-04-21 NOTE — Assessment & Plan Note (Signed)
Symptoms are controlled wlth protonix and diet

## 2020-04-21 NOTE — Assessment & Plan Note (Signed)
With low HDL Disc goals for lipids and reasons to control them Rev last labs with pt Rev low sat fat diet in detail Labs drawn today  Enc intake of fish for HDL as well as exercise

## 2020-05-30 ENCOUNTER — Encounter (HOSPITAL_COMMUNITY): Payer: Self-pay | Admitting: Emergency Medicine

## 2020-05-30 ENCOUNTER — Ambulatory Visit (HOSPITAL_COMMUNITY)
Admission: EM | Admit: 2020-05-30 | Discharge: 2020-05-30 | Disposition: A | Discharge status: Home / Self Care | Payer: BC Managed Care – PPO | Attending: Family Medicine | Admitting: Family Medicine

## 2020-05-30 DIAGNOSIS — E781 Pure hyperglyceridemia: Secondary | ICD-10-CM | POA: Insufficient documentation

## 2020-05-30 DIAGNOSIS — F17210 Nicotine dependence, cigarettes, uncomplicated: Secondary | ICD-10-CM | POA: Diagnosis not present

## 2020-05-30 DIAGNOSIS — J069 Acute upper respiratory infection, unspecified: Secondary | ICD-10-CM

## 2020-05-30 DIAGNOSIS — K589 Irritable bowel syndrome without diarrhea: Secondary | ICD-10-CM | POA: Insufficient documentation

## 2020-05-30 DIAGNOSIS — Z20822 Contact with and (suspected) exposure to covid-19: Secondary | ICD-10-CM | POA: Insufficient documentation

## 2020-05-30 DIAGNOSIS — K219 Gastro-esophageal reflux disease without esophagitis: Secondary | ICD-10-CM | POA: Diagnosis not present

## 2020-05-30 DIAGNOSIS — F329 Major depressive disorder, single episode, unspecified: Secondary | ICD-10-CM | POA: Insufficient documentation

## 2020-05-30 DIAGNOSIS — Z79899 Other long term (current) drug therapy: Secondary | ICD-10-CM | POA: Insufficient documentation

## 2020-05-30 MED ORDER — LIDOCAINE VISCOUS HCL 2 % MT SOLN
OROMUCOSAL | Status: AC
  Filled 2020-05-30: qty 15

## 2020-05-30 MED ORDER — ALUM & MAG HYDROXIDE-SIMETH 200-200-20 MG/5ML PO SUSP
ORAL | Status: AC
  Filled 2020-05-30: qty 30

## 2020-05-30 MED ORDER — LIDOCAINE VISCOUS HCL 2 % MT SOLN
15.0000 mL | Freq: Once | OROMUCOSAL | Status: AC
  Administered 2020-05-30: 15 mL via ORAL

## 2020-05-30 MED ORDER — ALUM & MAG HYDROXIDE-SIMETH 200-200-20 MG/5ML PO SUSP
30.0000 mL | Freq: Once | ORAL | Status: AC
  Administered 2020-05-30: 30 mL via ORAL

## 2020-05-30 MED ORDER — BENZONATATE 200 MG PO CAPS: 200.0000 mg | ORAL_CAPSULE | Freq: Three times a day (TID) | ORAL | 0 refills | Status: AC | PRN

## 2020-05-30 NOTE — Discharge Instructions (Signed)
Covid test pending, monitor my chart for results Rest and fluids Tessalon/benzonatate every 8 hours for cough Begin Mucinex, also continue daily allergy pill Honey and hot tea for any sore throat Tylenol and ibuprofen for body aches, headache and any fevers Follow-up for any concerns, worsening or persistent symptoms

## 2020-05-30 NOTE — ED Triage Notes (Signed)
Pt presents with dry cough, sore throat, nausea and vomiting.States has burning sensation in chest. Sxs started today around 3pm. States was around coworker on Monday and Tuesday that tested positive for COVID.  Denies loss of taste or smell. States has a hx stomach pain due to reflux.   Pt states that he does not want to be prescribed amoxicillin due to children being highly allergic to the medication.

## 2020-05-31 LAB — SARS CORONAVIRUS 2 (TAT 6-24 HRS): SARS Coronavirus 2: NEGATIVE

## 2020-06-01 NOTE — ED Provider Notes (Signed)
[name]    CSN: [id number] Arrival date & time: 05/30/20  1656      History   Chief Complaint Chief Complaint  Patient presents with   Cough    HPI Donald Merritt is a 34 y.o. male presenting today for evaluation of URI symptoms.  Patient reports around 3 PM this afternoon he began to feel fatigued, lightheaded along with developing cough and sore throat.  Had some mild nausea and vomiting with a burning sensation in his chest.  He does report he had a Covid exposure at work which he found out today as well.  Denies loss of taste or smell.  Has history of GERD.  Denies any chest pain or shortness of breath.  Denies any known fevers.  HPI  Past Medical History:  Diagnosis Date   Allergy    Depression    Environmental allergies    GERD (gastroesophageal reflux disease)    Other and unspecified noninfectious gastroenteritis and colitis(558.9)    Other malaise and fatigue    Panic attack    Renal stones    Tobacco use disorder    Unspecified sleep apnea     Patient Active Problem List   Diagnosis Date Noted   Routine general medical examination at a health care facility 04/19/2020   Cerumen impaction 03/25/2020   Chest wall pain 04/17/2019   Chronic headaches 04/17/2019   Stress reaction 04/17/2019   IBS (irritable bowel syndrome) 01/04/2017   Smoking 07/22/2015   Hypertriglyceridemia 03/13/2014   Screening for lipoid disorders 03/05/2014   GERD (gastroesophageal reflux disease) 09/04/2011   History of kidney stones 03/12/2010   MICROSCOPIC HEMATURIA 03/12/2010   HYPERSOMNIA, PERSISTENT 03/15/2008   Allergic rhinitis 02/13/2008   DEPRESSION 04/27/2007    Past Surgical History:  Procedure Laterality Date   neg hx         Home Medications    Prior to Admission medications   Medication Sig Start Date End Date Taking? Authorizing Provider  acetaminophen (TYLENOL) 325 MG tablet Take 650 mg by mouth every 6 (six) hours  as needed.   Yes [provider]  fluticasone (FLONASE) 50 MCG/ACT nasal spray Place 1 spray into both nostrils daily. 11/27/17  Yes Caccavale, Sophia, PA-C  gabapentin (NEURONTIN) 100 MG capsule Take 1 capsule (100 mg total) by mouth 2 (two) times daily. 04/19/20  Yes Tower, Wynelle Fanny, MD  loratadine (CLARITIN) 10 MG tablet Take 10 mg by mouth daily. 24 hours   Yes [provider]  pantoprazole (PROTONIX) 40 MG tablet TAKE 1 TABLET BY MOUTH TWICE A DAY 06/19/19  Yes Tower, Wynelle Fanny, MD  benzonatate (TESSALON) 200 MG capsule Take 1 capsule (200 mg total) by mouth 3 (three) times daily as needed for up to 7 days for cough. 05/30/20 06/06/20  Gustavus Haskin C, PA-C  glycopyrrolate (ROBINUL) 2 MG tablet TAKE 1 TABLET (2 MG TOTAL) BY MOUTH 2 (TWO) TIMES DAILY. AS NEEDED FOR ABDOMINAL PAIN 06/19/19   Ladene Artist, MD  hyoscyamine (LEVSIN) 0.125 MG/ML solution Take 0.125 mg by mouth every 6 (six) hours as needed.    [provider]  SUMAtriptan (IMITREX) 100 MG tablet Take 1 tablet (100 mg total) by mouth once for 1 dose. May repeat in 2 hours if headache persists or recurs. 04/19/20 04/19/20  Tower, Wynelle Fanny, MD    Family History Family History  Problem Relation Age of Onset   Asthma Mother    Hypertension Father    Heart  disease Paternal Grandmother    Diabetes Paternal Grandmother    Lactose intolerance Daughter    Bone cancer Paternal Grandfather    Colon cancer Neg Hx    Esophageal cancer Neg Hx    Pancreatic cancer Neg Hx    Stomach cancer Neg Hx    Kidney disease Neg Hx    Liver disease Neg Hx     Social History Social History   Tobacco Use   Smoking status: Current Every Day Smoker    Packs/day: 1.00    Years: 15.00    Pack years: 15.00    Types: Cigarettes   Smokeless tobacco: Former Systems developer    Types: Snuff    Quit date: 10/20/2011   Tobacco comment: form given 12-09-15  Vaping Use   Vaping Use: Former  Substance Use Topics   Alcohol use: Not  Currently    Alcohol/week: 0.0 standard drinks   Drug use: No     Allergies   Chantix [varenicline] and Betadine [povidone iodine]   Review of Systems Review of Systems  Constitutional: Negative for activity change, appetite change, chills, fatigue and fever.  HENT: Positive for sore throat. Negative for congestion, ear pain, rhinorrhea, sinus pressure and trouble swallowing.   Eyes: Negative for discharge and redness.  Respiratory: Positive for cough. Negative for chest tightness and shortness of breath.   Cardiovascular: Negative for chest pain.  Gastrointestinal: Negative for abdominal pain, diarrhea, nausea and vomiting.  Musculoskeletal: Negative for myalgias.  Skin: Negative for rash.  Neurological: Negative for dizziness, light-headedness and headaches.     Physical Exam Triage Vital Signs ED Triage Vitals  Enc Vitals Group     BP 05/30/20 1841 (!) 137/92     Pulse Rate 05/30/20 1841 76     Resp 05/30/20 1841 20     Temp 05/30/20 1841 98.7 F (37.1 C)     Temp Source 05/30/20 1841 Oral     SpO2 05/30/20 1841 100 %     Weight --      Height --      Head Circumference --      Peak Flow --      Pain Score 05/30/20 1838 8     Pain Loc --      Pain Edu? --      Excl. in [place]? --    No data found.  Updated Vital Signs BP (!) 137/92 (BP Location: Right Arm)    Pulse 76    Temp 98.7 F (37.1 C) (Oral)    Resp 20    SpO2 100%   Visual Acuity Right Eye Distance:   Left Eye Distance:   Bilateral Distance:    Right Eye Near:   Left Eye Near:    Bilateral Near:     Physical Exam Vitals and nursing note reviewed.  Constitutional:      Appearance: He is well-developed.     Comments: No acute distress  HENT:     Head: Normocephalic and atraumatic.     Ears:     Comments: Bilateral ears without tenderness to palpation of external auricle, tragus and mastoid, EAC's without erythema or swelling, TM's with good bony landmarks and cone of light. Non  erythematous.     Nose: Nose normal.     Mouth/Throat:     Comments: Oral mucosa pink and moist, no tonsillar enlargement or exudate. Posterior pharynx patent and nonerythematous, no uvula deviation or swelling. Normal phonation.  Eyes:     Conjunctiva/sclera: Conjunctivae normal.  Cardiovascular:     Rate and Rhythm: Normal rate.  Pulmonary:     Effort: Pulmonary effort is normal. No respiratory distress.     Comments: Breathing comfortably at rest, CTABL, no wheezing, rales or other adventitious sounds auscultated  Abdominal:     General: There is no distension.     Tenderness: There is no left CVA tenderness.  Musculoskeletal:        General: Normal range of motion.     Cervical back: Neck supple.  Skin:    General: Skin is warm and dry.  Neurological:     Mental Status: He is alert and oriented to person, place, and time.      UC Treatments / Results  Labs (all labs ordered are listed, but only abnormal results are displayed) Labs Reviewed  SARS CORONAVIRUS 2 (TAT 6-24 HRS)    EKG   Radiology No results found.  Procedures Procedures (including critical care time)  Medications Ordered in UC Medications  alum & mag hydroxide-simeth (MAALOX/MYLANTA) 200-200-20 MG/5ML suspension 30 mL (30 mLs Oral Given 05/30/20 1909)    And  lidocaine (XYLOCAINE) 2 % viscous mouth solution 15 mL (15 mLs Oral Given 05/30/20 1908)    Initial Impression / Assessment and Plan / UC Course  I have reviewed the triage vital signs and the nursing notes.  Pertinent labs & imaging results that were available during my care of the patient were reviewed by me and considered in my medical decision making (see chart for details).     URI symptoms beginning today, Covid test pending.  Lungs clear, vital signs stable, suspect likely viral etiology and recommend symptomatic and supportive care.  Rest and fluids.  Continue to monitor, Discussed strict return precautions. Patient verbalized  understanding and is agreeable with plan.  Final Clinical Impressions(s) / UC Diagnoses   Final diagnoses:  Viral URI with cough  Gastroesophageal reflux disease, unspecified whether esophagitis present     Discharge Instructions     Covid test pending, monitor my chart for results Rest and fluids Tessalon/benzonatate every 8 hours for cough Begin Mucinex, also continue daily allergy pill Honey and hot tea for any sore throat Tylenol and ibuprofen for body aches, headache and any fevers Follow-up for any concerns, worsening or persistent symptoms   ED Prescriptions    Medication Sig Dispense Auth. Provider   benzonatate (TESSALON) 200 MG capsule Take 1 capsule (200 mg total) by mouth 3 (three) times daily as needed for up to 7 days for cough. 28 capsule Ayoub Arey, [place] C, PA-C     PDMP not reviewed this encounter.   Janith Lima, PA-C 06/01/20 1250

## 2020-07-01 DIAGNOSIS — Z20828 Contact with and (suspected) exposure to other viral communicable diseases: Secondary | ICD-10-CM | POA: Diagnosis not present

## 2020-08-05 ENCOUNTER — Emergency Department (HOSPITAL_COMMUNITY)
Admission: EM | Admit: 2020-08-05 | Discharge: 2020-08-06 | Disposition: A | Discharge status: Home / Self Care | Payer: BC Managed Care – PPO | Attending: Emergency Medicine | Admitting: Emergency Medicine

## 2020-08-05 ENCOUNTER — Ambulatory Visit (INDEPENDENT_AMBULATORY_CARE_PROVIDER_SITE_OTHER): Payer: BC Managed Care – PPO | Admitting: Family Medicine

## 2020-08-05 ENCOUNTER — Encounter: Payer: Self-pay | Admitting: Family Medicine

## 2020-08-05 ENCOUNTER — Encounter (HOSPITAL_COMMUNITY): Payer: Self-pay | Admitting: Emergency Medicine

## 2020-08-05 ENCOUNTER — Other Ambulatory Visit: Payer: Self-pay

## 2020-08-05 VITALS — BP 128/86 | HR 103 | Temp 97.6°F | Ht 68.5 in | Wt 183.1 lb

## 2020-08-05 DIAGNOSIS — F17210 Nicotine dependence, cigarettes, uncomplicated: Secondary | ICD-10-CM | POA: Diagnosis not present

## 2020-08-05 DIAGNOSIS — R109 Unspecified abdominal pain: Secondary | ICD-10-CM | POA: Diagnosis not present

## 2020-08-05 DIAGNOSIS — R1031 Right lower quadrant pain: Secondary | ICD-10-CM | POA: Diagnosis not present

## 2020-08-05 DIAGNOSIS — K219 Gastro-esophageal reflux disease without esophagitis: Secondary | ICD-10-CM | POA: Diagnosis not present

## 2020-08-05 LAB — CBC
HCT: 50.4 % (ref 39.0–52.0)
Hemoglobin: 17.4 g/dL — ABNORMAL HIGH (ref 13.0–17.0)
MCH: 30.5 pg (ref 26.0–34.0)
MCHC: 34.5 g/dL (ref 30.0–36.0)
MCV: 88.3 fL (ref 80.0–100.0)
Platelets: 204 10*3/uL (ref 150–400)
RBC: 5.71 MIL/uL (ref 4.22–5.81)
RDW: 12.4 % (ref 11.5–15.5)
WBC: 11.6 10*3/uL — ABNORMAL HIGH (ref 4.0–10.5)
nRBC: 0 % (ref 0.0–0.2)

## 2020-08-05 LAB — URINALYSIS, ROUTINE W REFLEX MICROSCOPIC
Bilirubin Urine: NEGATIVE
Glucose, UA: NEGATIVE mg/dL
Hgb urine dipstick: NEGATIVE
Ketones, ur: NEGATIVE mg/dL
Leukocytes,Ua: NEGATIVE
Nitrite: NEGATIVE
Protein, ur: NEGATIVE mg/dL
Specific Gravity, Urine: 1.012 (ref 1.005–1.030)
pH: 5 (ref 5.0–8.0)

## 2020-08-05 LAB — POC URINALSYSI DIPSTICK (AUTOMATED)
Bilirubin, UA: NEGATIVE
Blood, UA: NEGATIVE
Glucose, UA: NEGATIVE
Ketones, UA: NEGATIVE
Leukocytes, UA: NEGATIVE
Nitrite, UA: NEGATIVE
Protein, UA: NEGATIVE
Spec Grav, UA: 1.015 (ref 1.010–1.025)
Urobilinogen, UA: 0.2 E.U./dL
pH, UA: 6 (ref 5.0–8.0)

## 2020-08-05 LAB — COMPREHENSIVE METABOLIC PANEL
ALT: 27 U/L (ref 0–44)
AST: 22 U/L (ref 15–41)
Albumin: 4.4 g/dL (ref 3.5–5.0)
Alkaline Phosphatase: 72 U/L (ref 38–126)
Anion gap: 10 (ref 5–15)
BUN: 8 mg/dL (ref 6–20)
CO2: 23 mmol/L (ref 22–32)
Calcium: 9.3 mg/dL (ref 8.9–10.3)
Chloride: 106 mmol/L (ref 98–111)
Creatinine, Ser: 0.92 mg/dL (ref 0.61–1.24)
GFR, Estimated: >60 mL/min (ref >60)
Glucose, Bld: 101 mg/dL — ABNORMAL HIGH (ref 70–99)
Potassium: 4.6 mmol/L (ref 3.5–5.1)
Sodium: 139 mmol/L (ref 135–145)
Total Bilirubin: 0.5 mg/dL (ref 0.3–1.2)
Total Protein: 6.8 g/dL (ref 6.5–8.1)

## 2020-08-05 LAB — LIPASE, BLOOD: Lipase: 26 U/L (ref 11–51)

## 2020-08-05 MED ORDER — OXYCODONE-ACETAMINOPHEN 5-325 MG PO TABS
1.0000 | ORAL_TABLET | Freq: Once | ORAL | Status: AC
  Administered 2020-08-05: 1 via ORAL
  Filled 2020-08-05: qty 1

## 2020-08-05 NOTE — Assessment & Plan Note (Signed)
ua neg  In conjunction with RLQ abd pain  Hurts with movement and some gaurding  Worrisome for appendicitis or other process -sent to [facility] for eval

## 2020-08-05 NOTE — Patient Instructions (Signed)
Go ahead and head to [facility] emergency room   If you do not feel you can drive- call [phone]  Do not eat or drink anything   Urine is clear

## 2020-08-05 NOTE — Progress Notes (Signed)
Subjective:    Patient ID: Donald Merritt, male    DOB: 06-16-1986, 34 y.o.   MRN: [id number]  This visit occurred during the SARS-CoV-2 public health emergency.  Safety protocols were in place, including screening questions prior to the visit, additional usage of staff PPE, and extensive cleaning of exam room while observing appropriate contact time as indicated for disinfecting solutions.    HPI Pt presents for f/u of abdominal pain   Wt Readings from Last 3 Encounters:  08/05/20 183 lb 1.6 oz (83.1 kg)  04/19/20 176 lb (79.8 kg)  04/05/20 177 lb 6 oz (80.5 kg)   27.44 kg/m  Started last Thursday - noticed ibs worse than usual  Did not eat anything out of the ordinary  Diarrhea since Saturday  Urgent stools sometimes green in color  Nausea   Pain is in lower abdomen and on the R side -then moved to L side  Is tender to the touch   He has to run to the bathroom after he eats  No blood in stool   No urinary symptoms  No blood in urine  He has had kidney stone in the past - this does not feel like a kidney stone   ua today Results for orders placed or performed in visit on 08/05/20  POCT Urinalysis Dipstick (Automated)  Result Value Ref Range   Color, UA yellow    Clarity, UA clear    Glucose, UA Negative Negative   Bilirubin, UA Neg    Ketones, UA Neg    Spec Grav, UA 1.015 1.010 - 1.025   Blood, UA Neg    pH, UA 6.0 5.0 - 8.0   Protein, UA Negative Negative   Urobilinogen, UA 0.2 0.2 or 1.0 E.U./dL   Nitrite, UA Neg    Leukocytes, UA Negative Negative     Nothing makes pain worse or better  Throbs and burns   No rash at all   Current smoker   covid status   Hx of GERD Takes protonix  No heartburn or acid symptoms   H/o IBS Robinul and levsin are on med list   He had colonoscopy and EGD in 2019 Small polyp -colon  Gastritis? - EGD  Biopsies were ok   Last CT 02/2019 CT Renal Laren Everts (Accession [id number]) (Order  [id number]) Imaging Date: 03/06/2019 Department: [facility] Released By/Authorizing: Okey Regal, PA-C (auto-released)  Exam Status  Status  Final [99]  PACS Intelerad Image Link  Show images for CT Renal Stone Study Study Result  Narrative & Impression  CLINICAL DATA:  Right flank pain  EXAM: CT ABDOMEN AND PELVIS WITHOUT CONTRAST  TECHNIQUE: Multidetector CT imaging of the abdomen and pelvis was performed following the standard protocol without oral or IV contrast.  COMPARISON:  June 03, 2018  FINDINGS: Lower chest: There is slight atelectatic change in the posterior left base. There is no lung base edema or consolidation.  Hepatobiliary: No focal liver lesions are evident on this noncontrast enhanced study. Gallbladder wall is not appreciably thickened. There is no biliary duct dilatation.  Pancreas: There is no pancreatic mass or inflammatory focus.  Spleen: No splenic lesions are apparent.  Adrenals/Urinary Tract: Adrenals bilaterally appear unremarkable. Kidneys bilaterally show no evident mass or hydronephrosis on either side. There is no appreciable renal or ureteral calculus on either side. The urinary bladder is midline. The urinary bladder wall thickness is felt to be within normal limits for degree of bladder  distention.  Stomach/Bowel: There are occasional sigmoid diverticula without diverticulitis. There is no appreciable bowel wall or mesenteric thickening. There is no evident bowel obstruction. Terminal ileum appears unremarkable. There is no free air or portal venous air.  Vascular/Lymphatic: There is no abdominal aortic aneurysm. No vascular lesions are evident on this noncontrast enhanced study. There is no appreciable adenopathy in the abdomen or pelvis.  Reproductive: There are prostatic calculi present. Prostate and seminal vesicles are normal in size and contour. There is no  evident pelvic mass.  Other: Appendix appears normal. There is no abscess or ascites in the abdomen or pelvis. There is a rather minimal ventral hernia containing only fat.  Musculoskeletal: There are no blastic or lytic bone lesions. There is no intramuscular lesion.  IMPRESSION: 1. No evident renal or ureteral calculus. No hydronephrosis on either side. There are prostatic calculi. Urinary bladder wall thickness is felt to be within normal limits for degree of bladder distention.  2. Occasional sigmoid diverticula without diverticulitis. No bowel obstruction. No abscess in the abdomen pelvis. Appendix appears unremarkable.  3.  Minimal ventral hernia containing only fat.   Electronically Signed   By: Lowella Grip III M.D.   On: 03/06/2019 11:03   Patient Active Problem List   Diagnosis Date Noted  . Abdominal pain 08/05/2020  . Routine general medical examination at a health care facility 04/19/2020  . Cerumen impaction 03/25/2020  . Chest wall pain 04/17/2019  . Chronic headaches 04/17/2019  . Stress reaction 04/17/2019  . IBS (irritable bowel syndrome) 01/04/2017  . Smoking 07/22/2015  . Hypertriglyceridemia 03/13/2014  . Screening for lipoid disorders 03/05/2014  . Right flank pain 11/14/2013  . GERD (gastroesophageal reflux disease) 09/04/2011  . History of kidney stones 03/12/2010  . MICROSCOPIC HEMATURIA 03/12/2010  . [place], PERSISTENT 03/15/2008  . Allergic rhinitis 02/13/2008  . DEPRESSION 04/27/2007   Past Medical History:  Diagnosis Date  . Allergy   . Depression   . Environmental allergies   . GERD (gastroesophageal reflux disease)   . Other and unspecified noninfectious gastroenteritis and colitis(558.9)   . Other malaise and fatigue   . Panic attack   . Renal stones   . Tobacco use disorder   . Unspecified sleep apnea    Past Surgical History:  Procedure Laterality Date  . neg hx     Social History   Tobacco Use  .  Smoking status: Current Every Day Smoker    Packs/day: 1.00    Years: 15.00    Pack years: 15.00    Types: Cigarettes  . Smokeless tobacco: Former Systems developer    Types: Snuff    Quit date: 10/20/2011  . Tobacco comment: form given 12-09-15  Vaping Use  . Vaping Use: Former  Substance Use Topics  . Alcohol use: Not Currently    Alcohol/week: 0.0 standard drinks  . Drug use: No   Family History  Problem Relation Age of Onset  . Asthma Mother   . Hypertension Father   . Heart disease Paternal Grandmother   . Diabetes Paternal Grandmother   . Lactose intolerance Daughter   . Bone cancer Paternal Grandfather   . Colon cancer Neg Hx   . Esophageal cancer Neg Hx   . Pancreatic cancer Neg Hx   . Stomach cancer Neg Hx   . Kidney disease Neg Hx   . Liver disease Neg Hx    Allergies  Allergen Reactions  . Chantix [Varenicline] Shortness Of Breath  . Betadine [Povidone Iodine]  Rash   Current Outpatient Medications on File Prior to Visit  Medication Sig Dispense Refill  . acetaminophen (TYLENOL) 325 MG tablet Take 650 mg by mouth every 6 (six) hours as needed.    . fluticasone (FLONASE) 50 MCG/ACT nasal spray Place 1 spray into both nostrils daily. 16 g 0  . gabapentin (NEURONTIN) 100 MG capsule Take 1 capsule (100 mg total) by mouth 2 (two) times daily. 60 capsule 3  . glycopyrrolate (ROBINUL) 2 MG tablet TAKE 1 TABLET (2 MG TOTAL) BY MOUTH 2 (TWO) TIMES DAILY. AS NEEDED FOR ABDOMINAL PAIN 180 tablet 0  . hyoscyamine (LEVSIN) 0.125 MG/ML solution Take 0.125 mg by mouth every 6 (six) hours as needed.    . loratadine (CLARITIN) 10 MG tablet Take 10 mg by mouth daily. 24 hours    . pantoprazole (PROTONIX) 40 MG tablet TAKE 1 TABLET BY MOUTH TWICE A DAY 180 tablet 1  . SUMAtriptan (IMITREX) 100 MG tablet Take 1 tablet (100 mg total) by mouth once for 1 dose. May repeat in 2 hours if headache persists or recurs. 10 tablet 11   No current facility-administered medications on file prior to visit.      Review of Systems  Constitutional: Negative for activity change, appetite change, fatigue, fever and unexpected weight change.  HENT: Negative for congestion, rhinorrhea, sore throat and trouble swallowing.   Eyes: Negative for pain, redness, itching and visual disturbance.  Respiratory: Negative for cough, chest tightness, shortness of breath and wheezing.   Cardiovascular: Negative for chest pain and palpitations.  Gastrointestinal: Positive for abdominal pain and nausea. Negative for abdominal distention, blood in stool, constipation, diarrhea and vomiting.  Endocrine: Negative for cold intolerance, heat intolerance, polydipsia and polyuria.  Genitourinary: Negative for difficulty urinating, dysuria, frequency and urgency.  Musculoskeletal: Negative for arthralgias, joint swelling and myalgias.  Skin: Negative for pallor and rash.  Neurological: Negative for dizziness, tremors, weakness, numbness and headaches.  Hematological: Negative for adenopathy. Does not bruise/bleed easily.  Psychiatric/Behavioral: Negative for decreased concentration and dysphoric mood. The patient is not nervous/anxious.        Objective:   Physical Exam Constitutional:      General: He is in acute distress.     Appearance: He is well-developed and normal weight. He is not ill-appearing or diaphoretic.     Comments: In some distress with abdominal pain   HENT:     Mouth/Throat:     Mouth: Mucous membranes are moist.  Eyes:     General: No scleral icterus. Cardiovascular:     Rate and Rhythm: Normal rate and regular rhythm.     Heart sounds: Normal heart sounds.  Pulmonary:     Effort: Pulmonary effort is normal. No respiratory distress.     Breath sounds: Normal breath sounds. No wheezing or rales.  Abdominal:     General: Abdomen is flat. Bowel sounds are normal. There is no distension or abdominal bruit. There are no signs of injury.     Palpations: Abdomen is soft. There is no hepatomegaly,  splenomegaly, mass or pulsatile mass.     Tenderness: There is abdominal tenderness in the right lower quadrant and periumbilical area. There is right CVA tenderness and guarding. There is no left CVA tenderness or rebound. Positive signs include McBurney's sign.     Hernia: No hernia is present.  Skin:    General: Skin is warm and dry.     Coloration: Skin is not jaundiced or pale.  Findings: No rash.  Neurological:     Mental Status: He is alert.  Psychiatric:        Mood and Affect: Mood normal.           Assessment & Plan:   Problem List Items Addressed This Visit      Other   Right flank pain    ua neg  In conjunction with RLQ abd pain  Hurts with movement and some gaurding  Worrisome for appendicitis or other process -sent to [facility] for eval      Relevant Orders   POCT Urinalysis Dipstick (Automated) (Completed)   Abdominal pain - Primary    ua is neg  Pain in RLQ and palpation in other areas sends pain there  Pain with movement/walking and shaking the table Mild guarding but no rebound tenderness Pain is growing worse/some nausea  Needs urgent eval at this late hour with imaging/lab to r/o appendicitis or other emergency Sent to [facility] ER now and triage was contacted  He will call [phone] if pain becomes too severe to drive

## 2020-08-05 NOTE — ED Triage Notes (Signed)
Pt. Stated, I went to [facility] in  [place] and told to come her for  Scan. The pain started on Thursday.

## 2020-08-05 NOTE — Assessment & Plan Note (Signed)
ua is neg  Pain in RLQ and palpation in other areas sends pain there  Pain with movement/walking and shaking the table Mild guarding but no rebound tenderness Pain is growing worse/some nausea  Needs urgent eval at this late hour with imaging/lab to r/o appendicitis or other emergency Sent to [facility] ER now and triage was contacted  He will call 911 if pain becomes too severe to drive

## 2020-08-06 ENCOUNTER — Emergency Department (HOSPITAL_COMMUNITY): Payer: BC Managed Care – PPO

## 2020-08-06 DIAGNOSIS — R109 Unspecified abdominal pain: Secondary | ICD-10-CM | POA: Diagnosis not present

## 2020-08-06 MED ORDER — IOHEXOL 300 MG/ML  SOLN
100.0000 mL | Freq: Once | INTRAMUSCULAR | Status: AC | PRN
  Administered 2020-08-06: 100 mL via INTRAVENOUS

## 2020-08-06 MED ORDER — OXYCODONE-ACETAMINOPHEN 5-325 MG PO TABS
2.0000 | ORAL_TABLET | Freq: Once | ORAL | Status: AC
  Administered 2020-08-06: 2 via ORAL
  Filled 2020-08-06: qty 2

## 2020-08-06 NOTE — ED Provider Notes (Signed)
[facility] EMERGENCY DEPARTMENT Provider Note   CSN: [id number] Arrival date & time: 08/05/20  1723     History Chief Complaint  Patient presents with  . Abdominal Pain  . Flank Pain    Donald Merritt is a 34 y.o. male.  The history is provided by the patient and medical records.  Abdominal Pain Flank Pain Associated symptoms include abdominal pain.    34 year old male with history of seasonal allergies, depression, GERD, history of kidney stones, presenting to the ED with right lower abdominal pain.  States this began last Thursday and has been ongoing since that time.  States it is a dull, intense pain with radiation to his back.  Is not had any change in urination or bowel habits.  No fever.  Pain is worse with movement and palpation.  States he has not really had much of an appetite due to the pain but pain is not necessarily worse after eating.  He went and saw his PCP this morning who was worried about appendicitis and sent him directly to the ED.  He did have some pain medication in triage several hours ago but pain has since recurred.  Past Medical History:  Diagnosis Date  . Allergy   . Depression   . Environmental allergies   . GERD (gastroesophageal reflux disease)   . Other and unspecified noninfectious gastroenteritis and colitis(558.9)   . Other malaise and fatigue   . Panic attack   . Renal stones   . Tobacco use disorder   . Unspecified sleep apnea     Patient Active Problem List   Diagnosis Date Noted  . Abdominal pain 08/05/2020  . Routine general medical examination at a health care facility 04/19/2020  . Cerumen impaction 03/25/2020  . Chest wall pain 04/17/2019  . Chronic headaches 04/17/2019  . Stress reaction 04/17/2019  . IBS (irritable bowel syndrome) 01/04/2017  . Smoking 07/22/2015  . Hypertriglyceridemia 03/13/2014  . Screening for lipoid disorders 03/05/2014  . Right flank pain 11/14/2013  . GERD (gastroesophageal  reflux disease) 09/04/2011  . History of kidney stones 03/12/2010  . MICROSCOPIC HEMATURIA 03/12/2010  . [place], PERSISTENT 03/15/2008  . Allergic rhinitis 02/13/2008  . DEPRESSION 04/27/2007    Past Surgical History:  Procedure Laterality Date  . neg hx         Family History  Problem Relation Age of Onset  . Asthma Mother   . Hypertension Father   . Heart disease Paternal Grandmother   . Diabetes Paternal Grandmother   . Lactose intolerance Daughter   . Bone cancer Paternal Grandfather   . Colon cancer Neg Hx   . Esophageal cancer Neg Hx   . Pancreatic cancer Neg Hx   . Stomach cancer Neg Hx   . Kidney disease Neg Hx   . Liver disease Neg Hx     Social History   Tobacco Use  . Smoking status: Current Every Day Smoker    Packs/day: 1.00    Years: 15.00    Pack years: 15.00    Types: Cigarettes  . Smokeless tobacco: Former Systems developer    Types: Snuff    Quit date: 10/20/2011  . Tobacco comment: form given 12-09-15  Vaping Use  . Vaping Use: Former  Substance Use Topics  . Alcohol use: Not Currently    Alcohol/week: 0.0 standard drinks  . Drug use: No    Home Medications Prior to Admission medications   Medication Sig Start Date End Date Taking?  Authorizing Provider  acetaminophen (TYLENOL) 325 MG tablet Take 650 mg by mouth every 6 (six) hours as needed.    [provider]  fluticasone (FLONASE) 50 MCG/ACT nasal spray Place 1 spray into both nostrils daily. 11/27/17   Caccavale, Sophia, PA-C  gabapentin (NEURONTIN) 100 MG capsule Take 1 capsule (100 mg total) by mouth 2 (two) times daily. 04/19/20   Tower, Wynelle Fanny, MD  glycopyrrolate (ROBINUL) 2 MG tablet TAKE 1 TABLET (2 MG TOTAL) BY MOUTH 2 (TWO) TIMES DAILY. AS NEEDED FOR ABDOMINAL PAIN 06/19/19   Ladene Artist, MD  hyoscyamine (LEVSIN) 0.125 MG/ML solution Take 0.125 mg by mouth every 6 (six) hours as needed.    [provider]  loratadine (CLARITIN) 10 MG tablet Take 10 mg by mouth daily. 24  hours    [provider]  pantoprazole (PROTONIX) 40 MG tablet TAKE 1 TABLET BY MOUTH TWICE A DAY 06/19/19   Tower, Wynelle Fanny, MD  SUMAtriptan (IMITREX) 100 MG tablet Take 1 tablet (100 mg total) by mouth once for 1 dose. May repeat in 2 hours if headache persists or recurs. 04/19/20 04/19/20  Tower, Wynelle Fanny, MD    Allergies    Chantix [varenicline] and Betadine [povidone iodine]  Review of Systems   Review of Systems  Gastrointestinal: Positive for abdominal pain.  Genitourinary: Positive for flank pain.  All other systems reviewed and are negative.   Physical Exam Updated Vital Signs BP 127/83 (BP Location: Right Arm)   Pulse 70   Temp 98.3 F (36.8 C) (Oral)   Resp 17   Ht 5\' 8"  (1.727 m)   Wt 83.1 kg   SpO2 99%   BMI 27.86 kg/m   Physical Exam Vitals and nursing note reviewed.  Constitutional:      Appearance: He is well-developed.  HENT:     Head: Normocephalic and atraumatic.  Eyes:     Conjunctiva/sclera: Conjunctivae normal.     Pupils: Pupils are equal, round, and reactive to light.  Cardiovascular:     Rate and Rhythm: Normal rate and regular rhythm.     Heart sounds: Normal heart sounds.  Pulmonary:     Effort: Pulmonary effort is normal.     Breath sounds: Normal breath sounds.  Abdominal:     General: Bowel sounds are normal.     Palpations: Abdomen is soft.     Tenderness: There is abdominal tenderness. There is no rebound. Negative signs include Murphy's sign.       Comments: Tender as depicted, no peritoneal signs, no rash or other skin abnormality noted  Musculoskeletal:        General: Normal range of motion.     Cervical back: Normal range of motion.  Skin:    General: Skin is warm and dry.  Neurological:     Mental Status: He is alert and oriented to person, place, and time.     ED Results / Procedures / Treatments   Labs (all labs ordered are listed, but only abnormal results are displayed) Labs Reviewed  COMPREHENSIVE METABOLIC  PANEL - Abnormal; Notable for the following components:      Result Value   Glucose, Bld 101 (*)    All other components within normal limits  CBC - Abnormal; Notable for the following components:   WBC 11.6 (*)    Hemoglobin 17.4 (*)    All other components within normal limits  LIPASE, BLOOD  URINALYSIS, ROUTINE W REFLEX MICROSCOPIC    EKG None  Radiology CT Abdomen Pelvis W Contrast  Result Date: 08/06/2020 CLINICAL DATA:  Abdominal pain for 5 days with abscess/infection suspected EXAM: CT ABDOMEN AND PELVIS WITH CONTRAST TECHNIQUE: Multidetector CT imaging of the abdomen and pelvis was performed using the standard protocol following bolus administration of intravenous contrast. CONTRAST:  156mL OMNIPAQUE IOHEXOL 300 MG/ML  SOLN COMPARISON:  03/06/2019 FINDINGS: Lower chest:  No contributory findings. Hepatobiliary: No focal liver abnormality.No evidence of biliary obstruction or stone. Pancreas: Unremarkable including a stable lobulation ventrally underneath the pylorus. Spleen: Unremarkable. Adrenals/Urinary Tract: Negative adrenals. No hydronephrosis or stone. Unremarkable bladder. Stomach/Bowel:  No obstruction. No appendicitis. Vascular/Lymphatic: No acute vascular abnormality. No mass or adenopathy. Reproductive:No pathologic findings. Other: No ascites or pneumoperitoneum. Musculoskeletal: No acute abnormalities.  L3 limbus. IMPRESSION: No explanation for abdominal pain. Electronically Signed   By: Monte Fantasia M.D.   On: 08/06/2020 04:27    Procedures Procedures (including critical care time)  Medications Ordered in ED Medications  oxyCODONE-acetaminophen (PERCOCET/ROXICET) 5-325 MG per tablet 1 tablet (1 tablet Oral Given 08/05/20 1752)  iohexol (OMNIPAQUE) 300 MG/ML solution 100 mL (100 mLs Intravenous Contrast Given 08/06/20 0403)  oxyCODONE-acetaminophen (PERCOCET/ROXICET) 5-325 MG per tablet 2 tablet (2 tablets Oral Given 08/06/20 2878)    ED Course  I have  reviewed the triage vital signs and the nursing notes.  Pertinent labs & imaging results that were available during my care of the patient were reviewed by me and considered in my medical decision making (see chart for details).    MDM Rules/Calculators/A&P  34 year old male presenting to the ED with right lower abdominal pain that began on Thursday. Saw PCP earlier today and sent here for appendicitis rule out. He is afebrile and nontoxic in appearance here. Labs are grossly reassuring. CT was obtained from triage and is negative for any acute findings, specifically normal appendix. He does not have any overlying skin abnormalities or rash concerning for shingles. He was given pain medication here, able to eat and drink without difficulty. Feel he is stable for discharge home with negative work-up. Continue symptomatic care, close follow-up with PCP. Return here for any new or acute changes.  Final Clinical Impression(s) / ED Diagnoses Final diagnoses:  Right lower quadrant abdominal pain    Rx / DC Orders ED Discharge Orders    None       Larene Pickett, PA-C 08/06/20 6767    Varney Biles, MD 08/07/20 0127

## 2020-08-06 NOTE — Discharge Instructions (Signed)
CT today did not show any acute findings, specifically normal appendix. Continue symptomatic care at home. Close follow-up with your primary care doctor. Return here for any new/acute changes.

## 2020-08-21 DIAGNOSIS — Z1152 Encounter for screening for COVID-19: Secondary | ICD-10-CM | POA: Diagnosis not present

## 2020-08-21 DIAGNOSIS — B349 Viral infection, unspecified: Secondary | ICD-10-CM | POA: Diagnosis not present

## 2020-08-29 ENCOUNTER — Other Ambulatory Visit: Payer: Self-pay | Admitting: Gastroenterology

## 2020-08-30 ENCOUNTER — Other Ambulatory Visit: Payer: Self-pay | Admitting: Family Medicine

## 2020-10-09 ENCOUNTER — Other Ambulatory Visit: Payer: Self-pay

## 2020-10-09 ENCOUNTER — Encounter: Payer: Self-pay | Admitting: Family Medicine

## 2020-10-09 ENCOUNTER — Ambulatory Visit (INDEPENDENT_AMBULATORY_CARE_PROVIDER_SITE_OTHER): Payer: BC Managed Care – PPO | Admitting: Family Medicine

## 2020-10-09 VITALS — BP 122/78 | HR 78 | Temp 97.5°F | Ht 68.5 in | Wt 196.5 lb

## 2020-10-09 DIAGNOSIS — K582 Mixed irritable bowel syndrome: Secondary | ICD-10-CM

## 2020-10-09 DIAGNOSIS — G8929 Other chronic pain: Secondary | ICD-10-CM

## 2020-10-09 DIAGNOSIS — K219 Gastro-esophageal reflux disease without esophagitis: Secondary | ICD-10-CM | POA: Diagnosis not present

## 2020-10-09 DIAGNOSIS — R519 Headache, unspecified: Secondary | ICD-10-CM | POA: Diagnosis not present

## 2020-10-09 DIAGNOSIS — Z23 Encounter for immunization: Secondary | ICD-10-CM | POA: Diagnosis not present

## 2020-10-09 DIAGNOSIS — R1011 Right upper quadrant pain: Secondary | ICD-10-CM | POA: Diagnosis not present

## 2020-10-09 LAB — CBC WITH DIFFERENTIAL/PLATELET
Basophils Absolute: 0.1 10*3/uL (ref 0.0–0.1)
Basophils Relative: 0.7 % (ref 0.0–3.0)
Eosinophils Absolute: 0.3 10*3/uL (ref 0.0–0.7)
Eosinophils Relative: 3.2 % (ref 0.0–5.0)
HCT: 48.5 % (ref 39.0–52.0)
Hemoglobin: 16.6 g/dL (ref 13.0–17.0)
Lymphocytes Relative: 20.3 % (ref 12.0–46.0)
Lymphs Abs: 1.8 10*3/uL (ref 0.7–4.0)
MCHC: 34.2 g/dL (ref 30.0–36.0)
MCV: 86.9 fl (ref 78.0–100.0)
Monocytes Absolute: 0.6 10*3/uL (ref 0.1–1.0)
Monocytes Relative: 6.3 % (ref 3.0–12.0)
Neutro Abs: 6.2 10*3/uL (ref 1.4–7.7)
Neutrophils Relative %: 69.5 % (ref 43.0–77.0)
Platelets: 203 10*3/uL (ref 150.0–400.0)
RBC: 5.58 Mil/uL (ref 4.22–5.81)
RDW: 12.5 % (ref 11.5–15.5)
WBC: 9 10*3/uL (ref 4.0–10.5)

## 2020-10-09 LAB — HEPATIC FUNCTION PANEL
ALT: 44 U/L (ref 0–53)
AST: 25 U/L (ref 0–37)
Albumin: 4.7 g/dL (ref 3.5–5.2)
Alkaline Phosphatase: 82 U/L (ref 39–117)
Bilirubin, Direct: 0.1 mg/dL (ref 0.0–0.3)
Total Bilirubin: 0.4 mg/dL (ref 0.2–1.2)
Total Protein: 7.4 g/dL (ref 6.0–8.3)

## 2020-10-09 LAB — BASIC METABOLIC PANEL
BUN: 14 mg/dL (ref 6–23)
CO2: 31 mEq/L (ref 19–32)
Calcium: 9.9 mg/dL (ref 8.4–10.5)
Chloride: 102 mEq/L (ref 96–112)
Creatinine, Ser: 1.06 mg/dL (ref 0.40–1.50)
GFR: 90.76 mL/min (ref >60.00)
Glucose, Bld: 86 mg/dL (ref 70–99)
Potassium: 4.5 mEq/L (ref 3.5–5.1)
Sodium: 138 mEq/L (ref 135–145)

## 2020-10-09 LAB — LIPASE: Lipase: 16 U/L (ref 11.0–59.0)

## 2020-10-09 MED ORDER — GABAPENTIN 300 MG PO CAPS: 300.0000 mg | ORAL_CAPSULE | Freq: Two times a day (BID) | ORAL | 3 refills | Status: DC

## 2020-10-09 NOTE — Assessment & Plan Note (Signed)
More diarrhea than constipation lately  No particular food triggers

## 2020-10-09 NOTE — Assessment & Plan Note (Signed)
With hx of GERD and IBS also  Now more focal discomfort  With nausea /occ vomiting and rad to R scapula  [place] of abdomen ordered  Will update if symptoms worsen in interim  Adv to eat bland diet and high fat foods Labs ordered   Lab Orders     CBC with Differential/Platelet     Basic metabolic panel     Lipase     Hepatic function panel

## 2020-10-09 NOTE — Assessment & Plan Note (Signed)
Taking protonix 40 mg bid  Experiencing RUQ pain - may or may not be related  abd [place] ordered

## 2020-10-09 NOTE — Progress Notes (Signed)
Subjective:    Patient ID: Donald Merritt, male    DOB: [birth year], 34 y.o.   MRN: [id number]  This visit occurred during the SARS-CoV-2 public health emergency.  Safety protocols were in place, including screening questions prior to the visit, additional usage of staff PPE, and extensive cleaning of exam room while observing appropriate contact time as indicated for disinfecting solutions.    HPI Pt presents with GI symptoms and migraine   Wt Readings from Last 3 Encounters:  10/09/20 196 lb 8 oz (89.1 kg)  08/06/20 183 lb 3.2 oz (83.1 kg)  08/05/20 183 lb 1.6 oz (83.1 kg)   29.44 kg/m  Headaches- worse in the past 2 months  Migraines  Trying to deal with it   Gabapentin 100 mg twice  It was helping some  No side effects  He uses imitrex for the worse migraines (it may help or may not) -took more frequently last week   Caffeine- cut back on soda and drinking flavored water  No coffee and very little tea  Quit smoking 5 weeks ago   Triggers-weather changes  Stress- a little but not too bad  Does not sleep well - very light sleeper  Appetite is fine - trying not to eat too much  Work is physical/ not other exercise   Stomach issues  Constantly hurts - upper more than lower and up into esophagus  Some R sided  Constant nausea -sometimes vomits  Diarrhea on /off  No blood in stool  Takes protonix  Helped initially   Patient Active Problem List   Diagnosis Date Noted  . Abdominal pain, right upper quadrant 10/09/2020  . Abdominal pain 08/05/2020  . Routine general medical examination at a health care facility 04/19/2020  . Cerumen impaction 03/25/2020  . Chest wall pain 04/17/2019  . Chronic headaches 04/17/2019  . Stress reaction 04/17/2019  . IBS (irritable bowel syndrome) 01/04/2017  . Smoking 07/22/2015  . Hypertriglyceridemia 03/13/2014  . Screening for lipoid disorders 03/05/2014  . Right flank pain 11/14/2013  . GERD (gastroesophageal reflux disease)  09/04/2011  . History of kidney stones 03/12/2010  . MICROSCOPIC HEMATURIA 03/12/2010  . [place], PERSISTENT 03/15/2008  . Allergic rhinitis 02/13/2008  . DEPRESSION 04/27/2007   Past Medical History:  Diagnosis Date  . Allergy   . Depression   . Environmental allergies   . GERD (gastroesophageal reflux disease)   . Other and unspecified noninfectious gastroenteritis and colitis(558.9)   . Other malaise and fatigue   . Panic attack   . Renal stones   . Tobacco use disorder   . Unspecified sleep apnea    Past Surgical History:  Procedure Laterality Date  . neg hx     Social History   Tobacco Use  . Smoking status: Former Smoker    Packs/day: 1.00    Years: 15.00    Pack years: 15.00    Types: Cigarettes    Quit date: 09/04/2020    Years since quitting: 0.0  . Smokeless tobacco: Former Systems developer    Types: Snuff    Quit date: 10/20/2011  Vaping Use  . Vaping Use: Former  Substance Use Topics  . Alcohol use: Not Currently    Alcohol/week: 0.0 standard drinks  . Drug use: No   Family History  Problem Relation Age of Onset  . Asthma Mother   . Hypertension Father   . Heart disease Paternal Grandmother   . Diabetes Paternal Grandmother   . Lactose intolerance Daughter   .  Bone cancer Paternal Grandfather   . Colon cancer Neg Hx   . Esophageal cancer Neg Hx   . Pancreatic cancer Neg Hx   . Stomach cancer Neg Hx   . Kidney disease Neg Hx   . Liver disease Neg Hx    Allergies  Allergen Reactions  . Chantix [Varenicline] Shortness Of Breath  . Betadine [Povidone Iodine] Rash   Current Outpatient Medications on File Prior to Visit  Medication Sig Dispense Refill  . acetaminophen (TYLENOL) 325 MG tablet Take 650 mg by mouth every 6 (six) hours as needed.    . fluticasone (FLONASE) 50 MCG/ACT nasal spray Place 1 spray into both nostrils daily. 16 g 0  . glycopyrrolate (ROBINUL) 2 MG tablet TAKE 1 TABLET (2 MG TOTAL) BY MOUTH 2 (TWO) TIMES DAILY. AS NEEDED FOR  ABDOMINAL PAIN 180 tablet 0  . hyoscyamine (LEVSIN) 0.125 MG/ML solution Take 0.125 mg by mouth every 6 (six) hours as needed.    . loratadine (CLARITIN) 10 MG tablet Take 10 mg by mouth daily. 24 hours    . pantoprazole (PROTONIX) 40 MG tablet TAKE 1 TABLET BY MOUTH TWICE A DAY 180 tablet 1  . SUMAtriptan (IMITREX) 100 MG tablet Take 1 tablet (100 mg total) by mouth once for 1 dose. May repeat in 2 hours if headache persists or recurs. 10 tablet 11   No current facility-administered medications on file prior to visit.     Review of Systems  Constitutional: Negative for activity change, appetite change, fatigue, fever and unexpected weight change.  HENT: Negative for congestion, rhinorrhea, sore throat and trouble swallowing.   Eyes: Negative for pain, redness, itching and visual disturbance.  Respiratory: Negative for cough, chest tightness, shortness of breath and wheezing.   Cardiovascular: Negative for chest pain and palpitations.  Gastrointestinal: Positive for abdominal pain, nausea and vomiting. Negative for abdominal distention, anal bleeding, blood in stool, constipation, diarrhea and rectal pain.  Endocrine: Negative for cold intolerance, heat intolerance, polydipsia and polyuria.  Genitourinary: Negative for difficulty urinating, dysuria, frequency and urgency.  Musculoskeletal: Negative for arthralgias, joint swelling and myalgias.  Skin: Negative for pallor and rash.  Neurological: Positive for headaches. Negative for dizziness, tremors, syncope, facial asymmetry, speech difficulty, weakness, light-headedness and numbness.  Hematological: Negative for adenopathy. Does not bruise/bleed easily.  Psychiatric/Behavioral: Negative for decreased concentration and dysphoric mood. The patient is not nervous/anxious.        Objective:   Physical Exam Constitutional:      General: He is not in acute distress.    Appearance: Normal appearance. He is well-developed and well-nourished.  He is obese. He is not ill-appearing or diaphoretic.  HENT:     Head: Normocephalic and atraumatic.     Comments: No sinus or temporal tenderness    Right Ear: External ear normal.     Left Ear: External ear normal.     Nose: Nose normal.     Mouth/Throat:     Mouth: Oropharynx is clear and moist.     Pharynx: No oropharyngeal exudate.      Comments: No sinus tenderness No temporal tenderness  No TMJ tendernessEyes:     General: No scleral icterus.       Right eye: No discharge.        Left eye: No discharge.     Extraocular Movements: EOM normal.     Conjunctiva/sclera: Conjunctivae normal.     Pupils: Pupils are equal, round, and reactive to light.  Comments: No nystagmus  Neck:     Thyroid: No thyromegaly.     Vascular: No carotid bruit or JVD.     Trachea: No tracheal deviation.  Cardiovascular:     Rate and Rhythm: Normal rate and regular rhythm.     Heart sounds: Normal heart sounds. No murmur heard.   Pulmonary:     Effort: Pulmonary effort is normal. No respiratory distress.     Breath sounds: Normal breath sounds. No wheezing or rales.  Abdominal:     General: Abdomen is flat. Bowel sounds are normal. There is no distension.     Palpations: Abdomen is soft. There is no fluid wave, hepatomegaly, splenomegaly, mass or pulsatile mass.     Tenderness: There is abdominal tenderness in the right upper quadrant and epigastric area. There is no guarding or rebound. Positive signs include Murphy's sign. Negative signs include McBurney's sign.     Hernia: No hernia is present.  Musculoskeletal:        General: No tenderness or edema.     Cervical back: Full passive range of motion without pain, normal range of motion and neck supple.  Lymphadenopathy:     Cervical: No cervical adenopathy.  Skin:    General: Skin is warm and dry.     Coloration: Skin is not jaundiced or pale.     Findings: No rash.  Neurological:     General: No focal deficit present.     Mental  Status: He is alert and oriented to person, place, and time.     Cranial Nerves: No cranial nerve deficit.     Sensory: No sensory deficit.     Motor: No weakness, tremor, atrophy or abnormal muscle tone.     Coordination: He displays a negative Romberg sign. Coordination normal.     Gait: Gait normal.     Deep Tendon Reflexes: Strength normal and reflexes are normal and symmetric. Reflexes normal.     Comments: No focal cerebellar signs   Psychiatric:        Mood and Affect: Mood and affect and mood normal.        Behavior: Behavior normal.        Thought Content: Thought content normal.           Assessment & Plan:   Problem List Items Addressed This Visit      Digestive   GERD (gastroesophageal reflux disease)    Taking protonix 40 mg bid  Experiencing RUQ pain - may or may not be related  abd [place] ordered       IBS (irritable bowel syndrome)    More diarrhea than constipation lately  No particular food triggers         Other   Chronic headaches    Worse in the past 2 months  He cut caffeine and quit smoking  Uses imitrex prn  Plan to inc gabapentin to 300 mg bid (disc poss side eff such as sedation or dizziness)  Disc stressors and weather change as triggers  F/u planned  inst to call if symptoms worsen in the meantime       Relevant Medications   gabapentin (NEURONTIN) 300 MG capsule   Abdominal pain, right upper quadrant - Primary    With hx of GERD and IBS also  Now more focal discomfort  With nausea /occ vomiting and rad to R scapula  [place] of abdomen ordered  Will update if symptoms worsen in interim  Adv to eat bland  diet and high fat foods Labs ordered   Lab Orders     CBC with Differential/Platelet     Basic metabolic panel     Lipase     Hepatic function panel       Relevant Orders   CBC with Differential/Platelet (Completed)   Basic metabolic panel (Completed)   Lipase (Completed)   Hepatic function panel (Completed)   US Abdomen  Complete    Other Visit Diagnoses    Need for influenza vaccination       Relevant Orders   Flu Vaccine QUAD 6+ mos PF IM (Fluarix Quad PF) (Completed)

## 2020-10-09 NOTE — Assessment & Plan Note (Signed)
Worse in the past 2 months  He cut caffeine and quit smoking  Uses imitrex prn  Plan to inc gabapentin to 300 mg bid (disc poss side eff such as sedation or dizziness)  Disc stressors and weather change as triggers  F/u planned  inst to call if symptoms worsen in the meantime

## 2020-10-09 NOTE — Patient Instructions (Addendum)
Flu shot today   Keep up fluids  Continue protonix Labs today  I ordered an abd [place]   Increase gabapentin to 300 mg twice daily  If problems let me know   Avoid caffeine

## 2020-10-10 ENCOUNTER — Ambulatory Visit
Admission: RE | Admit: 2020-10-10 | Discharge: 2020-10-10 | Disposition: A | Discharge status: Home / Self Care | Payer: BC Managed Care – PPO | Source: Ambulatory Visit | Attending: Family Medicine | Admitting: Family Medicine

## 2020-10-10 ENCOUNTER — Telehealth: Payer: Self-pay | Admitting: Family Medicine

## 2020-10-10 ENCOUNTER — Other Ambulatory Visit: Payer: Self-pay

## 2020-10-10 DIAGNOSIS — R1011 Right upper quadrant pain: Secondary | ICD-10-CM | POA: Insufficient documentation

## 2020-10-10 MED ORDER — SUCRALFATE 1 G PO TABS: 1.0000 g | ORAL_TABLET | Freq: Three times a day (TID) | ORAL | 1 refills | Status: DC

## 2020-10-10 NOTE — Addendum Note (Signed)
Addended by: Loura Pardon A on: 10/10/2020 12:59 PM   Modules accepted: Orders

## 2020-10-10 NOTE — Telephone Encounter (Signed)
Pended px for carafate to try It coats stomach and hope this helps pain  Please send to pharmacy of choice

## 2020-10-10 NOTE — Telephone Encounter (Signed)
Patient is calling in asking for pain medicine for his "issue" that he discussed today with Dr. Glori Bickers. Please advise. EM

## 2020-10-10 NOTE — Telephone Encounter (Signed)
Pt notified Rx sent to pharmacy and advised pt of Dr. Marliss Coots comments

## 2020-10-10 NOTE — Telephone Encounter (Signed)
-----   Message from Donald Merritt, [place] sent at 10/10/2020 11:56 AM EST ----- Pt notified of [place] results and Dr. Marliss Coots comments. Pt agrees to go back to GI, he is okay with [place] referring him back to [facility] because pt said pain is still there. I advise pt PCP will pt referral in and our [facility] will f/u with pt

## 2020-10-10 NOTE — Addendum Note (Signed)
Addended by: Tammi Sou on: 10/10/2020 01:03 PM   Modules accepted: Orders

## 2020-10-28 ENCOUNTER — Encounter: Payer: Self-pay | Admitting: Gastroenterology

## 2020-10-28 ENCOUNTER — Ambulatory Visit (INDEPENDENT_AMBULATORY_CARE_PROVIDER_SITE_OTHER): Payer: BC Managed Care – PPO | Admitting: Gastroenterology

## 2020-10-28 VITALS — BP 142/84 | HR 96 | Ht 68.5 in | Wt 196.2 lb

## 2020-10-28 DIAGNOSIS — R1011 Right upper quadrant pain: Secondary | ICD-10-CM | POA: Diagnosis not present

## 2020-10-28 NOTE — Telephone Encounter (Signed)
Went to call pt and realized he had a GI appt today to discuss this pain, please see their OV note

## 2020-10-28 NOTE — Telephone Encounter (Signed)
Please get some details about symptoms re: is this the same area of pain that we were looking at or something new?  Also how is fluid intake? Any blood in urine?

## 2020-10-28 NOTE — Telephone Encounter (Signed)
Patient called in stating he believes a kidney stone that's moving and is wondering what to do. Please advise.

## 2020-10-28 NOTE — Telephone Encounter (Signed)
Thanks for the heads up. I think they are working on a HIDA scan for him.  If he could drop off a urine for dip tomorrow for flank pain-that would be helpful

## 2020-10-28 NOTE — Progress Notes (Signed)
10/28/2020 [name] [id number] 1985-11-28   HISTORY OF PRESENT ILLNESS: This is a 35 year old male is a patient of Dr. Lynne Leader.  He is seen here periodically every couple of years or so for issues with acid reflux and IBS type symptoms he is on pantoprazole 40 mg twice daily for acid reflux and has glycopyrrolate at home for his IBS symptoms.  He has not been seen here since September 2019.  He tells me that back in October he started having right upper quadrant abdominal pain that seems to go to the right side of his back and down the right side of his abdomen as well.  CT scan of the abdomen and pelvis with contrast was unremarkable.  Abdominal ultrasound was unremarkable except for probable fatty liver.  CBC, CMP, lipase all unremarkable.  He was told that this pain could be musculoskeletal.  He said that he had a kidney stone in the past and he thinks it could be a kidney stone.  He said he started taking some Azo over the weekend and has seemed to help.  PCP placed him on Carafate tablets 4 times daily which he has not been taking as well just for the past couple of weeks.  Past Medical History:  Diagnosis Date  . Allergy   . Depression   . Environmental allergies   . GERD (gastroesophageal reflux disease)   . Other and unspecified noninfectious gastroenteritis and colitis(558.9)   . Other malaise and fatigue   . Panic attack   . Renal stones   . Tobacco use disorder   . Unspecified sleep apnea    Past Surgical History:  Procedure Laterality Date  . neg hx      reports that he quit smoking about 7 weeks ago. His smoking use included cigarettes. He has a 15.00 pack-year smoking history. He quit smokeless tobacco use about 9 years ago.  His smokeless tobacco use included snuff. He reports previous alcohol use. He reports that he does not use drugs. family history includes Asthma in his mother; Bone cancer in his paternal grandfather; Diabetes in his paternal grandmother;  Heart disease in his paternal grandmother; Hypertension in his father; Lactose intolerance in his daughter. Allergies  Allergen Reactions  . Chantix [Varenicline] Shortness Of Breath  . Betadine [Povidone Iodine] Rash      Outpatient Encounter Medications as of 10/28/2020  Medication Sig  . acetaminophen (TYLENOL) 325 MG tablet Take 650 mg by mouth every 6 (six) hours as needed.  . fluticasone (FLONASE) 50 MCG/ACT nasal spray Place 1 spray into both nostrils daily.  Marland Kitchen gabapentin (NEURONTIN) 300 MG capsule Take 1 capsule (300 mg total) by mouth 2 (two) times daily.  Marland Kitchen glycopyrrolate (ROBINUL) 2 MG tablet TAKE 1 TABLET (2 MG TOTAL) BY MOUTH 2 (TWO) TIMES DAILY. AS NEEDED FOR ABDOMINAL PAIN  . loratadine (CLARITIN) 10 MG tablet Take 10 mg by mouth daily. 24 hours  . pantoprazole (PROTONIX) 40 MG tablet TAKE 1 TABLET BY MOUTH TWICE A DAY  . sucralfate (CARAFATE) 1 g tablet Take 1 tablet (1 g total) by mouth 4 (four) times daily -  with meals and at bedtime. Take with water  . SUMAtriptan (IMITREX) 100 MG tablet Take 1 tablet (100 mg total) by mouth once for 1 dose. May repeat in 2 hours if headache persists or recurs.  . [DISCONTINUED] hyoscyamine (LEVSIN) 0.125 MG/ML solution Take 0.125 mg by mouth every 6 (six) hours as needed. (Patient not taking: Reported  on 10/28/2020)   No facility-administered encounter medications on file as of 10/28/2020.     REVIEW OF SYSTEMS  : All other systems reviewed and negative except where noted in the History of Present Illness.   PHYSICAL EXAM: BP (!) 142/84 (BP Location: Left Arm, Patient Position: Sitting, Cuff Size: Normal)   Pulse 96   Ht 5' 8.5" (1.74 m)   Wt 196 lb 4 oz (89 kg)   BMI 29.41 kg/m  General: Well developed white male in no acute distress; very anxious appearing Head: Normocephalic and atraumatic Eyes:  Sclerae anicteric, conjunctiva pink. Ears: Normal auditory acuity Lungs: Clear throughout to auscultation; no W/R/R. Heart:  Regular rate and rhythm; no M/R/G. Abdomen: Soft, non-distended.  BS present.  RUQ, right flank, and right abdominal TTP. Musculoskeletal: Symmetrical with no gross deformities  Skin: No lesions on visible extremities Extremities: No edema  Neurological: Alert oriented x 4, grossly non-focal Psychological:  Alert and cooperative. Normal mood and affect  ASSESSMENT AND PLAN: *Right upper quadrant abdominal pain that radiates to the right back/flank area: Began back in October.  So far extensive labs, CT scan, abdominal ultrasound have all been unremarkable.  He was told it could be a pulled muscle, and I agree I think it could be a muscle strain since this can last for quite some time.  He is not in agreement and thinks that it could be a kidney stone.  I told him that neither of his imaging studies suggest that so he can address it further with his PCP.  From our standpoint gallbladder looks fine structurally.  Could perform HIDA scan to rule out biliary dysfunction.  He would like to have that performed.  We will schedule that.  Otherwise he can take Tylenol, use a heating pad, take his antispasmodic medications that are used to treat his IBS, etc.   CC:  Tower, Wynelle Fanny, MD

## 2020-10-28 NOTE — Progress Notes (Signed)
Reviewed and agree with management plan.  Jenniferann Stuckert T. Fortunato Nordin, MD FACG [facility]  

## 2020-10-28 NOTE — Patient Instructions (Signed)
If you are age 36 or older, your body mass index should be between 23-30. Your Body mass index is 29.41 kg/m. If this is out of the aforementioned range listed, please consider follow up with your Primary Care Provider.  If you are age 36 or younger, your body mass index should be between 19-25. Your Body mass index is 29.41 kg/m. If this is out of the aformentioned range listed, please consider follow up with your Primary Care Provider.   You have been scheduled for a HIDA scan at [facility]iology (1st floor) on 11/12/2020. Please arrive 30 minutes prior to your scheduled appointment at  8:24 am. Make certain not to have anything to eat or drink starting at midnight. Do not have any opioid based drugs 6 hours prior to imaging. You may need someone to drive you home after the study has been completed. Should this appointment date or time not work well for you, please call radiology scheduling at [phone].  _____________________________________________________________________ hepatobiliary (HIDA) scan is an imaging procedure used to diagnose problems in the liver, gallbladder and bile ducts. In the HIDA scan, a radioactive chemical or tracer is injected into a vein in your arm. The tracer is handled by the liver like bile. Bile is a fluid produced and excreted by your liver that helps your digestive system break down fats in the foods you eat. Bile is stored in your gallbladder and the gallbladder releases the bile when you eat a meal. A special nuclear medicine scanner (gamma camera) tracks the flow of the tracer from your liver into your gallbladder and small intestine.  During your HIDA scan  You'll be asked to change into a hospital gown before your HIDA scan begins. Your health care team will position you on a table, usually on your back. The radioactive tracer is then injected into a vein in your arm.The tracer travels through your bloodstream to your liver, where it's taken up by the  bile-producing cells. The radioactive tracer travels with the bile from your liver into your gallbladder and through your bile ducts to your small intestine.You may feel some pressure while the radioactive tracer is injected into your vein. As you lie on the table, a special gamma camera is positioned over your abdomen taking pictures of the tracer as it moves through your body. The gamma camera takes pictures continually for about an hour. You'll need to keep still during the HIDA scan. This can become uncomfortable, but you may find that you can lessen the discomfort by taking deep breaths and thinking about other things. Tell your health care team if you're uncomfortable. The radiologist will watch on a computer the progress of the radioactive tracer through your body. The HIDA scan may be stopped when the radioactive tracer is seen in the gallbladder and enters your small intestine. This typically takes about an hour. In some cases extra imaging will be performed if original images aren't satisfactory, if morphine is given to help visualize the gallbladder or if the medication CCK is given to look at the contraction of the gallbladder. This test typically takes 2 hours to complete. ______________________________________________________________  Continue use of Heating Pad, IBS spasm medication and tylenol as needed.  We will contact you about your results and discuss follow up if needed.  Thank you for entrusting me with your care and choosing [facility].  Alonza Bogus, PA-C

## 2020-10-29 NOTE — Telephone Encounter (Signed)
Pt will drop off urine sample when able

## 2020-10-30 ENCOUNTER — Other Ambulatory Visit: Payer: Self-pay

## 2020-10-30 ENCOUNTER — Other Ambulatory Visit (INDEPENDENT_AMBULATORY_CARE_PROVIDER_SITE_OTHER): Payer: BC Managed Care – PPO

## 2020-10-30 DIAGNOSIS — R109 Unspecified abdominal pain: Secondary | ICD-10-CM | POA: Diagnosis not present

## 2020-10-30 LAB — POC URINALSYSI DIPSTICK (AUTOMATED)
Bilirubin, UA: NEGATIVE
Blood, UA: NEGATIVE
Glucose, UA: NEGATIVE
Ketones, UA: NEGATIVE
Leukocytes, UA: NEGATIVE
Nitrite, UA: NEGATIVE
Protein, UA: NEGATIVE
Spec Grav, UA: 1.01 (ref 1.010–1.025)
Urobilinogen, UA: 0.2 E.U./dL
pH, UA: 8.5 — AB (ref 5.0–8.0)

## 2020-11-12 ENCOUNTER — Ambulatory Visit (HOSPITAL_COMMUNITY)
Admission: RE | Admit: 2020-11-12 | Discharge: 2020-11-12 | Disposition: A | Discharge status: Home / Self Care | Payer: BC Managed Care – PPO | Source: Ambulatory Visit | Attending: Gastroenterology | Admitting: Gastroenterology

## 2020-11-12 ENCOUNTER — Other Ambulatory Visit: Payer: Self-pay

## 2020-11-12 DIAGNOSIS — R112 Nausea with vomiting, unspecified: Secondary | ICD-10-CM | POA: Diagnosis not present

## 2020-11-12 DIAGNOSIS — R1011 Right upper quadrant pain: Secondary | ICD-10-CM | POA: Diagnosis not present

## 2020-11-12 MED ORDER — FLUDEOXYGLUCOSE F - 18 (FDG) INJECTION: 5.5000 | Freq: Once | INTRAVENOUS | Status: DC | PRN

## 2020-11-12 MED ORDER — TECHNETIUM TC 99M MEBROFENIN IV KIT
5.5000 | PACK | Freq: Once | INTRAVENOUS | Status: AC | PRN
  Administered 2020-11-12: 5.5 via INTRAVENOUS

## 2020-11-22 ENCOUNTER — Other Ambulatory Visit: Payer: Self-pay | Admitting: Family Medicine

## 2020-12-03 ENCOUNTER — Ambulatory Visit: Payer: BC Managed Care – PPO | Admitting: Family Medicine

## 2020-12-17 ENCOUNTER — Other Ambulatory Visit: Payer: Self-pay | Admitting: Family Medicine

## 2021-02-27 ENCOUNTER — Other Ambulatory Visit: Payer: Self-pay | Admitting: Family Medicine

## 2021-03-03 ENCOUNTER — Other Ambulatory Visit: Payer: Self-pay | Admitting: Family Medicine

## 2021-05-10 ENCOUNTER — Other Ambulatory Visit: Payer: Self-pay | Admitting: Family Medicine

## 2021-05-12 NOTE — Telephone Encounter (Signed)
Last filled on 04/19/20 #10 tabs with 11 refills, last OV was 10/09/20 for GI problems and Migraines. No future, please advise

## 2021-05-25 ENCOUNTER — Other Ambulatory Visit: Payer: Self-pay | Admitting: Family Medicine

## 2021-07-28 DIAGNOSIS — Z23 Encounter for immunization: Secondary | ICD-10-CM | POA: Diagnosis not present

## 2021-09-09 ENCOUNTER — Other Ambulatory Visit: Payer: Self-pay | Admitting: Nurse Practitioner

## 2021-09-09 ENCOUNTER — Other Ambulatory Visit: Payer: Self-pay

## 2021-09-09 ENCOUNTER — Telehealth (INDEPENDENT_AMBULATORY_CARE_PROVIDER_SITE_OTHER): Payer: BC Managed Care – PPO | Admitting: Nurse Practitioner

## 2021-09-09 VITALS — Temp 98.2°F

## 2021-09-09 DIAGNOSIS — J029 Acute pharyngitis, unspecified: Secondary | ICD-10-CM | POA: Diagnosis not present

## 2021-09-09 DIAGNOSIS — J101 Influenza due to other identified influenza virus with other respiratory manifestations: Secondary | ICD-10-CM

## 2021-09-09 DIAGNOSIS — G4489 Other headache syndrome: Secondary | ICD-10-CM | POA: Insufficient documentation

## 2021-09-09 DIAGNOSIS — R6883 Chills (without fever): Secondary | ICD-10-CM | POA: Diagnosis not present

## 2021-09-09 DIAGNOSIS — F17200 Nicotine dependence, unspecified, uncomplicated: Secondary | ICD-10-CM

## 2021-09-09 DIAGNOSIS — R52 Pain, unspecified: Secondary | ICD-10-CM | POA: Insufficient documentation

## 2021-09-09 DIAGNOSIS — R051 Acute cough: Secondary | ICD-10-CM | POA: Diagnosis not present

## 2021-09-09 LAB — POCT INFLUENZA A/B
Influenza A, POC: POSITIVE — AB
Influenza B, POC: NEGATIVE

## 2021-09-09 LAB — POCT RAPID STREP A (OFFICE): Rapid Strep A Screen: NEGATIVE

## 2021-09-09 MED ORDER — OSELTAMIVIR PHOSPHATE 75 MG PO CAPS: 75.0000 mg | ORAL_CAPSULE | Freq: Two times a day (BID) | ORAL | 0 refills | Status: DC

## 2021-09-09 NOTE — Assessment & Plan Note (Signed)
Pending flu and COVID test.

## 2021-09-09 NOTE — Assessment & Plan Note (Signed)
Has been exposed to flu and other illness with coworkers.  Pending flu and strep test in office at home COVID test was negative

## 2021-09-09 NOTE — Assessment & Plan Note (Signed)
Patient is still currently smoking smoking 1-1/2 packs a day.

## 2021-09-09 NOTE — Assessment & Plan Note (Signed)
Continue over-the-counter medications currently.  Patient will come in and have flu and strep testing performed.  Pending results he has several COVID tests at home if these are negative that he can repeat continue to monitor.

## 2021-09-09 NOTE — Progress Notes (Signed)
Patient ID: Donald Merritt, male    DOB: [birth year], 35 y.o.   MRN: [id number]  Virtual visit completed through [place], a video enabled telemedicine application. Due to national recommendations of social distancing due to COVID-19, a virtual visit is felt to be most appropriate for this patient at this time. Reviewed limitations, risks, security and privacy concerns of performing a virtual visit and the availability of in person appointments. I also reviewed that there may be a patient responsible charge related to this service. The patient agreed to proceed.   Phone call 9 minutes and 55 secs  Patient location: home Provider location: [place] [facility], office Persons participating in this virtual visit: patient, provider   If any vitals were documented, they were collected by patient at home unless specified below.    Temp 98.2 F (36.8 C) Comment: per patient   CC: Cough Subjective:   HPI: Donald Merritt is a 35 y.o. male presenting on 09/09/2021 for Cough (Sx started 11/08/20, sore throat, runny nose, post nasal drip, chills, sweats, headache, achy. No fever. Covid test negative 09/08/21. Has taking Mucinnex.)  Symptoms started yesterday 09/08/2021 Covid test was negative yesterday evening Vaccinated covid [employer] x [employer] employee had flu possibly Son had flu last week  Mucinex has not been helping   Still smokes 1.5 ppd   Relevant past medical, surgical, family and social history reviewed and updated as indicated. Interim medical history since our last visit reviewed. Allergies and medications reviewed and updated. Outpatient Medications Prior to Visit  Medication Sig Dispense Refill   acetaminophen (TYLENOL) 325 MG tablet Take 650 mg by mouth every 6 (six) hours as needed.     fluticasone (FLONASE) 50 MCG/ACT nasal spray Place 1 spray into both nostrils daily. 16 g 0   gabapentin (NEURONTIN) 300 MG capsule TAKE 1 CAPSULE BY MOUTH TWICE A DAY 60 capsule 3    loratadine (CLARITIN) 10 MG tablet Take 10 mg by mouth daily. 24 hours     pantoprazole (PROTONIX) 40 MG tablet TAKE 1 TABLET BY MOUTH TWICE A DAY 180 tablet 0   sucralfate (CARAFATE) 1 g tablet TAKE 1 TABLET BY MOUTH FOUR TIMES A DAY WITH MEALS AND AT BEDTIME 360 tablet 1   SUMAtriptan (IMITREX) 100 MG tablet Take 1 tablet (100 mg total) by mouth once daily as needed. May repeat in 2 hours if headache persists or recurs. 10 tablet 5   glycopyrrolate (ROBINUL) 2 MG tablet TAKE 1 TABLET (2 MG TOTAL) BY MOUTH 2 (TWO) TIMES DAILY. AS NEEDED FOR ABDOMINAL PAIN (Patient not taking: Reported on 09/09/2021) 180 tablet 0   No facility-administered medications prior to visit.     Per HPI unless specifically indicated in ROS section below Review of Systems  Constitutional:  Positive for appetite change, chills and fatigue. Negative for fever.  HENT:  Positive for congestion, ear pain (pressure), sinus pressure and sore throat.   Respiratory:  Positive for cough (clear,m thicker) and shortness of breath (doe).   Cardiovascular:  Negative for chest pain.  Gastrointestinal:  Negative for abdominal pain, diarrhea, nausea and vomiting.  Musculoskeletal:  Positive for myalgias. Negative for arthralgias.  Neurological:  Positive for light-headedness and headaches.  Objective:  Temp 98.2 F (36.8 C) Comment: per patient  Wt Readings from Last 3 Encounters:  10/28/20 196 lb 4 oz (89 kg)  10/09/20 196 lb 8 oz (89.1 kg)  08/06/20 183 lb 3.2 oz (83.1 kg)  Physical exam: Gen: alert, NAD, not ill appearing Pulm: speaks in complete sentences without increased work of breathing Psych: normal mood, normal thought content      Results for orders placed or performed in visit on 10/30/20  POCT Urinalysis Dipstick (Automated)  Result Value Ref Range   Color, UA Yellow    Clarity, UA Clear    Glucose, UA Negative Negative   Bilirubin, UA Negative    Ketones, UA Negative    Spec Grav, UA 1.010 1.010 -  1.025   Blood, UA Negative    pH, UA 8.5 (A) 5.0 - 8.0   Protein, UA Negative Negative   Urobilinogen, UA 0.2 0.2 or 1.0 E.U./dL   Nitrite, UA Negative    Leukocytes, UA Negative Negative   Assessment & Plan:   Problem List Items Addressed This Visit       Other   Smoking    Patient is still currently smoking smoking 1-1/2 packs a day.      Sore throat    Pending flu and COVID test.      Relevant Orders   Rapid Strep A   Chills   Relevant Orders   Influenza A/B   Other headache syndrome   Relevant Orders   Influenza A/B   Body aches    Has been exposed to flu and other illness with coworkers.  Pending flu and strep test in office at home COVID test was negative      Relevant Orders   Influenza A/B   Acute cough - Primary    Continue over-the-counter medications currently.  Patient will come in and have flu and strep testing performed.  Pending results he has several COVID tests at home if these are negative that he can repeat continue to monitor.      Relevant Orders   Influenza A/B     Continue otc medications. Has 3 test at home for covid left. Tylenol for body aches and probable fever  No orders of the defined types were placed in this encounter.  No orders of the defined types were placed in this encounter.   I discussed the assessment and treatment plan with the patient. The patient was provided an opportunity to ask questions and all were answered. The patient agreed with the plan and demonstrated an understanding of the instructions. The patient was advised to call back or seek an in-person evaluation if the symptoms worsen or if the condition fails to improve as anticipated.  Follow up plan: No follow-ups on file.  Romilda Garret, NP  n

## 2021-09-09 NOTE — Progress Notes (Signed)
Called and discussed results with patient nd treatment options. Tamiflu sent in

## 2021-09-10 NOTE — Telephone Encounter (Signed)
Left message for patient advising of the message from the provider. I asked patient to call around because I do not know what pharmacies are around him that he would want to use and to call me back if he finds another pharmacy and let us know

## 2021-09-24 ENCOUNTER — Encounter: Payer: Self-pay | Admitting: Family Medicine

## 2021-09-24 ENCOUNTER — Ambulatory Visit: Payer: BC Managed Care – PPO | Admitting: Family Medicine

## 2021-09-24 ENCOUNTER — Other Ambulatory Visit: Payer: Self-pay

## 2021-09-24 VITALS — BP 136/84 | HR 98 | Temp 98.7°F | Ht 68.5 in | Wt 191.0 lb

## 2021-09-24 DIAGNOSIS — K582 Mixed irritable bowel syndrome: Secondary | ICD-10-CM

## 2021-09-24 DIAGNOSIS — K219 Gastro-esophageal reflux disease without esophagitis: Secondary | ICD-10-CM | POA: Diagnosis not present

## 2021-09-24 DIAGNOSIS — F17200 Nicotine dependence, unspecified, uncomplicated: Secondary | ICD-10-CM | POA: Diagnosis not present

## 2021-09-24 MED ORDER — GLYCOPYRROLATE 2 MG PO TABS
2.0000 mg | ORAL_TABLET | Freq: Two times a day (BID) | ORAL | 1 refills | Status: DC
Start: 2021-09-24 — End: 2022-06-17

## 2021-09-24 MED ORDER — NICOTINE 10 MG IN INHA: 1.0000 | RESPIRATORY_TRACT | 1 refills | Status: DC | PRN

## 2021-09-24 MED ORDER — PANTOPRAZOLE SODIUM 40 MG PO TBEC: 40.0000 mg | DELAYED_RELEASE_TABLET | Freq: Two times a day (BID) | ORAL | 3 refills | Status: AC

## 2021-09-24 NOTE — Assessment & Plan Note (Addendum)
Pt wishes to quit (his stressors are not as bad) Has quit for 4 mo in past-vaped nicotine at that time  Wants to avoid vaping  Intol to chantix in the past  Smokes 1 1/2 ppd currently (more than prev) and has smoked since 35 yo  Discussed options for cessation  Will try nicotine inhaler-puff as directed as needed inst not to smoke while using  Goal to gradually cut # of inhalers per day to wean off nicotine Can also consider change to patch or gum in the future as well  Will plan f/u in mid feb when we are back in the regular office

## 2021-09-24 NOTE — Assessment & Plan Note (Signed)
Pt in the midst of episode with some constipation but loose small stools, cramping and low abd pain as well as nausea  Extensive last w/u reviewed including EGD, colonoscopy, CT abd pelvis and [place] abd  Also reviewed last GI report  He has been off robinul (px bid prn but he took regularly) and thinks this may be reason for flare This was px to refill and start  (tolerates it well)  Enc to continue watching diet  Also keep up fluids  May try miralax if he feels constipated  Will adv GI f/u if not improved  inst to update if worse (ER if severe) or not improving

## 2021-09-24 NOTE — Patient Instructions (Addendum)
If you feel very constipated you can drink miralax   Eat bland if you feel nauseated  Go back on the robinul as directed  Update if not starting to improve in a week or if worsening    If any severe symptoms please go to the ER   Try the nicotine inhaler -as needed when you stop cigarettes   Call in mid feburary to schedule an appt

## 2021-09-24 NOTE — Progress Notes (Signed)
Subjective:    Patient ID: Donald Merritt, male    DOB: August 01, 1986, 35 y.o.   MRN: [id number]  This visit occurred during the SARS-CoV-2 public health emergency.  Safety protocols were in place, including screening questions prior to the visit, additional usage of staff PPE, and extensive cleaning of exam room while observing appropriate contact time as indicated for disinfecting solutions.   HPI Pt presents for GI c/o and to discuss smoking   Wt Readings from Last 3 Encounters:  09/24/21 191 lb (86.6 kg)  10/28/20 196 lb 4 oz (89 kg)  10/09/20 196 lb 8 oz (89.1 kg)   28.62 kg/m  Flare of his GI condition   (this is his usual)  Bad since Monday  (liquid) Started with n/v and pain all over abdomen Constipated  - just passing a little liquid stool  Had a head ache this am  Some heartburn on/off -not feeling it today  (protonix helps)   He really watches what he eats  He avoids eating meat late in the day (but had a little pot roast Sunday am)  Avoids all fatty foods  (dislikes it anyway)   H/o GERD and IBS  Last visit to GI was jan 2022 Reviewed note with PA Zehr, he has also seen Dr Fuller Plan  Had HIDA shortly after with nl GB EF -normal  Has taken protonix 40 mg bid in the past  Also carafate  Also robinul-but ran out of it , was taking it bid and it seemed to help his IBS    Last abd [place] 09/2020  US Abdomen Complete (Accession [id number]) (Order [id number]) Imaging Date: 10/10/2020 Department: [facility] Released By: Urban Gibson Authorizing: Demorio Seeley, Wynelle Fanny, MD   Exam Status  Status  Final [99]   PACS Intelerad Image Link   Show images for US Abdomen Complete  Study Result  Narrative & Impression  CLINICAL DATA:  RIGHT upper quadrant pain chronically for 10 years, nausea, vomiting, history of renal calculi, GERD, former smoker   EXAM: ABDOMEN ULTRASOUND COMPLETE   COMPARISON:  CT abdomen and pelvis 08/05/2020    FINDINGS: Gallbladder: Normally distended without stones or wall thickening. No pericholecystic fluid or sonographic Murphy sign.   Common bile duct: Diameter: 3 mm, normal   Liver: Echogenic parenchyma, likely fatty infiltration though this can be seen with cirrhosis and certain infiltrative disorders. No focal hepatic mass or nodularity. Portal vein is patent on color Doppler imaging with normal direction of blood flow towards the liver.   IVC: Normal appearance   Pancreas: Portions of head and tail obscured by bowel gas, visualized portions normal appearance   Spleen: Normal appearance, 12.0 cm length   Right Kidney: Length: 11.0 cm. Normal morphology without mass or hydronephrosis.   Left Kidney: Length: 10.2 cm. Normal morphology without mass or hydronephrosis.   Abdominal aorta: Normal caliber   Other findings: No free fluid   IMPRESSION: Probable fatty infiltration of liver as above.   Incomplete pancreatic visualization.   No acute upper abdominal sonographic abnormalities identified.     Last CT abd/pelvis 07/2020 CT Abdomen Pelvis W Contrast (Accession [id number]) (Order [id number]) Imaging Date: 08/05/2020 Department: [facility] EMERGENCY DEPARTMENT Released By/Authorizing: Daleen Bo, MD (auto-released)   Exam Status  Status  Final [99]   PACS Intelerad Image Link   Show images for CT Abdomen Pelvis W Contrast  Study Result  Narrative & Impression  CLINICAL DATA:  Abdominal pain for  5 days with abscess/infection suspected   EXAM: CT ABDOMEN AND PELVIS WITH CONTRAST   TECHNIQUE: Multidetector CT imaging of the abdomen and pelvis was performed using the standard protocol following bolus administration of intravenous contrast.   CONTRAST:  154mL OMNIPAQUE IOHEXOL 300 MG/ML  SOLN   COMPARISON:  03/06/2019   FINDINGS: Lower chest:  No contributory findings.   Hepatobiliary: No focal liver abnormality.No evidence of  biliary obstruction or stone.   Pancreas: Unremarkable including a stable lobulation ventrally underneath the pylorus.   Spleen: Unremarkable.   Adrenals/Urinary Tract: Negative adrenals. No hydronephrosis or stone. Unremarkable bladder.   Stomach/Bowel:  No obstruction. No appendicitis.   Vascular/Lymphatic: No acute vascular abnormality. No mass or adenopathy.   Reproductive:No pathologic findings.   Other: No ascites or pneumoperitoneum.   Musculoskeletal: No acute abnormalities.  L3 limbus.   IMPRESSION: No explanation for abdominal pain.     Smoking  Has smoked since age 56  He cut back and vaped for a while then went back to smoking in march  1-1/2 ppd  No respiratory symptoms from smoking  Went 4 mo w/o smoking - only vaped (nicotine)  He never cut down the nicotine on it  He felt more moody and had more headaches then   Tried chantix in the past - gave him sob   Interested in nicotine products   Just had the flu last mo   Not as stressed this year - he thinks this is a better time to quit  On first shift   Headaches are a little worse  Thinks quitting smoking may help  Patient Active Problem List   Diagnosis Date Noted   Sore throat 09/09/2021   Other headache syndrome 09/09/2021   Routine general medical examination at a health care facility 04/19/2020   Cerumen impaction 03/25/2020   Chronic headaches 04/17/2019   Stress reaction 04/17/2019   IBS (irritable bowel syndrome) 01/04/2017   Smoking 07/22/2015   Hypertriglyceridemia 03/13/2014   Screening for lipoid disorders 03/05/2014   GERD (gastroesophageal reflux disease) 09/04/2011   History of kidney stones 03/12/2010   MICROSCOPIC HEMATURIA 03/12/2010   HYPERSOMNIA, PERSISTENT 03/15/2008   Allergic rhinitis 02/13/2008   DEPRESSION 04/27/2007   Past Medical History:  Diagnosis Date   Allergy    Depression    Environmental allergies    GERD (gastroesophageal reflux disease)    Other  and unspecified noninfectious gastroenteritis and colitis(558.9)    Other malaise and fatigue    Panic attack    Renal stones    Tobacco use disorder    Unspecified sleep apnea    Past Surgical History:  Procedure Laterality Date   neg hx     Social History   Tobacco Use   Smoking status: Former    Packs/day: 1.00    Years: 15.00    Pack years: 15.00    Types: Cigarettes    Quit date: 09/04/2020    Years since quitting: 1.0   Smokeless tobacco: Former    Types: Snuff    Quit date: 10/20/2011  Vaping Use   Vaping Use: Former  Substance Use Topics   Alcohol use: Not Currently    Alcohol/week: 0.0 standard drinks   Drug use: No   Family History  Problem Relation Age of Onset   Asthma Mother    Hypertension Father    Heart disease Paternal Grandmother    Diabetes Paternal Grandmother    Lactose intolerance Daughter    Bone cancer Paternal Grandfather  Colon cancer Neg Hx    Esophageal cancer Neg Hx    Pancreatic cancer Neg Hx    Stomach cancer Neg Hx    Kidney disease Neg Hx    Liver disease Neg Hx    Allergies  Allergen Reactions   Chantix [Varenicline] Shortness Of Breath   Betadine [Povidone Iodine] Rash   Current Outpatient Medications on File Prior to Visit  Medication Sig Dispense Refill   acetaminophen (TYLENOL) 325 MG tablet Take 650 mg by mouth every 6 (six) hours as needed.     fluticasone (FLONASE) 50 MCG/ACT nasal spray Place 1 spray into both nostrils daily. 16 g 0   gabapentin (NEURONTIN) 300 MG capsule TAKE 1 CAPSULE BY MOUTH TWICE A DAY 60 capsule 3   loratadine (CLARITIN) 10 MG tablet Take 10 mg by mouth daily. 24 hours     sucralfate (CARAFATE) 1 g tablet TAKE 1 TABLET BY MOUTH FOUR TIMES A DAY WITH MEALS AND AT BEDTIME 360 tablet 1   SUMAtriptan (IMITREX) 100 MG tablet Take 1 tablet (100 mg total) by mouth once daily as needed. May repeat in 2 hours if headache persists or recurs. 10 tablet 5   No current facility-administered medications on  file prior to visit.    Review of Systems  Constitutional:  Negative for activity change, appetite change, fatigue, fever and unexpected weight change.  HENT:  Negative for congestion, rhinorrhea, sore throat and trouble swallowing.   Eyes:  Negative for pain, redness, itching and visual disturbance.  Respiratory:  Negative for apnea, cough, choking, chest tightness, shortness of breath, wheezing and stridor.   Cardiovascular:  Negative for chest pain and palpitations.  Gastrointestinal:  Positive for abdominal pain, constipation, nausea and vomiting. Negative for anal bleeding, blood in stool, diarrhea and rectal pain.  Endocrine: Negative for cold intolerance, heat intolerance, polydipsia and polyuria.  Genitourinary:  Negative for difficulty urinating, dysuria, flank pain, frequency and urgency.  Musculoskeletal:  Negative for arthralgias, joint swelling and myalgias.  Skin:  Negative for pallor and rash.  Neurological:  Negative for dizziness, tremors, weakness, numbness and headaches.  Hematological:  Negative for adenopathy. Does not bruise/bleed easily.  Psychiatric/Behavioral:  Negative for decreased concentration and dysphoric mood. The patient is not nervous/anxious.       Objective:   Physical Exam Constitutional:      General: He is not in acute distress.    Appearance: Normal appearance. He is well-developed and normal weight. He is not ill-appearing.  HENT:     Head: Normocephalic and atraumatic.     Mouth/Throat:     Mouth: Mucous membranes are moist.  Eyes:     Conjunctiva/sclera: Conjunctivae normal.     Pupils: Pupils are equal, round, and reactive to light.  Neck:     Thyroid: No thyromegaly.     Vascular: No carotid bruit or JVD.  Cardiovascular:     Rate and Rhythm: Normal rate and regular rhythm.     Heart sounds: Normal heart sounds.    No gallop.  Pulmonary:     Effort: Pulmonary effort is normal. No respiratory distress.     Breath sounds: Normal breath  sounds. No wheezing or rales.  Abdominal:     General: Abdomen is flat. Bowel sounds are normal. There is no distension or abdominal bruit. There are no signs of injury.     Palpations: Abdomen is soft. There is no hepatomegaly, splenomegaly, mass or pulsatile mass.     Tenderness: There is abdominal tenderness  in the right lower quadrant and left lower quadrant. There is no right CVA tenderness, left CVA tenderness, guarding or rebound. Negative signs include Murphy's sign and McBurney's sign.     Hernia: No hernia is present.     Comments: Tender in bilat LQ Moreso on the R  No rebound/guarding  No discomfort when shaking the table     Musculoskeletal:     Cervical back: Normal range of motion and neck supple.     Right lower leg: No edema.     Left lower leg: No edema.  Lymphadenopathy:     Cervical: No cervical adenopathy.  Skin:    General: Skin is warm and dry.     Coloration: Skin is not pale.     Findings: No rash.  Neurological:     Mental Status: He is alert.     Coordination: Coordination normal.     Deep Tendon Reflexes: Reflexes are normal and symmetric. Reflexes normal.  Psychiatric:        Mood and Affect: Mood normal.          Assessment & Plan:   Problem List Items Addressed This Visit       Digestive   GERD (gastroesophageal reflux disease)    Continues the protonix bid Unsure if his GERD contributes to these episodes No heartburn today but has had nausea (poss due to recurrent ibs)       Relevant Medications   glycopyrrolate (ROBINUL) 2 MG tablet   pantoprazole (PROTONIX) 40 MG tablet   IBS (irritable bowel syndrome) - Primary    Pt in the midst of episode with some constipation but loose small stools, cramping and low abd pain as well as nausea  Extensive last w/u reviewed including EGD, colonoscopy, CT abd pelvis and [place] abd  Also reviewed last GI report  He has been off robinul (px bid prn but he took regularly) and thinks this may be reason  for flare This was px to refill and start  (tolerates it well)  Enc to continue watching diet  Also keep up fluids  May try miralax if he feels constipated  Will adv GI f/u if not improved  inst to update if worse (ER if severe) or not improving      Relevant Medications   glycopyrrolate (ROBINUL) 2 MG tablet   pantoprazole (PROTONIX) 40 MG tablet     Other   Smoking    Pt wishes to quit (his stressors are not as bad) Has quit for 4 mo in past-vaped nicotine at that time  Wants to avoid vaping  Intol to chantix in the past  Smokes 1 1/2 ppd currently (more than prev) and has smoked since 35 yo  Discussed options for cessation  Will try nicotine inhaler-puff as directed as needed inst not to smoke while using  Goal to gradually cut # of inhalers per day to wean off nicotine Can also consider change to patch or gum in the future as well  Will plan f/u in mid feb when we are back in the regular office

## 2021-09-24 NOTE — Assessment & Plan Note (Signed)
Continues the protonix bid Unsure if his GERD contributes to these episodes No heartburn today but has had nausea (poss due to recurrent ibs)

## 2021-12-01 ENCOUNTER — Ambulatory Visit
Admission: RE | Admit: 2021-12-01 | Discharge: 2021-12-01 | Disposition: A | Discharge status: Home / Self Care | Payer: BC Managed Care – PPO | Source: Ambulatory Visit | Attending: Family | Admitting: Family

## 2021-12-01 ENCOUNTER — Encounter: Payer: Self-pay | Admitting: Family

## 2021-12-01 ENCOUNTER — Ambulatory Visit: Payer: BC Managed Care – PPO | Admitting: Family

## 2021-12-01 ENCOUNTER — Other Ambulatory Visit: Payer: Self-pay | Admitting: Family

## 2021-12-01 ENCOUNTER — Other Ambulatory Visit: Payer: Self-pay

## 2021-12-01 VITALS — BP 146/82 | HR 99 | Ht 68.0 in | Wt 192.0 lb

## 2021-12-01 DIAGNOSIS — R809 Proteinuria, unspecified: Secondary | ICD-10-CM

## 2021-12-01 DIAGNOSIS — M5441 Lumbago with sciatica, right side: Secondary | ICD-10-CM

## 2021-12-01 DIAGNOSIS — M62838 Other muscle spasm: Secondary | ICD-10-CM

## 2021-12-01 DIAGNOSIS — R300 Dysuria: Secondary | ICD-10-CM | POA: Insufficient documentation

## 2021-12-01 DIAGNOSIS — R109 Unspecified abdominal pain: Secondary | ICD-10-CM | POA: Diagnosis not present

## 2021-12-01 DIAGNOSIS — R1031 Right lower quadrant pain: Secondary | ICD-10-CM | POA: Insufficient documentation

## 2021-12-01 DIAGNOSIS — R1011 Right upper quadrant pain: Secondary | ICD-10-CM | POA: Diagnosis not present

## 2021-12-01 DIAGNOSIS — R10A1 Flank pain, right side: Secondary | ICD-10-CM | POA: Insufficient documentation

## 2021-12-01 LAB — CBC WITH DIFFERENTIAL/PLATELET
Absolute Monocytes: 784 cells/uL (ref 200–950)
Basophils Absolute: 82 cells/uL (ref 0–200)
Basophils Relative: 0.7 %
Eosinophils Absolute: 527 cells/uL — ABNORMAL HIGH (ref 15–500)
Eosinophils Relative: 4.5 %
HCT: 52.9 % — ABNORMAL HIGH (ref 38.5–50.0)
Hemoglobin: 18.5 g/dL — ABNORMAL HIGH (ref 13.2–17.1)
Lymphs Abs: 2668 cells/uL (ref 850–3900)
MCH: 30.6 pg (ref 27.0–33.0)
MCHC: 35 g/dL (ref 32.0–36.0)
MCV: 87.6 fL (ref 80.0–100.0)
MPV: 12.1 fL (ref 7.5–12.5)
Monocytes Relative: 6.7 %
Neutro Abs: 7640 cells/uL (ref 1500–7800)
Neutrophils Relative %: 65.3 %
Platelets: 228 10*3/uL (ref 140–400)
RBC: 6.04 10*6/uL — ABNORMAL HIGH (ref 4.20–5.80)
RDW: 13 % (ref 11.0–15.0)
Total Lymphocyte: 22.8 %
WBC: 11.7 10*3/uL — ABNORMAL HIGH (ref 3.8–10.8)

## 2021-12-01 LAB — POC URINALSYSI DIPSTICK (AUTOMATED)
Bilirubin, UA: NEGATIVE
Blood, UA: NEGATIVE
Glucose, UA: NEGATIVE
Ketones, UA: NEGATIVE
Leukocytes, UA: NEGATIVE
Nitrite, UA: NEGATIVE
Protein, UA: POSITIVE — AB
Spec Grav, UA: 1.015 (ref 1.010–1.025)
Urobilinogen, UA: 0.2 E.U./dL
pH, UA: 7 (ref 5.0–8.0)

## 2021-12-01 LAB — COMPLETE METABOLIC PANEL WITH GFR
AG Ratio: 1.8 (calc) (ref 1.0–2.5)
ALT: 37 U/L (ref 9–46)
AST: 20 U/L (ref 10–40)
Albumin: 4.9 g/dL (ref 3.6–5.1)
Alkaline phosphatase (APISO): 100 U/L (ref 36–130)
BUN: 9 mg/dL (ref 7–25)
CO2: 23 mmol/L (ref 20–32)
Calcium: 10 mg/dL (ref 8.6–10.3)
Chloride: 105 mmol/L (ref 98–110)
Creat: 1.16 mg/dL (ref 0.60–1.26)
Globulin: 2.8 g/dL (calc) (ref 1.9–3.7)
Glucose, Bld: 82 mg/dL (ref 65–99)
Potassium: 4.2 mmol/L (ref 3.5–5.3)
Sodium: 141 mmol/L (ref 135–146)
Total Bilirubin: 0.5 mg/dL (ref 0.2–1.2)
Total Protein: 7.7 g/dL (ref 6.1–8.1)
eGFR: 84 mL/min/{1.73_m2} (ref >=60)

## 2021-12-01 LAB — LIPASE: Lipase: 36 U/L (ref 7–60)

## 2021-12-01 LAB — COMPREHENSIVE METABOLIC PANEL
AG Ratio: 1.8 (calc) (ref 1.0–2.5)
ALT: 37 U/L (ref 9–46)
AST: 20 U/L (ref 10–40)
Albumin: 4.9 g/dL (ref 3.6–5.1)
Alkaline phosphatase (APISO): 100 U/L (ref 36–130)
BUN: 9 mg/dL (ref 7–25)
CO2: 23 mmol/L (ref 20–32)
Calcium: 10 mg/dL (ref 8.6–10.3)
Chloride: 105 mmol/L (ref 98–110)
Creat: 1.16 mg/dL (ref 0.60–1.26)
Globulin: 2.8 g/dL (calc) (ref 1.9–3.7)
Glucose, Bld: 82 mg/dL (ref 65–99)
Potassium: 4.2 mmol/L (ref 3.5–5.3)
Sodium: 141 mmol/L (ref 135–146)
Total Bilirubin: 0.5 mg/dL (ref 0.2–1.2)
Total Protein: 7.7 g/dL (ref 6.1–8.1)

## 2021-12-01 LAB — TEST AUTHORIZATION

## 2021-12-01 LAB — AMYLASE: Amylase: 39 U/L (ref 21–101)

## 2021-12-01 MED ORDER — IBUPROFEN 600 MG PO TABS: 600.0000 mg | ORAL_TABLET | Freq: Three times a day (TID) | ORAL | 0 refills | Status: AC | PRN

## 2021-12-01 MED ORDER — CYCLOBENZAPRINE HCL 10 MG PO TABS: 10.0000 mg | ORAL_TABLET | Freq: Three times a day (TID) | ORAL | 0 refills | Status: DC | PRN

## 2021-12-01 NOTE — Assessment & Plan Note (Addendum)
Ordering stat CT pending results.  Would like him to rule out appendicitis and or anything involving the liver or gallbladder.  Have renal protocol in place also due to flank pain to rule out kidney stone.  If any worsening pain patient advised to immediately go to the emergency room

## 2021-12-01 NOTE — Progress Notes (Signed)
Established Patient Office Visit  Subjective:  Patient ID: Donald Merritt, male    DOB: [birth year]  Age: 36 y.o. MRN: [id number]  CC:  Chief Complaint  Patient presents with   Pain    Pt stated having right side rib pain going down groin area.    HPI Donald Merritt is here today with concerns.   Donald Merritt is here today with concerns.    Last night started with symptoms that almost 'took me to my knees'. While watching tv started with sharp stabbing pain that was very painful 10/10. It stayed there for a few minutes, had to move around often to feel better. Dissipated a bit, but this am flaring again and very painful. No diarrhea no constipation. No fever/no chills. Some nausea over the last one week, not throwing up.    Does report some joint discomfort but thought maybe from right knee pain, but states more localized to the knee, does have a past trauma to knee. Feels it around front and back of knee.    Two weeks ago, started again with the burning and and right sided back pain, and took azo and resolved after taking.    One month ago started with dysuria and occasional right sided back pain that resolved after a few days. Does report he had a kidney stone years ago.    No worry for STD, no penile discharge.   Past Medical History:  Diagnosis Date   Allergy    Depression    Environmental allergies    GERD (gastroesophageal reflux disease)    Other and unspecified noninfectious gastroenteritis and colitis(558.9)    Other malaise and fatigue    Panic attack    Renal stones    Tobacco use disorder    Unspecified sleep apnea     Past Surgical History:  Procedure Laterality Date   neg hx      Family History  Problem Relation Age of Onset   Asthma Mother    Hypertension Father    Heart disease Paternal Grandmother    Diabetes Paternal Grandmother    Lactose intolerance Daughter    Bone cancer Paternal Grandfather    Colon cancer Neg Hx    Esophageal cancer  Neg Hx    Pancreatic cancer Neg Hx    Stomach cancer Neg Hx    Kidney disease Neg Hx    Liver disease Neg Hx     Social History   Socioeconomic History   Marital status: Married    Spouse name: Not on file   Number of children: 1   Years of education: Not on file   Highest education level: Not on file  Occupational History   Occupation: heating and air    Employer: [employer]  Tobacco Use   Smoking status: Former    Packs/day: 1.00    Years: 15.00    Pack years: 15.00    Types: Cigarettes    Quit date: 09/04/2020    Years since quitting: 1.2   Smokeless tobacco: Former    Types: Snuff    Quit date: 10/20/2011  Vaping Use   Vaping Use: Former  Substance and Sexual Activity   Alcohol use: Not Currently    Alcohol/week: 0.0 standard drinks   Drug use: No   Sexual activity: Not on file  Other Topics Concern   Not on file  Social History Narrative   Not on file   Social Determinants of Health  Financial Resource Strain: Not on file  Food Insecurity: Not on file  Transportation Needs: Not on file  Physical Activity: Not on file  Stress: Not on file  Social Connections: Not on file  Intimate Partner Violence: Not on file    Outpatient Medications Prior to Visit  Medication Sig Dispense Refill   acetaminophen (TYLENOL) 325 MG tablet Take 650 mg by mouth every 6 (six) hours as needed.     fluticasone (FLONASE) 50 MCG/ACT nasal spray Place 1 spray into both nostrils daily. 16 g 0   gabapentin (NEURONTIN) 300 MG capsule TAKE 1 CAPSULE BY MOUTH TWICE A DAY 60 capsule 3   glycopyrrolate (ROBINUL) 2 MG tablet Take 1 tablet (2 mg total) by mouth 2 (two) times daily. As needed for abdominal pain 180 tablet 1   loratadine (CLARITIN) 10 MG tablet Take 10 mg by mouth daily. 24 hours     nicotine (NICOTROL) 10 MG inhaler Inhale 1 Cartridge (1 continuous puffing total) into the lungs as needed for smoking cessation. 42 each 1   pantoprazole (PROTONIX) 40 MG tablet  Take 1 tablet (40 mg total) by mouth 2 (two) times daily. 180 tablet 3   sucralfate (CARAFATE) 1 g tablet TAKE 1 TABLET BY MOUTH FOUR TIMES A DAY WITH MEALS AND AT BEDTIME 360 tablet 1   SUMAtriptan (IMITREX) 100 MG tablet Take 1 tablet (100 mg total) by mouth once daily as needed. May repeat in 2 hours if headache persists or recurs. 10 tablet 5   No facility-administered medications prior to visit.    Allergies  Allergen Reactions   Chantix [Varenicline] Shortness Of Breath   Betadine [Povidone Iodine] Rash    ROS Review of Systems  Constitutional:  Positive for chills. Negative for fatigue and fever.  Respiratory:  Negative for shortness of breath and wheezing.   Cardiovascular:  Negative for chest pain and palpitations.  Gastrointestinal:  Positive for abdominal pain (ruq) and nausea. Negative for blood in stool, constipation, diarrhea and vomiting.  Genitourinary:  Positive for dysuria, flank pain (right sided) and frequency. Negative for decreased urine volume, penile discharge, testicular pain and urgency.     Objective:    Physical Exam Constitutional:      Appearance: Normal appearance. He is normal weight.  Cardiovascular:     Rate and Rhythm: Normal rate and regular rhythm.  Pulmonary:     Effort: Pulmonary effort is normal.     Breath sounds: Normal breath sounds.  Abdominal:     General: Abdomen is flat. Bowel sounds are normal.     Tenderness: There is abdominal tenderness in the right upper quadrant. Positive signs include psoas sign (right leg). Negative signs include Murphy's sign and McBurney's sign.  Musculoskeletal:     Thoracic back: Tenderness present.       Back:  Neurological:     Mental Status: He is alert.    BP (!) 146/82    Pulse 99    Ht 5\' 8"  (1.727 m)    Wt 192 lb (87.1 kg)    SpO2 100%    BMI 29.19 kg/m  Wt Readings from Last 3 Encounters:  12/01/21 192 lb (87.1 kg)  09/24/21 191 lb (86.6 kg)  10/28/20 196 lb 4 oz (89 kg)     Health  Maintenance Due  Topic Date Due   COVID-19 Vaccine (3 - Booster for Pfizer series) 02/21/2021   INFLUENZA VACCINE  05/19/2021    There are no preventive care reminders to display  for this patient.  Lab Results  Component Value Date   TSH 1.25 04/19/2020   Lab Results  Component Value Date   WBC 9.0 10/09/2020   HGB 16.6 10/09/2020   HCT 48.5 10/09/2020   MCV 86.9 10/09/2020   PLT 203.0 10/09/2020   Lab Results  Component Value Date   NA 138 10/09/2020   K 4.5 10/09/2020   CO2 31 10/09/2020   GLUCOSE 86 10/09/2020   BUN 14 10/09/2020   CREATININE 1.06 10/09/2020   BILITOT 0.4 10/09/2020   ALKPHOS 82 10/09/2020   AST 25 10/09/2020   ALT 44 10/09/2020   PROT 7.4 10/09/2020   ALBUMIN 4.7 10/09/2020   CALCIUM 9.9 10/09/2020   ANIONGAP 10 08/05/2020   GFR 90.76 10/09/2020   No results found for: HGBA1C    Assessment & Plan:   Problem List Items Addressed This Visit       Other   Acute right flank pain    Stat CT abdomen and pelvis without contrast with renal protocol suspected kidney stone on the right side.  Pending results.  I have stressed to patient that if any worsening pain to immediately go to the emergency room as I do highly suspect this is a kidney stone.      Relevant Orders   CBC w/Diff   Amylase   Comprehensive metabolic panel   Lipase   CT Abdomen Pelvis Wo Contrast   Dysuria - Primary    POCT urine and urine culture today pending results.      Relevant Orders   POCT Urinalysis Dipstick (Automated) (Completed)   Urine Culture   CT Abdomen Pelvis Wo Contrast   Right upper quadrant abdominal pain    Ordering stat CT pending results.  Would like him to rule out appendicitis and or anything involving the liver or gallbladder.  Have renal protocol in place also due to flank pain to rule out kidney stone.  If any worsening pain patient advised to immediately go to the emergency room      Relevant Orders   CT Abdomen Pelvis Wo Contrast   CBC  w/Diff   Amylase   Comprehensive metabolic panel   Lipase   CT Abdomen Pelvis Wo Contrast    No orders of the defined types were placed in this encounter.   Follow-up: Return if symptoms worsen or fail to improve.    Eugenia Pancoast, FNP

## 2021-12-01 NOTE — Patient Instructions (Signed)
Stop by the lab prior to leaving today. I will notify you of your results once received.   A diagnostic test was ordered for today, and requested to be performed at [facility].  I have sent the order over to the facility.  Please head over there now.   If pain worsens AT ALL go to the ER for suspected kidney stone.

## 2021-12-01 NOTE — Assessment & Plan Note (Signed)
POCT urine and urine culture today pending results.

## 2021-12-01 NOTE — Assessment & Plan Note (Signed)
Stat CT abdomen and pelvis without contrast with renal protocol suspected kidney stone on the right side.  Pending results.  I have stressed to patient that if any worsening pain to immediately go to the emergency room as I do highly suspect this is a kidney stone.

## 2021-12-02 ENCOUNTER — Telehealth: Payer: Self-pay | Admitting: Family Medicine

## 2021-12-02 ENCOUNTER — Encounter: Payer: Self-pay | Admitting: Family

## 2021-12-02 LAB — URINE CULTURE
MICRO NUMBER:: 13000561
Result:: NO GROWTH
SPECIMEN QUALITY:: ADEQUATE

## 2021-12-02 NOTE — Telephone Encounter (Signed)
Mrs. Donald Merritt called in and was checking on the results of the CT scan, due to he is in a lot of pain and wanted to know about a doctors note as well

## 2021-12-02 NOTE — Telephone Encounter (Signed)
Pt stated Donald Merritt explain lab results and letter pt [facility]

## 2021-12-02 NOTE — Progress Notes (Signed)
FYI Dr. Glori Bickers incidental finding of elevated hemoglobin hematocrit and white blood cells which seems to have been evidenced in the past, not overly concerning as patient also a smoker.  But I did want you to know in case you want to repeat his follow-up visit.  My Note: Called and spoke with patient and discussed results of the CT abdomen and pelvis which were unremarkable and could not find any relation to where his pain is coming from so likely muscular at this point.  Discussed with patient and advised him that I sent Flexeril to his pharmacy and patient to take Tylenol as needed I did send ibuprofen however with recent GI concerns please do not pick this medication up and just continue with Tylenol as needed.  The lab work also does not seem to explain the acute nature of his symptoms so likely muscular as well.  Incidentally there was some elevated eosinophils when asked the patient he does not have a rash and/or joint pains but he does sneeze occasionally and have allergies.  I am guessing that this is likely related to the elevated eosinophils at this point.  He also has mildly elevated hemoglobin hematocrit and white blood cells this is also been evidenced in the past between 1 and 2 years ago.  He is a smoker so may be white blood cells could be elevated from this.  I will relay to the primary care as well to maybe repeat this at his next visit and see if there is a trend upward or change.  If so may consider hematology consult.  Patient denies use of testosterone.  There was some protein in the urine so we are still pending the urine culture and I told him that we will let him know as soon as this comes forward.  In the meantime heat compresses Tylenol Flexeril as needed as suspected muscular in nature.  If there is still no improvement patient advised to please let me know immediately.

## 2022-03-29 ENCOUNTER — Other Ambulatory Visit: Payer: Self-pay | Admitting: Family Medicine

## 2022-03-31 NOTE — Telephone Encounter (Signed)
Reill Gabapentin Last refill 03/03/21 #60/3 Last office visit 12/01/21 acute

## 2022-04-01 ENCOUNTER — Emergency Department (HOSPITAL_COMMUNITY): Payer: BC Managed Care – PPO

## 2022-04-01 ENCOUNTER — Encounter (HOSPITAL_COMMUNITY): Payer: Self-pay

## 2022-04-01 ENCOUNTER — Emergency Department (HOSPITAL_COMMUNITY)
Admission: EM | Admit: 2022-04-01 | Discharge: 2022-04-01 | Disposition: A | Discharge status: Home / Self Care | Payer: BC Managed Care – PPO | Attending: Emergency Medicine | Admitting: Emergency Medicine

## 2022-04-01 ENCOUNTER — Other Ambulatory Visit: Payer: Self-pay

## 2022-04-01 DIAGNOSIS — R531 Weakness: Secondary | ICD-10-CM | POA: Insufficient documentation

## 2022-04-01 DIAGNOSIS — R110 Nausea: Secondary | ICD-10-CM | POA: Diagnosis not present

## 2022-04-01 DIAGNOSIS — R55 Syncope and collapse: Secondary | ICD-10-CM | POA: Diagnosis not present

## 2022-04-01 DIAGNOSIS — R5383 Other fatigue: Secondary | ICD-10-CM | POA: Diagnosis not present

## 2022-04-01 DIAGNOSIS — R42 Dizziness and giddiness: Secondary | ICD-10-CM | POA: Diagnosis not present

## 2022-04-01 DIAGNOSIS — R0600 Dyspnea, unspecified: Secondary | ICD-10-CM | POA: Diagnosis not present

## 2022-04-01 DIAGNOSIS — F17200 Nicotine dependence, unspecified, uncomplicated: Secondary | ICD-10-CM | POA: Insufficient documentation

## 2022-04-01 DIAGNOSIS — R002 Palpitations: Secondary | ICD-10-CM | POA: Insufficient documentation

## 2022-04-01 LAB — COMPREHENSIVE METABOLIC PANEL
ALT: 43 U/L (ref 0–44)
AST: 34 U/L (ref 15–41)
Albumin: 4.5 g/dL (ref 3.5–5.0)
Alkaline Phosphatase: 77 U/L (ref 38–126)
Anion gap: 10 (ref 5–15)
BUN: 12 mg/dL (ref 6–20)
CO2: 22 mmol/L (ref 22–32)
Calcium: 9.6 mg/dL (ref 8.9–10.3)
Chloride: 106 mmol/L (ref 98–111)
Creatinine, Ser: 1.03 mg/dL (ref 0.61–1.24)
GFR, Estimated: >60 mL/min (ref >60)
Glucose, Bld: 98 mg/dL (ref 70–99)
Potassium: 4.3 mmol/L (ref 3.5–5.1)
Sodium: 138 mmol/L (ref 135–145)
Total Bilirubin: 0.9 mg/dL (ref 0.3–1.2)
Total Protein: 6.9 g/dL (ref 6.5–8.1)

## 2022-04-01 LAB — CBC WITH DIFFERENTIAL/PLATELET
Abs Immature Granulocytes: 0.02 10*3/uL (ref 0.00–0.07)
Basophils Absolute: 0.1 10*3/uL (ref 0.0–0.1)
Basophils Relative: 1 %
Eosinophils Absolute: 0.4 10*3/uL (ref 0.0–0.5)
Eosinophils Relative: 4 %
HCT: 46.9 % (ref 39.0–52.0)
Hemoglobin: 16.6 g/dL (ref 13.0–17.0)
Immature Granulocytes: 0 %
Lymphocytes Relative: 29 %
Lymphs Abs: 2.5 10*3/uL (ref 0.7–4.0)
MCH: 30 pg (ref 26.0–34.0)
MCHC: 35.4 g/dL (ref 30.0–36.0)
MCV: 84.7 fL (ref 80.0–100.0)
Monocytes Absolute: 0.6 10*3/uL (ref 0.1–1.0)
Monocytes Relative: 7 %
Neutro Abs: 5.1 10*3/uL (ref 1.7–7.7)
Neutrophils Relative %: 59 %
Platelets: 216 10*3/uL (ref 150–400)
RBC: 5.54 MIL/uL (ref 4.22–5.81)
RDW: 11.7 % (ref 11.5–15.5)
WBC: 8.7 10*3/uL (ref 4.0–10.5)
nRBC: 0 % (ref 0.0–0.2)

## 2022-04-01 LAB — TROPONIN I (HIGH SENSITIVITY)
Troponin I (High Sensitivity): 4 ng/L (ref <18)
Troponin I (High Sensitivity): 6 ng/L (ref <18)

## 2022-04-01 MED ORDER — ONDANSETRON HCL 4 MG/2ML IJ SOLN
4.0000 mg | Freq: Once | INTRAMUSCULAR | Status: AC
  Administered 2022-04-01: 4 mg via INTRAVENOUS
  Filled 2022-04-01: qty 2

## 2022-04-01 MED ORDER — MECLIZINE HCL 25 MG PO TABS
25.0000 mg | ORAL_TABLET | Freq: Once | ORAL | Status: AC
  Administered 2022-04-01: 25 mg via ORAL
  Filled 2022-04-01: qty 1

## 2022-04-01 MED ORDER — MECLIZINE HCL 25 MG PO TABS: 25.0000 mg | ORAL_TABLET | Freq: Three times a day (TID) | ORAL | 0 refills | Status: DC | PRN

## 2022-04-01 MED ORDER — SODIUM CHLORIDE 0.9 % IV BOLUS
1000.0000 mL | Freq: Once | INTRAVENOUS | Status: AC
  Administered 2022-04-01: 1000 mL via INTRAVENOUS

## 2022-04-01 NOTE — ED Provider Triage Note (Signed)
Emergency Medicine Provider Triage Evaluation Note  Donald Merritt , a 36 y.o. male  was evaluated in triage.  Pt complains of lightheadedness/dizziness onset earlier today while at work.  Denies any changes in hydration status over the past few days.  States his symptoms are constant even at rest.  Reports some pain in his right shoulder blade earlier associated with shortness of breath.  Denies other complaints.  He is without chest pain.  Review of Systems  Positive: As above Negative: As above  Physical Exam  BP (!) 152/116 (BP Location: Right Arm)   Pulse 79   Temp 97.8 F (36.6 C) (Oral)   Resp 16   Ht '5\' 9"'$  (1.753 m)   Wt 81.6 kg   SpO2 98%   BMI 26.58 kg/m  Gen:   Awake, no distress  Resp:  Normal effort  MSK:   Moves extremities without difficulty  Other:    Medical Decision Making  Medically screening exam initiated at 1:07 PM.  Appropriate orders placed.  Donald Merritt was informed that the remainder of the evaluation will be completed by another provider, this initial triage assessment does not replace that evaluation, and the importance of remaining in the ED until their evaluation is complete.     Donald Courier, PA-C 04/01/22 1308

## 2022-04-01 NOTE — ED Triage Notes (Addendum)
Pt arrived POV from work c/o a near syncopal episode. Pt states he got nauseous and lightheaded, attempted to go to the bathroom and felt like he was going to fall out and came here. Pt also endorses left sided chest pain.

## 2022-04-01 NOTE — ED Provider Notes (Signed)
[facility] EMERGENCY DEPARTMENT Provider Note   CSN: [id number] Arrival date & time: 04/01/22  1242    History  Chief Complaint  Patient presents with   Near Syncope    Donald Merritt is a 36 y.o. male hx of recurrent HA, tobacco use here for evaluation of lightheadedness. States was at work when he felt lightheaded and dizzy. Felt nauseous like he was going to throw up. No sudden onset HA. Felt like the room was spinning. No hx of vertigo. No facial droop, numbness, unilateral weakness.  Just feels weak all over and fatigued.  No chest pain, shortness of breath.  Does occasionally have palpitations however none over the last week.  No history of SAH, PE, DVT.  No recent surgery, immobilization or malignancy   HPI     Home Medications Prior to Admission medications   Medication Sig Start Date End Date Taking? Authorizing Provider  meclizine (ANTIVERT) 25 MG tablet Take 1 tablet (25 mg total) by mouth 3 (three) times daily as needed for dizziness. 04/01/22  Yes Kellianne Ek A, PA-C  acetaminophen (TYLENOL) 325 MG tablet Take 650 mg by mouth every 6 (six) hours as needed.    [provider]  cyclobenzaprine (FLEXERIL) 10 MG tablet Take 1 tablet (10 mg total) by mouth 3 (three) times daily as needed for muscle spasms. 12/01/21   Eugenia Pancoast, FNP  fluticasone (FLONASE) 50 MCG/ACT nasal spray Place 1 spray into both nostrils daily. 11/27/17   Caccavale, Sophia, PA-C  gabapentin (NEURONTIN) 300 MG capsule TAKE 1 CAPSULE BY MOUTH TWICE A DAY 03/31/22   Tower, Wynelle Fanny, MD  glycopyrrolate (ROBINUL) 2 MG tablet Take 1 tablet (2 mg total) by mouth 2 (two) times daily. As needed for abdominal pain 09/24/21   Tower, Roque Lias A, MD  loratadine (CLARITIN) 10 MG tablet Take 10 mg by mouth daily. 24 hours    [provider]  nicotine (NICOTROL) 10 MG inhaler Inhale 1 Cartridge (1 continuous puffing total) into the lungs as needed for smoking cessation. 09/24/21    Tower, Wynelle Fanny, MD  pantoprazole (PROTONIX) 40 MG tablet Take 1 tablet (40 mg total) by mouth 2 (two) times daily. 09/24/21   Tower, Wynelle Fanny, MD  sucralfate (CARAFATE) 1 g tablet TAKE 1 TABLET BY MOUTH FOUR TIMES A DAY WITH MEALS AND AT BEDTIME 12/17/20   Tower, Wynelle Fanny, MD  SUMAtriptan (IMITREX) 100 MG tablet Take 1 tablet (100 mg total) by mouth once daily as needed. May repeat in 2 hours if headache persists or recurs. 05/12/21   Tower, Wynelle Fanny, MD      Allergies    Chantix [varenicline] and Betadine [povidone iodine]    Review of Systems   Review of Systems  Constitutional: Negative.   HENT: Negative.    Respiratory: Negative.    Cardiovascular: Negative.   Gastrointestinal: Negative.   Genitourinary: Negative.   Musculoskeletal: Negative.   Skin: Negative.   Neurological:  Positive for weakness and light-headedness. Negative for dizziness, tremors, seizures, syncope, speech difficulty, numbness and headaches.  All other systems reviewed and are negative.   Physical Exam Updated Vital Signs BP (!) 146/108 (BP Location: Right Arm)   Pulse 69   Temp 98.4 F (36.9 C) (Oral)   Resp 16   Ht '5\' 9"'$  (1.753 m)   Wt 81.6 kg   SpO2 99%   BMI 26.58 kg/m  Physical Exam Physical Exam  Constitutional: Pt is oriented to person, place, and time.  Pt appears well-developed and well-nourished. No distress.  HENT:  Head: Normocephalic and atraumatic.  Mouth/Throat: Oropharynx is clear and moist.  Eyes: Conjunctivae and EOM are normal. Pupils are equal, round, and reactive to light. No scleral icterus.  No horizontal, vertical or rotational nystagmus  Neck: Normal range of motion. Neck supple.  Full active and passive ROM without pain No midline or paraspinal tenderness No nuchal rigidity or meningeal signs  Cardiovascular: Normal rate, regular rhythm and intact distal pulses.   Pulmonary/Chest: Effort normal and breath sounds normal. No respiratory distress. Pt has no wheezes. No rales.   Abdominal: Soft. Bowel sounds are normal. There is no tenderness. There is no rebound and no guarding.  Musculoskeletal: Normal range of motion.  Lymphadenopathy:    No cervical adenopathy.  Neurological: Pt. is alert and oriented to person, place, and time. He has normal reflexes. No cranial nerve deficit.  Exhibits normal muscle tone. Coordination normal.  Mental Status:  Alert, oriented, thought content appropriate. Speech fluent without evidence of aphasia. Able to follow 2 step commands without difficulty.  Cranial Nerves:  II:  Peripheral visual fields grossly normal, pupils equal, round, reactive to light III,IV, VI: ptosis not present, extra-ocular motions intact bilaterally  V,VII: smile symmetric, facial light touch sensation equal VIII: hearing grossly normal bilaterally  IX,X: midline uvula rise  XI: bilateral shoulder shrug equal and strong XII: midline tongue extension  Motor:  5/5 in upper and lower extremities bilaterally including strong and equal grip strength and dorsiflexion/plantar flexion Sensory: Pinprick and light touch normal in all extremities.  Deep Tendon Reflexes: 2+ and symmetric  Cerebellar: normal finger-to-nose with bilateral upper extremities Gait: normal gait and balance CV: distal pulses palpable throughout   Skin: Skin is warm and dry. No rash noted. Pt is not diaphoretic.  Psychiatric: Pt has a normal mood and affect. Behavior is normal. Judgment and thought content normal.  Nursing note and vitals reviewed.  ED Results / Procedures / Treatments   Labs (all labs ordered are listed, but only abnormal results are displayed) Labs Reviewed  CBC WITH DIFFERENTIAL/PLATELET  COMPREHENSIVE METABOLIC PANEL  TROPONIN I (HIGH SENSITIVITY)  TROPONIN I (HIGH SENSITIVITY)    EKG EKG Interpretation  Date/Time:  Wednesday April 01 2022 12:57:38 EDT Ventricular Rate:  69 PR Interval:  128 QRS Duration: 98 QT Interval:  374 QTC Calculation: 400 R  Axis:   76 Text Interpretation: Normal sinus rhythm Normal ECG When compared with ECG of 10-Apr-2019 10:06, PREVIOUS ECG IS PRESENT Confirmed by Lennice Sites ([id number]) on 04/01/2022 7:20:37 PM  Radiology CT HEAD WO CONTRAST (5MM)  Result Date: 04/01/2022 CLINICAL DATA:  Syncope/presyncope, cerebrovascular cause suspected EXAM: CT HEAD WITHOUT CONTRAST TECHNIQUE: Contiguous axial images were obtained from the base of the skull through the vertex without intravenous contrast. RADIATION DOSE REDUCTION: This exam was performed according to the departmental dose-optimization program which includes automated exposure control, adjustment of the mA and/or kV according to patient size and/or use of iterative reconstruction technique. COMPARISON:  None Available. FINDINGS: Brain: Normal anatomic configuration. No abnormal intra or extra-axial mass lesion or fluid collection. No abnormal mass effect or midline shift. No evidence of acute intracranial hemorrhage or infarct. Ventricular size is normal. Cerebellum unremarkable. Vascular: Unremarkable Skull: Intact Sinuses/Orbits: Paranasal sinuses are clear. Orbits are unremarkable. Other: Mastoid air cells and middle ear cavities are clear. IMPRESSION: No acute intracranial abnormality. Electronically Signed   By: Fidela Salisbury M.D.   On: 04/01/2022 20:55   DG Chest 2  View  Result Date: 04/01/2022 CLINICAL DATA:  36 year old male presenting with dyspnea and lightheadedness with nausea. EXAM: CHEST - 2 VIEW COMPARISON:  April 10, 2019. FINDINGS: The heart size and mediastinal contours are within normal limits. Both lungs are clear. The visualized skeletal structures are unremarkable. IMPRESSION: No active cardiopulmonary disease. Electronically Signed   By: Zetta Bills M.D.   On: 04/01/2022 13:24    Procedures Procedures    Medications Ordered in ED Medications  sodium chloride 0.9 % bolus 1,000 mL (1,000 mLs Intravenous New Bag/Given 04/01/22 1853)  ondansetron  (ZOFRAN) injection 4 mg (4 mg Intravenous Given 04/01/22 1853)  meclizine (ANTIVERT) tablet 25 mg (25 mg Oral Given 04/01/22 1902)    ED Course/ Medical Decision Making/ A&P    36 year old here for evaluation of near syncope. Felt lightheaded and dizzy at work. 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 No CP, SOB, sudden onset thunderclap HA. He has non focal neuro exam without deficits. PERC neg, wells low risk. Abd soft non tender. Heart and lungs clear. Admits to some dizziness currently with reassuring neuro exam. Plan on labs, imaging and sx management.  Labs and imaging personally viewed and interpreted:  CBC without leukocytosis Trop 4>> 6 Cmp without acute abnormality DG chest without infiltrates, Pneumothorax, pulm edema, cardiomegaly EKG without ischemic CT head without acute abnormality   Patient reassessed. Given IVF.  He is have a nonfocal neuro exam without deficit.  Discussed reassuring labs, imaging as well as exam.  Suspect likely multifactorial.  At this time suspicion for acute ACS, PE, dissection, CVA, bacterial infectious process, SAH.  Encourage rest, hydration, follow-up outpatient, return for new worsening symptoms.  Patient agreeable.  The patient has been appropriately medically screened and/or stabilized in the ED. I have low suspicion for any other emergent medical condition which would require further screening, evaluation or treatment in the ED or require inpatient management.  Patient is hemodynamically stable and in no acute distress.  Patient able to ambulate in department prior to ED.  Evaluation does not show acute pathology that would require ongoing or additional emergent interventions while in the emergency department or further inpatient treatment.  I have discussed the diagnosis with the patient and answered all questions.  Pain is been managed while in the emergency department and patient has no further complaints prior to discharge.   Patient is comfortable with plan discussed in room and is stable for discharge at this time.  I have discussed strict return precautions for returning to the emergency department.  Patient was encouraged to follow-up with PCP/specialist refer to at discharge.                             Medical Decision Making Amount and/or Complexity of Data Reviewed Independent Historian: spouse External Data Reviewed: labs, radiology, ECG and notes. Labs: ordered. Decision-making details documented in ED Course. Radiology: ordered and independent interpretation performed. Decision-making details documented in ED Course. ECG/medicine tests: ordered and independent interpretation performed. Decision-making details documented in ED Course.  Risk OTC drugs. Prescription drug management. Parenteral controlled substances. Diagnosis or treatment significantly limited by social determinants of health.         Final Clinical Impression(s) / ED Diagnoses Final diagnoses:  Near syncope    Rx / DC Orders ED Discharge Orders          Ordered    meclizine (ANTIVERT) 25  MG tablet  3 times daily PRN        04/01/22 2125              Cadell Gabrielson A, PA-C 04/01/22 2130    Lennice Sites, DO 04/01/22 2218

## 2022-04-01 NOTE — Discharge Instructions (Addendum)
Make sure to rest, hydrate, follow-up with primary care provider  Return for new or worsening symptoms

## 2022-05-01 ENCOUNTER — Other Ambulatory Visit: Payer: Self-pay | Admitting: Family Medicine

## 2022-06-14 ENCOUNTER — Other Ambulatory Visit: Payer: Self-pay | Admitting: Family Medicine

## 2022-06-29 ENCOUNTER — Ambulatory Visit (INDEPENDENT_AMBULATORY_CARE_PROVIDER_SITE_OTHER): Payer: BC Managed Care – PPO | Admitting: Family

## 2022-06-29 ENCOUNTER — Encounter: Payer: Self-pay | Admitting: Family

## 2022-06-29 VITALS — BP 126/88 | HR 94 | Temp 98.8°F | Ht 69.0 in | Wt 199.0 lb

## 2022-06-29 DIAGNOSIS — U071 COVID-19: Secondary | ICD-10-CM | POA: Diagnosis not present

## 2022-06-29 DIAGNOSIS — R051 Acute cough: Secondary | ICD-10-CM | POA: Diagnosis not present

## 2022-06-29 DIAGNOSIS — Z20822 Contact with and (suspected) exposure to covid-19: Secondary | ICD-10-CM | POA: Insufficient documentation

## 2022-06-29 LAB — POC COVID19 BINAXNOW: SARS Coronavirus 2 Ag: POSITIVE — AB

## 2022-06-29 MED ORDER — MOLNUPIRAVIR EUA 200MG CAPSULE: 4.0000 | ORAL_CAPSULE | Freq: Two times a day (BID) | ORAL | 0 refills | Status: AC

## 2022-06-29 NOTE — Patient Instructions (Signed)
Advised of CDC guidelines for self isolation/ ending isolation.  Advised of safe practice guidelines. Symptom Tier reviewed.  Encouraged to monitor for any worsening symptoms; watch for increased shortness of breath, weakness, and signs of dehydration. Advised when to seek emergency care.  Instructed to rest and hydrate well.  Advised to leave the house during recommended isolation period, only if it is necessary to seek medical care   Regards,   Eugenia Pancoast FNP-C

## 2022-06-29 NOTE — Assessment & Plan Note (Addendum)
Advised of CDC guidelines for self isolation/ ending isolation.  Advised of safe practice guidelines. Symptom Tier reviewed.  Encouraged to monitor for any worsening symptoms; watch for increased shortness of breath, weakness, and signs of dehydration. Advised when to seek emergency care.  Instructed to rest and hydrate well.  Advised to leave the house during recommended isolation period, only if it is necessary to seek medical care  Pt considered high risk for hospitalization. have decided pt is a candidate for antiviral and pt agrees that she would like to take this. I have sent in RX for molnupiravir 200 mg capsules to be taken as directed.  Work note given to pt, isolation guidelines d/w pt

## 2022-06-29 NOTE — Assessment & Plan Note (Signed)
covid tested in office, positive.

## 2022-06-29 NOTE — Assessment & Plan Note (Signed)
otc robitussin prn

## 2022-06-29 NOTE — Progress Notes (Signed)
Established Patient Office Visit  Subjective:  Patient ID: Donald Merritt, male    DOB: [birth year]  Age: 36 y.o. MRN: [id number]  CC:  Chief Complaint  Patient presents with   Cough    Sore throat, diarrhea, cold chills. Symptoms started 9/10. Daughter positive 9/11    HPI Donald Merritt is here today with concerns.   Last night started with sore throat, cough, diarrhea this am, chills  Daughter home tested today and was positive.  No fever. Cough is dry non productive.   Past Medical History:  Diagnosis Date   Allergy    Depression    Environmental allergies    GERD (gastroesophageal reflux disease)    Other and unspecified noninfectious gastroenteritis and colitis(558.9)    Other malaise and fatigue    Panic attack    Renal stones    Tobacco use disorder    Unspecified sleep apnea     Past Surgical History:  Procedure Laterality Date   neg hx      Family History  Problem Relation Age of Onset   Asthma Mother    Hypertension Father    Heart disease Paternal Grandmother    Diabetes Paternal Grandmother    Lactose intolerance Daughter    Bone cancer Paternal Grandfather    Colon cancer Neg Hx    Esophageal cancer Neg Hx    Pancreatic cancer Neg Hx    Stomach cancer Neg Hx    Kidney disease Neg Hx    Liver disease Neg Hx     Social History   Socioeconomic History   Marital status: Married    Spouse name: Not on file   Number of children: 1   Years of education: Not on file   Highest education level: Not on file  Occupational History   Occupation: heating and air    Employer: [employer]  Tobacco Use   Smoking status: Former    Packs/day: 1.00    Years: 15.00    Total pack years: 15.00    Types: Cigarettes    Quit date: 09/04/2020    Years since quitting: 1.8   Smokeless tobacco: Former    Types: Snuff    Quit date: 10/20/2011  Vaping Use   Vaping Use: Former  Substance and Sexual Activity   Alcohol use: Not Currently     Alcohol/week: 0.0 standard drinks of alcohol   Drug use: No   Sexual activity: Not on file  Other Topics Concern   Not on file  Social History Narrative   Not on file   Social Determinants of Health   Financial Resource Strain: Not on file  Food Insecurity: Not on file  Transportation Needs: Not on file  Physical Activity: Not on file  Stress: Not on file  Social Connections: Not on file  Intimate Partner Violence: Not on file    Outpatient Medications Prior to Visit  Medication Sig Dispense Refill   acetaminophen (TYLENOL) 325 MG tablet Take 650 mg by mouth every 6 (six) hours as needed.     fluticasone (FLONASE) 50 MCG/ACT nasal spray Place 1 spray into both nostrils daily. 16 g 0   gabapentin (NEURONTIN) 300 MG capsule TAKE 1 CAPSULE BY MOUTH TWICE A DAY 60 capsule 3   glycopyrrolate (ROBINUL) 2 MG tablet TAKE 1 TABLET (2 MG TOTAL) BY MOUTH 2 (TWO) TIMES DAILY. AS NEEDED FOR ABDOMINAL PAIN 180 tablet 1   loratadine (CLARITIN) 10 MG tablet Take 10 mg by mouth  daily. 24 hours     meclizine (ANTIVERT) 25 MG tablet Take 1 tablet (25 mg total) by mouth 3 (three) times daily as needed for dizziness. 30 tablet 0   nicotine (NICOTROL) 10 MG inhaler Inhale 1 Cartridge (1 continuous puffing total) into the lungs as needed for smoking cessation. 42 each 1   pantoprazole (PROTONIX) 40 MG tablet Take 1 tablet (40 mg total) by mouth 2 (two) times daily. 180 tablet 3   sucralfate (CARAFATE) 1 g tablet TAKE 1 TABLET BY MOUTH FOUR TIMES A DAY WITH MEALS AND AT BEDTIME 360 tablet 1   SUMAtriptan (IMITREX) 100 MG tablet TAKE 1 TABLET BY MOUTH DAILY AS NEEDED. MAY REPEAT IN 2 HOURS IF HEADACHE PERSISTS OR RECURS. 10 tablet 5   cyclobenzaprine (FLEXERIL) 10 MG tablet Take 1 tablet (10 mg total) by mouth 3 (three) times daily as needed for muscle spasms. (Patient not taking: Reported on 06/29/2022) 30 tablet 0   No facility-administered medications prior to visit.    Allergies  Allergen Reactions    Chantix [Varenicline] Shortness Of Breath   Betadine [Povidone Iodine] Rash        Objective:    Physical Exam Constitutional:      General: He is awake. He is not in acute distress.    Appearance: Normal appearance. He is normal weight. He is not ill-appearing.  HENT:     Right Ear: Tympanic membrane normal.     Left Ear: Tympanic membrane normal.     Nose: Nose normal.     Right Turbinates: Not enlarged or swollen.     Left Turbinates: Not enlarged or swollen.     Right Sinus: No maxillary sinus tenderness or frontal sinus tenderness.     Left Sinus: No maxillary sinus tenderness or frontal sinus tenderness.     Mouth/Throat:     Mouth: Mucous membranes are moist.     Pharynx: No pharyngeal swelling, oropharyngeal exudate or posterior oropharyngeal erythema.  Eyes:     Extraocular Movements: Extraocular movements intact.     Pupils: Pupils are equal, round, and reactive to light.  Cardiovascular:     Rate and Rhythm: Normal rate and regular rhythm.  Pulmonary:     Effort: Pulmonary effort is normal.     Breath sounds: Normal breath sounds. No wheezing.  Neurological:     General: No focal deficit present.     Mental Status: He is alert and oriented to person, place, and time. Mental status is at baseline.  Psychiatric:        Mood and Affect: Mood normal.        Behavior: Behavior normal.        Thought Content: Thought content normal.        Judgment: Judgment normal.     BP 126/88   Pulse 94   Temp 98.8 F (37.1 C) (Temporal)   Ht $R'5\' 9"'dr$  (1.753 m)   Wt 199 lb (90.3 kg)   SpO2 99%   BMI 29.39 kg/m  Wt Readings from Last 3 Encounters:  06/29/22 199 lb (90.3 kg)  04/01/22 180 lb (81.6 kg)  12/01/21 192 lb (87.1 kg)     There are no preventive care reminders to display for this patient.  There are no preventive care reminders to display for this patient.  Lab Results  Component Value Date   TSH 1.25 04/19/2020   Lab Results  Component Value Date    WBC 8.7 04/01/2022   HGB 16.6 04/01/2022  HCT 46.9 04/01/2022   MCV 84.7 04/01/2022   PLT 216 04/01/2022   Lab Results  Component Value Date   NA 138 04/01/2022   K 4.3 04/01/2022   CO2 22 04/01/2022   GLUCOSE 98 04/01/2022   BUN 12 04/01/2022   CREATININE 1.03 04/01/2022   BILITOT 0.9 04/01/2022   ALKPHOS 77 04/01/2022   AST 34 04/01/2022   ALT 43 04/01/2022   PROT 6.9 04/01/2022   ALBUMIN 4.5 04/01/2022   CALCIUM 9.6 04/01/2022   ANIONGAP 10 04/01/2022   EGFR 84 12/01/2021   GFR 90.76 10/09/2020   No results found for: "HGBA1C"    Assessment & Plan:   Problem List Items Addressed This Visit       Other   Acute COVID-19    Advised of CDC guidelines for self isolation/ ending isolation.  Advised of safe practice guidelines. Symptom Tier reviewed.  Encouraged to monitor for any worsening symptoms; watch for increased shortness of breath, weakness, and signs of dehydration. Advised when to seek emergency care.  Instructed to rest and hydrate well.  Advised to leave the house during recommended isolation period, only if it is necessary to seek medical care  Pt considered high risk for hospitalization. have decided pt is a candidate for antiviral and pt agrees that she would like to take this. I have sent in RX for molnupiravir 200 mg capsules to be taken as directed.  Work note given to pt, isolation guidelines d/w pt        Relevant Medications   molnupiravir EUA (LAGEVRIO) 200 mg CAPS capsule   Close exposure to COVID-19 virus - Primary    covid tested in office, positive.       Acute cough    otc robitussin prn       Relevant Orders   POC COVID-19 (Completed)    Meds ordered this encounter  Medications   molnupiravir EUA (LAGEVRIO) 200 mg CAPS capsule    Sig: Take 4 capsules (800 mg total) by mouth 2 (two) times daily for 5 days.    Dispense:  40 capsule    Refill:  0    Order Specific Question:   Supervising Provider    Answer:   BEDSOLE, AMY E [[id number]]     Follow-up: Return if symptoms worsen or fail to improve with pcp.    Eugenia Pancoast, FNP

## 2022-07-15 ENCOUNTER — Ambulatory Visit: Payer: BC Managed Care – PPO | Admitting: Family

## 2022-07-15 ENCOUNTER — Encounter: Payer: Self-pay | Admitting: Family

## 2022-07-15 ENCOUNTER — Ambulatory Visit (INDEPENDENT_AMBULATORY_CARE_PROVIDER_SITE_OTHER)
Admission: RE | Admit: 2022-07-15 | Discharge: 2022-07-15 | Disposition: A | Discharge status: Home / Self Care | Payer: BC Managed Care – PPO | Source: Ambulatory Visit | Attending: Family | Admitting: Family

## 2022-07-15 VITALS — BP 138/72 | HR 101 | Temp 99.0°F | Wt 202.2 lb

## 2022-07-15 DIAGNOSIS — R0602 Shortness of breath: Secondary | ICD-10-CM

## 2022-07-15 DIAGNOSIS — H65191 Other acute nonsuppurative otitis media, right ear: Secondary | ICD-10-CM | POA: Diagnosis not present

## 2022-07-15 DIAGNOSIS — H6690 Otitis media, unspecified, unspecified ear: Secondary | ICD-10-CM | POA: Insufficient documentation

## 2022-07-15 MED ORDER — PREDNISONE 20 MG PO TABS: ORAL_TABLET | ORAL | 0 refills | Status: DC

## 2022-07-15 MED ORDER — AMOXICILLIN-POT CLAVULANATE 875-125 MG PO TABS: 1.0000 | ORAL_TABLET | Freq: Two times a day (BID) | ORAL | 0 refills | Status: DC

## 2022-07-15 NOTE — Assessment & Plan Note (Addendum)
Stat chest x-ray today to rule out pneumonia pending results Rx prednisone 20 mg Advised if shortness of breath is to worsen please go to the emergency room or call 911

## 2022-07-15 NOTE — Assessment & Plan Note (Signed)
rx augmentin 875/125 mg po bid x 10 days Take antibiotic as prescribed. Increase oral fluids. Pt to f/u if sx worsen and or fail to improve in 2-3 days.  

## 2022-07-15 NOTE — Progress Notes (Signed)
Established Patient Office Visit  Subjective:  Patient ID: Donald Merritt, male    DOB: [birth year]  Age: 36 y.o. MRN: [id number]  CC:  Chief Complaint  Patient presents with   Cough   Nasal Congestion   Chills    Pos covid 9/11 symptoms started 9/22   Sore Throat         HPI Donald Merritt is here today for follow up.  Tested positive for covid 9/11.  Noticed around five days ago with increased post nasal drip and runny nose and now increased fatigue and harder to breath with chest congestion. He feels pain bil lungs as well.   Took complete antiviral molnupiravir.  Using vicks over the counter and taking mucinex and flonase. No real relief.    Past Medical History:  Diagnosis Date   Allergy    Depression    Environmental allergies    GERD (gastroesophageal reflux disease)    Other and unspecified noninfectious gastroenteritis and colitis(558.9)    Other malaise and fatigue    Panic attack    Renal stones    Tobacco use disorder    Unspecified sleep apnea     Past Surgical History:  Procedure Laterality Date   neg hx      Family History  Problem Relation Age of Onset   Asthma Mother    Hypertension Father    Heart disease Paternal Grandmother    Diabetes Paternal Grandmother    Lactose intolerance Daughter    Bone cancer Paternal Grandfather    Colon cancer Neg Hx    Esophageal cancer Neg Hx    Pancreatic cancer Neg Hx    Stomach cancer Neg Hx    Kidney disease Neg Hx    Liver disease Neg Hx     Social History   Socioeconomic History   Marital status: Married    Spouse name: Not on file   Number of children: 1   Years of education: Not on file   Highest education level: Not on file  Occupational History   Occupation: heating and air    Employer: [employer]  Tobacco Use   Smoking status: Former    Packs/day: 1.00    Years: 15.00    Total pack years: 15.00    Types: Cigarettes    Quit date: 09/04/2020    Years since  quitting: 1.8   Smokeless tobacco: Former    Types: Snuff    Quit date: 10/20/2011  Vaping Use   Vaping Use: Former  Substance and Sexual Activity   Alcohol use: Not Currently    Alcohol/week: 0.0 standard drinks of alcohol   Drug use: No   Sexual activity: Not on file  Other Topics Concern   Not on file  Social History Narrative   Not on file   Social Determinants of Health   Financial Resource Strain: Not on file  Food Insecurity: Not on file  Transportation Needs: Not on file  Physical Activity: Not on file  Stress: Not on file  Social Connections: Not on file  Intimate Partner Violence: Not on file    Outpatient Medications Prior to Visit  Medication Sig Dispense Refill   fluticasone (FLONASE) 50 MCG/ACT nasal spray Place 1 spray into both nostrils daily. 16 g 0   gabapentin (NEURONTIN) 300 MG capsule TAKE 1 CAPSULE BY MOUTH TWICE A DAY 60 capsule 3   glycopyrrolate (ROBINUL) 2 MG tablet TAKE 1 TABLET (2 MG TOTAL) BY MOUTH 2 (TWO)  TIMES DAILY. AS NEEDED FOR ABDOMINAL PAIN 180 tablet 1   loratadine (CLARITIN) 10 MG tablet Take 10 mg by mouth daily. 24 hours     meclizine (ANTIVERT) 25 MG tablet Take 1 tablet (25 mg total) by mouth 3 (three) times daily as needed for dizziness. 30 tablet 0   nicotine (NICOTROL) 10 MG inhaler Inhale 1 Cartridge (1 continuous puffing total) into the lungs as needed for smoking cessation. 42 each 1   pantoprazole (PROTONIX) 40 MG tablet Take 1 tablet (40 mg total) by mouth 2 (two) times daily. 180 tablet 3   SUMAtriptan (IMITREX) 100 MG tablet TAKE 1 TABLET BY MOUTH DAILY AS NEEDED. MAY REPEAT IN 2 HOURS IF HEADACHE PERSISTS OR RECURS. 10 tablet 5   acetaminophen (TYLENOL) 325 MG tablet Take 650 mg by mouth every 6 (six) hours as needed. (Patient not taking: Reported on 07/15/2022)     sucralfate (CARAFATE) 1 g tablet TAKE 1 TABLET BY MOUTH FOUR TIMES A DAY WITH MEALS AND AT BEDTIME (Patient not taking: Reported on 07/15/2022) 360 tablet 1   No  facility-administered medications prior to visit.    Allergies  Allergen Reactions   Chantix [Varenicline] Shortness Of Breath   Betadine [Povidone Iodine] Rash          Objective:    Physical Exam Vitals reviewed.  Constitutional:      General: He is awake. He is not in acute distress.    Appearance: Normal appearance. He is obese. He is ill-appearing. He is not toxic-appearing or diaphoretic.  HENT:     Right Ear: Hearing, ear canal and external ear normal. A middle ear effusion is present. Tympanic membrane is erythematous. Tympanic membrane is not bulging.     Left Ear: Tympanic membrane normal.     Nose: Nose normal.     Right Turbinates: Not enlarged or swollen.     Left Turbinates: Not enlarged or swollen.     Right Sinus: No maxillary sinus tenderness or frontal sinus tenderness.     Left Sinus: No maxillary sinus tenderness or frontal sinus tenderness.     Mouth/Throat:     Mouth: Mucous membranes are moist.     Pharynx: No pharyngeal swelling, oropharyngeal exudate or posterior oropharyngeal erythema.  Eyes:     Extraocular Movements: Extraocular movements intact.     Pupils: Pupils are equal, round, and reactive to light.  Cardiovascular:     Rate and Rhythm: Normal rate and regular rhythm.  Pulmonary:     Effort: Pulmonary effort is normal.     Breath sounds: Examination of the right-upper field reveals wheezing. Wheezing present. No decreased breath sounds or rhonchi.     Comments: Labored breathing  Slight expiratory wheeze right upper lobe  Neurological:     Mental Status: He is alert.       BP 138/72   Pulse (!) 101   Temp 99 F (37.2 C) (Oral)   Wt 202 lb 3.2 oz (91.7 kg)   SpO2 98%   BMI 29.86 kg/m  Wt Readings from Last 3 Encounters:  07/15/22 202 lb 3.2 oz (91.7 kg)  06/29/22 199 lb (90.3 kg)  04/01/22 180 lb (81.6 kg)     There are no preventive care reminders to display for this patient.  There are no preventive care reminders to  display for this patient.  Lab Results  Component Value Date   TSH 1.25 04/19/2020   Lab Results  Component Value Date   WBC 8.7  04/01/2022   HGB 16.6 04/01/2022   HCT 46.9 04/01/2022   MCV 84.7 04/01/2022   PLT 216 04/01/2022   Lab Results  Component Value Date   NA 138 04/01/2022   K 4.3 04/01/2022   CO2 22 04/01/2022   GLUCOSE 98 04/01/2022   BUN 12 04/01/2022   CREATININE 1.03 04/01/2022   BILITOT 0.9 04/01/2022   ALKPHOS 77 04/01/2022   AST 34 04/01/2022   ALT 43 04/01/2022   PROT 6.9 04/01/2022   ALBUMIN 4.5 04/01/2022   CALCIUM 9.6 04/01/2022   ANIONGAP 10 04/01/2022   EGFR 84 12/01/2021   GFR 90.76 10/09/2020   Lab Results  Component Value Date   CHOL 150 04/19/2020   Lab Results  Component Value Date   HDL 33.60 (L) 04/19/2020   Lab Results  Component Value Date   LDLCALC 81 04/19/2020   Lab Results  Component Value Date   TRIG 177.0 (H) 04/19/2020   Lab Results  Component Value Date   CHOLHDL 4 04/19/2020   No results found for: "HGBA1C"    Assessment & Plan:   Problem List Items Addressed This Visit       Nervous and Auditory   Otitis media    rx augmentin 875/125 mg po bid x 10 days Take antibiotic as prescribed. Increase oral fluids. Pt to f/u if sx worsen and or fail to improve in 2-3 days.       Relevant Medications   amoxicillin-clavulanate (AUGMENTIN) 875-125 MG tablet     Other   Shortness of breath - Primary    Stat chest x-ray today to rule out pneumonia pending results Rx prednisone 20 mg Advised if shortness of breath is to worsen please go to the emergency room or call 911      Relevant Medications   predniSONE (DELTASONE) 20 MG tablet   Other Relevant Orders   DG Chest 2 View    Meds ordered this encounter  Medications   amoxicillin-clavulanate (AUGMENTIN) 875-125 MG tablet    Sig: Take 1 tablet by mouth 2 (two) times daily.    Dispense:  20 tablet    Refill:  0    Order Specific Question:   Supervising  Provider    Answer:   BEDSOLE, AMY E [[id number]]   predniSONE (DELTASONE) 20 MG tablet    Sig: Take two tablets po qd for five days    Dispense:  10 tablet    Refill:  0    Order Specific Question:   Supervising Provider    Answer:   BEDSOLE, AMY E [[id number]]    Follow-up: No follow-ups on file.    Eugenia Pancoast, FNP

## 2022-11-17 ENCOUNTER — Ambulatory Visit (INDEPENDENT_AMBULATORY_CARE_PROVIDER_SITE_OTHER)
Admission: RE | Admit: 2022-11-17 | Discharge: 2022-11-17 | Disposition: A | Discharge status: Home / Self Care | Payer: BC Managed Care – PPO | Source: Ambulatory Visit | Attending: Nurse Practitioner | Admitting: Nurse Practitioner

## 2022-11-17 ENCOUNTER — Ambulatory Visit: Payer: BC Managed Care – PPO | Admitting: Nurse Practitioner

## 2022-11-17 ENCOUNTER — Encounter: Payer: Self-pay | Admitting: Nurse Practitioner

## 2022-11-17 VITALS — BP 112/78 | HR 83 | Ht 69.0 in | Wt 194.0 lb

## 2022-11-17 DIAGNOSIS — M25531 Pain in right wrist: Secondary | ICD-10-CM | POA: Insufficient documentation

## 2022-11-17 DIAGNOSIS — M779 Enthesopathy, unspecified: Secondary | ICD-10-CM | POA: Diagnosis not present

## 2022-11-17 MED ORDER — PREDNISONE 20 MG PO TABS: 40.0000 mg | ORAL_TABLET | Freq: Every day | ORAL | 0 refills | Status: DC

## 2022-11-17 MED ORDER — PREDNISONE 20 MG PO TABS: 40.0000 mg | ORAL_TABLET | Freq: Every day | ORAL | 0 refills | Status: AC

## 2022-11-17 MED ORDER — KETOROLAC TROMETHAMINE 60 MG/2ML IM SOLN
60.0000 mg | Freq: Once | INTRAMUSCULAR | Status: AC
  Administered 2022-11-17: 60 mg via INTRAMUSCULAR

## 2022-11-17 NOTE — Assessment & Plan Note (Signed)
Toradol 60 mg IM x 1 dose.  Prednisone 40 mg for 5 days.  Pending wrist x-ray follow-up with Dr. Frederico Hamman Copland Pertinent

## 2022-11-17 NOTE — Assessment & Plan Note (Signed)
Likely tendinitis with repetitive movement with his work.  Toradol 60 mg IM x 1 dose precautions reviewed with NSAID use.  Prednisone 40 mg for 5 days.  Follow-up if no improvement.  Did give him Dr. Frederico Hamman Copland information for sports medicine follow-up

## 2022-11-17 NOTE — Patient Instructions (Addendum)
Nice to see you today No ibuprofen while on the prednisone. You can take tylenol if you need it I will be in touch with the xray once I have the results   If you do not improve make an appointment with Dr. Frederico Hamman Copland

## 2022-11-17 NOTE — Progress Notes (Signed)
Acute Office Visit  Subjective:     Patient ID: Donald Merritt, male    DOB: Mar 07, 1986, 37 y.o.   MRN: [id number]  Chief Complaint  Patient presents with   Wrist Pain    Right, denies any recent injury, has metal plate in hand but doesn't remember which one, has not taken any OTC meds     Patient is in today for wrist pain with a history of IBS, GERD, Smoking. Has a metal plate in once hand but unsure of which   Right hand, wrist and arm pain that has been there for the past year. Then the last week it flared up on him. 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  States that it tingles and throbs. States that a sharp shooting pain that is intermittent . Worse with movement.  States no injury as of late. States that he may have injuried it as a kid  Has not tried any medications so far. States that he did wear a brace yesterday while at work but that made it worse Review of Systems  Musculoskeletal:  Positive for joint pain.  Neurological:  Positive for tingling. Negative for weakness.        Objective:    BP 112/78   Pulse 83   Ht '5\' 9"'$  (1.753 m)   Wt 194 lb (88 kg)   SpO2 98%   BMI 28.65 kg/m    Physical Exam Vitals and nursing note reviewed.  Constitutional:      Appearance: Normal appearance.  Cardiovascular:     Rate and Rhythm: Normal rate and regular rhythm.     Pulses:          Radial pulses are 2+ on the right side and 2+ on the left side.     Heart sounds: Normal heart sounds.  Pulmonary:     Effort: Pulmonary effort is normal.     Breath sounds: Normal breath sounds.  Musculoskeletal:        General: Tenderness present.     Right wrist: Tenderness present. No deformity or snuff box tenderness. Decreased range of motion. Normal pulse.     Left wrist: Normal pulse.       Arms:     Comments: Negative Finkelstein test Negative tinel test  Neurological:     Mental Status: He is alert.      Deep Tendon Reflexes:     Reflex Scores:      Bicep reflexes are 2+ on the right side and 2+ on the left side.    Comments: Bilateral upper extremity strength 5/5.     No results found for any visits on 11/17/22.      Assessment & Plan:   Problem List Items Addressed This Visit       Musculoskeletal and Integument   Tendonitis    Likely tendinitis with repetitive movement with his work.  Toradol 60 mg IM x 1 dose precautions reviewed with NSAID use.  Prednisone 40 mg for 5 days.  Follow-up if no improvement.  Did give him Dr. Frederico Hamman Copland information for sports medicine follow-up      Relevant Medications   predniSONE (DELTASONE) 20 MG tablet     Other   Right wrist pain - Primary    Toradol 60 mg IM x 1 dose.  Prednisone 40 mg for 5 days.  Pending wrist x-ray follow-up with Dr. Frederico Hamman Copland Pertinent  Relevant Medications   predniSONE (DELTASONE) 20 MG tablet   Other Relevant Orders   DG Wrist Complete Right    Meds ordered this encounter  Medications   ketorolac (TORADOL) injection 60 mg   DISCONTD: predniSONE (DELTASONE) 20 MG tablet    Sig: Take 2 tablets (40 mg total) by mouth daily with breakfast for 5 days.    Dispense:  10 tablet    Refill:  0    Order Specific Question:   Supervising Provider    Answer:   Glori Bickers, MARNE A [[id number]]   predniSONE (DELTASONE) 20 MG tablet    Sig: Take 2 tablets (40 mg total) by mouth daily with breakfast for 5 days.    Dispense:  10 tablet    Refill:  0    Order Specific Question:   Supervising Provider    Answer:   TOWER, MARNE A [[id number]]    Return if symptoms worsen or fail to improve.  Romilda Garret, NP

## 2023-01-26 ENCOUNTER — Other Ambulatory Visit: Payer: Self-pay

## 2023-01-26 ENCOUNTER — Emergency Department (HOSPITAL_COMMUNITY)
Admission: EM | Admit: 2023-01-26 | Discharge: 2023-01-26 | Disposition: A | Discharge status: Home / Self Care | Payer: BC Managed Care – PPO | Attending: Emergency Medicine | Admitting: Emergency Medicine

## 2023-01-26 ENCOUNTER — Encounter (HOSPITAL_COMMUNITY): Payer: Self-pay

## 2023-01-26 ENCOUNTER — Emergency Department (HOSPITAL_COMMUNITY): Payer: BC Managed Care – PPO

## 2023-01-26 DIAGNOSIS — R29818 Other symptoms and signs involving the nervous system: Secondary | ICD-10-CM | POA: Diagnosis not present

## 2023-01-26 DIAGNOSIS — H538 Other visual disturbances: Secondary | ICD-10-CM | POA: Insufficient documentation

## 2023-01-26 DIAGNOSIS — R519 Headache, unspecified: Secondary | ICD-10-CM | POA: Diagnosis not present

## 2023-01-26 DIAGNOSIS — R42 Dizziness and giddiness: Secondary | ICD-10-CM | POA: Insufficient documentation

## 2023-01-26 LAB — COMPREHENSIVE METABOLIC PANEL
ALT: 38 U/L (ref 0–44)
AST: 25 U/L (ref 15–41)
Albumin: 3.6 g/dL (ref 3.5–5.0)
Alkaline Phosphatase: 73 U/L (ref 38–126)
Anion gap: 9 (ref 5–15)
BUN: 6 mg/dL (ref 6–20)
CO2: 23 mmol/L (ref 22–32)
Calcium: 8.6 mg/dL — ABNORMAL LOW (ref 8.9–10.3)
Chloride: 107 mmol/L (ref 98–111)
Creatinine, Ser: 0.92 mg/dL (ref 0.61–1.24)
GFR, Estimated: >60 mL/min (ref >60)
Glucose, Bld: 91 mg/dL (ref 70–99)
Potassium: 4.2 mmol/L (ref 3.5–5.1)
Sodium: 139 mmol/L (ref 135–145)
Total Bilirubin: 0.8 mg/dL (ref 0.3–1.2)
Total Protein: 6 g/dL — ABNORMAL LOW (ref 6.5–8.1)

## 2023-01-26 LAB — CBC WITH DIFFERENTIAL/PLATELET
Abs Immature Granulocytes: 0.03 10*3/uL (ref 0.00–0.07)
Basophils Absolute: 0 10*3/uL (ref 0.0–0.1)
Basophils Relative: 0 %
Eosinophils Absolute: 0.2 10*3/uL (ref 0.0–0.5)
Eosinophils Relative: 2 %
HCT: 46.7 % (ref 39.0–52.0)
Hemoglobin: 16.2 g/dL (ref 13.0–17.0)
Immature Granulocytes: 0 %
Lymphocytes Relative: 20 %
Lymphs Abs: 1.9 10*3/uL (ref 0.7–4.0)
MCH: 29.6 pg (ref 26.0–34.0)
MCHC: 34.7 g/dL (ref 30.0–36.0)
MCV: 85.4 fL (ref 80.0–100.0)
Monocytes Absolute: 0.5 10*3/uL (ref 0.1–1.0)
Monocytes Relative: 5 %
Neutro Abs: 6.7 10*3/uL (ref 1.7–7.7)
Neutrophils Relative %: 73 %
Platelets: 248 10*3/uL (ref 150–400)
RBC: 5.47 MIL/uL (ref 4.22–5.81)
RDW: 12.3 % (ref 11.5–15.5)
WBC: 9.3 10*3/uL (ref 4.0–10.5)
nRBC: 0 % (ref 0.0–0.2)

## 2023-01-26 MED ORDER — MECLIZINE HCL 25 MG PO TABS
25.0000 mg | ORAL_TABLET | Freq: Three times a day (TID) | ORAL | 0 refills | Status: AC
Start: 2023-01-26 — End: 2023-02-02

## 2023-01-26 MED ORDER — LORAZEPAM 2 MG/ML IJ SOLN
0.5000 mg | Freq: Once | INTRAMUSCULAR | Status: AC
  Administered 2023-01-26: 0.5 mg via INTRAVENOUS
  Filled 2023-01-26: qty 1

## 2023-01-26 MED ORDER — MECLIZINE HCL 25 MG PO TABS
25.0000 mg | ORAL_TABLET | Freq: Once | ORAL | Status: AC
  Administered 2023-01-26: 25 mg via ORAL
  Filled 2023-01-26: qty 1

## 2023-01-26 MED ORDER — SODIUM CHLORIDE 0.9 % IV BOLUS
1000.0000 mL | Freq: Once | INTRAVENOUS | Status: AC
  Administered 2023-01-26: 1000 mL via INTRAVENOUS

## 2023-01-26 NOTE — ED Triage Notes (Addendum)
Pt arrived POV from home c/o dizziness that started yesterday. Pt states when he woke up and tried to get out of bed the entire room was spinning. Pt states it will ease up some but still really dizzy. Pt states he was fine when he went to bed Sunday but it started yesterday morning when he woke up.

## 2023-01-26 NOTE — Discharge Instructions (Signed)
As discussed, your evaluation today has been largely reassuring.  But, it is important that you monitor your condition carefully, and do not hesitate to return to the ED if you develop new, or concerning changes in your condition. ? ?Otherwise, please follow-up with your physician for appropriate ongoing care. ? ?

## 2023-01-26 NOTE — ED Provider Notes (Signed)
[facility] AT [facility] Provider Note   CSN: [id number] Arrival date & time: 01/26/23  1497     History  No chief complaint on file.   Donald Merritt is a 37 y.o. male.  HPI Patient presents with concern of dizziness and right eye blurry vision.  He is generally well, though he has a history of IBS.  Yesterday he awoke with dizziness, worse with ambulation and head motion.  No vomiting, no fall.  Today after awakening with similar symptoms he presents for evaluation.  He has some right eye blurry vision, though no vision loss.  No headache, fever. No recent medication change, diet change, activity change.    Home Medications Prior to Admission medications   Medication Sig Start Date End Date Taking? Authorizing Provider  fluticasone (FLONASE) 50 MCG/ACT nasal spray Place 1 spray into both nostrils daily. 11/27/17  Yes Caccavale, Sophia, PA-C  levocetirizine (XYZAL) 5 MG tablet Take 5 mg by mouth every evening.   Yes [provider]  meclizine (ANTIVERT) 25 MG tablet Take 1 tablet (25 mg total) by mouth 3 (three) times daily for 7 days. 01/26/23 02/02/23 Yes Gerhard Munch, MD  pantoprazole (PROTONIX) 40 MG tablet Take 1 tablet (40 mg total) by mouth 2 (two) times daily. 09/24/21  Yes Tower, Audrie Gallus, MD  gabapentin (NEURONTIN) 300 MG capsule TAKE 1 CAPSULE BY MOUTH TWICE A DAY Patient not taking: Reported on 01/26/2023 03/31/22   Tower, Audrie Gallus, MD  glycopyrrolate (ROBINUL) 2 MG tablet TAKE 1 TABLET (2 MG TOTAL) BY MOUTH 2 (TWO) TIMES DAILY. AS NEEDED FOR ABDOMINAL PAIN Patient not taking: Reported on 01/26/2023 06/17/22   Tower, Audrie Gallus, MD  meclizine (ANTIVERT) 25 MG tablet Take 1 tablet (25 mg total) by mouth 3 (three) times daily as needed for dizziness. Patient not taking: Reported on 01/26/2023 04/01/22   Henderly, Britni A, PA-C  nicotine (NICOTROL) 10 MG inhaler Inhale 1 Cartridge (1 continuous puffing total) into the lungs as needed for smoking  cessation. Patient not taking: Reported on 01/26/2023 09/24/21   Tower, Audrie Gallus, MD  SUMAtriptan (IMITREX) 100 MG tablet TAKE 1 TABLET BY MOUTH DAILY AS NEEDED. MAY REPEAT IN 2 HOURS IF HEADACHE PERSISTS OR RECURS. Patient not taking: Reported on 01/26/2023 05/04/22   Judy Pimple, MD      Allergies    Chantix [varenicline] and Betadine [povidone iodine]    Review of Systems   Review of Systems  All other systems reviewed and are negative.   Physical Exam Updated Vital Signs BP 118/84   Pulse 73   Temp 97.9 F (36.6 C) (Oral)   Resp 16   Ht 5\' 9"  (1.753 m)   Wt 81.6 kg   SpO2 98%   BMI 26.58 kg/m  Physical Exam Vitals and nursing note reviewed.  Constitutional:      General: He is not in acute distress.    Appearance: He is well-developed.  HENT:     Head: Normocephalic and atraumatic.  Eyes:     Conjunctiva/sclera: Conjunctivae normal.  Cardiovascular:     Rate and Rhythm: Normal rate and regular rhythm.     Pulses: Normal pulses.  Pulmonary:     Effort: Pulmonary effort is normal. No respiratory distress.     Breath sounds: No stridor.  Abdominal:     General: There is no distension.  Skin:    General: Skin is warm and dry.  Neurological:     Mental  Status: He is alert and oriented to person, place, and time.     Comments: Speech is clear face is symmetric cranial nerves unremarkable.  Patient does move his head in all dimensions, but notes that with quick motion he feels worsening dizziness.  Psychiatric:        Mood and Affect: Mood normal.        Behavior: Behavior normal.     ED Results / Procedures / Treatments   Labs (all labs ordered are listed, but only abnormal results are displayed) Labs Reviewed  COMPREHENSIVE METABOLIC PANEL - Abnormal; Notable for the following components:      Result Value   Calcium 8.6 (*)    Total Protein 6.0 (*)    All other components within normal limits  CBC WITH DIFFERENTIAL/PLATELET    EKG EKG  Interpretation  Date/Time:  Tuesday January 26 2023 09:07:38 EDT Ventricular Rate:  83 PR Interval:  140 QRS Duration: 105 QT Interval:  368 QTC Calculation: 433 R Axis:   68 Text Interpretation: Sinus rhythm Artifact Abnormal ECG Confirmed by Gerhard Munch [id number]) on 01/26/2023 9:37:36 AM  Radiology CT Head Wo Contrast  Result Date: 01/26/2023 CLINICAL DATA:  Headache, neuro deficit dizzy w new R eye vision change. EXAM: CT HEAD WITHOUT CONTRAST TECHNIQUE: Contiguous axial images were obtained from the base of the skull through the vertex without intravenous contrast. RADIATION DOSE REDUCTION: This exam was performed according to the departmental dose-optimization program which includes automated exposure control, adjustment of the mA and/or kV according to patient size and/or use of iterative reconstruction technique. COMPARISON:  CT head June 14, 23. FINDINGS: Brain: No evidence of acute infarction, hemorrhage, hydrocephalus, extra-axial collection or mass lesion/mass effect. Vascular: No hyperdense vessel. Skull: No acute fracture. Sinuses/Orbits: Clear sinuses.  No acute orbital findings. Other: No mastoid effusions. IMPRESSION: No evidence of acute intracranial abnormality. Electronically Signed   By: Feliberto Harts M.D.   On: 01/26/2023 09:15    Procedures Procedures    Medications Ordered in ED Medications  sodium chloride 0.9 % bolus 1,000 mL (1,000 mLs Intravenous New Bag/Given 01/26/23 0926)  LORazepam (ATIVAN) injection 0.5 mg (0.5 mg Intravenous Given 01/26/23 0919)  meclizine (ANTIVERT) tablet 25 mg (25 mg Oral Given 01/26/23 0092)    ED Course/ Medical Decision Making/ A&P                             Medical Decision Making Adult male with history of IBS otherwise well presents with sudden onset dizziness, blurry vision.  Patient is mildly hypertensive, otherwise hemodynamically unremarkable.  Absent other focal neurodeficits lower suspicion for CNS pathology, vascular  disruption, mass, though these are considerations. Patient had a head CT, labs, fluids, antidizziness meds. Cardiac 90 sinus normal Pulse ox 100% room air normal   Amount and/or Complexity of Data Reviewed External Data Reviewed: notes. Labs: ordered. Decision-making details documented in ED Course. Radiology: ordered and independent interpretation performed. Decision-making details documented in ED Course. ECG/medicine tests: ordered and independent interpretation performed. Decision-making details documented in ED Course.  Risk Prescription drug management.   2:09 PM Patient awake, alert, speaking clearly, no vomiting.  I have reviewed his labs, CT, no evidence for intracranial abnormality no evidence for electrolyte disturbance, little evidence for infection absent fever, hypotension.  Patient continues to note that with rapid head motion he has some dizziness.  Some suspicion for vertigo given his age, description of acute onset of symptoms,  positional exacerbations. Patient appropriate for discharge with outpatient follow-up with meclizine.        Final Clinical Impression(s) / ED Diagnoses Final diagnoses:  Dizziness    Rx / DC Orders ED Discharge Orders          Ordered    meclizine (ANTIVERT) 25 MG tablet  3 times daily        01/26/23 1409              Gerhard MunchLockwood, Dedee Liss, MD 01/26/23 1409

## 2023-01-29 ENCOUNTER — Telehealth: Payer: Self-pay

## 2023-01-29 NOTE — Transitions of Care (Post Inpatient/ED Visit) (Signed)
   01/29/2023  Name: Donald Merritt MRN: [id number] DOB: 04-04-1986  Today's TOC FU Call Status: Today's TOC FU Call Status:: Unsuccessul Call (1st Attempt) Unsuccessful Call (1st Attempt) Date: 01/29/23 [name] on EMMI-ED Discharge Alert Date & Reason: 01/28/23 "Scheduled follow-up appt? No")  Attempted to reach the patient regarding the most recent Inpatient/ED visit.  Follow Up Plan: Additional outreach attempts will be made to reach the patient to complete the Transitions of Care (Post Inpatient/ED visit) call.     Antionette Fairy, RN,BSN,CCM [facility] Care Management Care Management Community Coordinator Direct Phone: [phone] Toll Free: [phone] Fax: [phone]

## 2023-02-01 ENCOUNTER — Telehealth: Payer: Self-pay

## 2023-02-01 NOTE — Transitions of Care (Post Inpatient/ED Visit) (Signed)
   02/01/2023  Name: Donald Merritt MRN: [id number] DOB: 1985-12-23  Today's TOC FU Call Status: Today's TOC FU Call Status:: Unsuccessful Call (2nd Attempt) Unsuccessful Call (2nd Attempt) Date: 02/01/23 (Red on EMMI-ED Discharge Alert Date & Reason:01/28/23  "Scheduled follow-up appt? No")  Attempted to reach the patient regarding the most recent Inpatient/ED visit.  Follow Up Plan: Additional outreach attempts will be made to reach the patient to complete the Transitions of Care (Post Inpatient/ED visit) call.     Antionette Fairy, RN,BSN,CCM [facility] Care Management Care Management Community Coordinator Direct Phone: [phone] Toll Free: [phone] Fax: [phone]

## 2023-02-02 ENCOUNTER — Telehealth: Payer: Self-pay

## 2023-02-02 NOTE — Transitions of Care (Post Inpatient/ED Visit) (Signed)
   02/02/2023  Name: Donald Merritt MRN: [id number] DOB: Mar 08, 1986  Today's TOC FU Call Status: Today's TOC FU Call Status:: Unsuccessful Call (3rd Attempt) Unsuccessful Call (3rd Attempt) Date: 02/02/23 (Red on EMMI-ED Discharge Alert Date & Reason:01/28/23  "Scheduled follow-up appt? No")  Attempted to reach the patient regarding the most recent Inpatient/ED visit.  Follow Up Plan: No further outreach attempts will be made at this time. We have been unable to contact the patient.    Antionette Fairy, RN,BSN,CCM [facility] Care Management Care Management Community Coordinator Direct Phone: [phone] Toll Free: [phone] Fax: [phone]

## 2023-07-03 ENCOUNTER — Other Ambulatory Visit: Payer: Self-pay

## 2023-07-03 ENCOUNTER — Emergency Department (HOSPITAL_COMMUNITY)
Admission: EM | Admit: 2023-07-03 | Discharge: 2023-07-04 | Disposition: A | Discharge status: Home / Self Care | Payer: BC Managed Care – PPO | Attending: Emergency Medicine | Admitting: Emergency Medicine

## 2023-07-03 DIAGNOSIS — F17200 Nicotine dependence, unspecified, uncomplicated: Secondary | ICD-10-CM | POA: Diagnosis not present

## 2023-07-03 DIAGNOSIS — R109 Unspecified abdominal pain: Secondary | ICD-10-CM

## 2023-07-03 DIAGNOSIS — R1011 Right upper quadrant pain: Secondary | ICD-10-CM | POA: Diagnosis not present

## 2023-07-03 DIAGNOSIS — R1031 Right lower quadrant pain: Secondary | ICD-10-CM | POA: Insufficient documentation

## 2023-07-03 LAB — URINALYSIS, ROUTINE W REFLEX MICROSCOPIC
Bilirubin Urine: NEGATIVE
Glucose, UA: NEGATIVE mg/dL
Hgb urine dipstick: NEGATIVE
Ketones, ur: NEGATIVE mg/dL
Leukocytes,Ua: NEGATIVE
Nitrite: NEGATIVE
Protein, ur: NEGATIVE mg/dL
Specific Gravity, Urine: <1.005 — ABNORMAL LOW (ref 1.005–1.030)
pH: 6.5 (ref 5.0–8.0)

## 2023-07-03 LAB — COMPREHENSIVE METABOLIC PANEL
ALT: 63 U/L — ABNORMAL HIGH (ref 0–44)
AST: 32 U/L (ref 15–41)
Albumin: 4.1 g/dL (ref 3.5–5.0)
Alkaline Phosphatase: 79 U/L (ref 38–126)
Anion gap: 12 (ref 5–15)
BUN: 6 mg/dL (ref 6–20)
CO2: 23 mmol/L (ref 22–32)
Calcium: 9.1 mg/dL (ref 8.9–10.3)
Chloride: 105 mmol/L (ref 98–111)
Creatinine, Ser: 1.08 mg/dL (ref 0.61–1.24)
GFR, Estimated: >60 mL/min (ref >60)
Glucose, Bld: 99 mg/dL (ref 70–99)
Potassium: 3.6 mmol/L (ref 3.5–5.1)
Sodium: 140 mmol/L (ref 135–145)
Total Bilirubin: 0.5 mg/dL (ref 0.3–1.2)
Total Protein: 6.7 g/dL (ref 6.5–8.1)

## 2023-07-03 LAB — CBC
HCT: 47.6 % (ref 39.0–52.0)
Hemoglobin: 16.7 g/dL (ref 13.0–17.0)
MCH: 29.5 pg (ref 26.0–34.0)
MCHC: 35.1 g/dL (ref 30.0–36.0)
MCV: 84.1 fL (ref 80.0–100.0)
Platelets: 238 10*3/uL (ref 150–400)
RBC: 5.66 MIL/uL (ref 4.22–5.81)
RDW: 12 % (ref 11.5–15.5)
WBC: 10.5 10*3/uL (ref 4.0–10.5)
nRBC: 0 % (ref 0.0–0.2)

## 2023-07-03 LAB — LIPASE, BLOOD: Lipase: 25 U/L (ref 11–51)

## 2023-07-03 MED ORDER — HYOSCYAMINE SULFATE 0.125 MG SL SUBL
0.2500 mg | SUBLINGUAL_TABLET | Freq: Once | SUBLINGUAL | Status: AC
  Administered 2023-07-04: 0.25 mg via SUBLINGUAL
  Filled 2023-07-03: qty 2

## 2023-07-03 MED ORDER — LIDOCAINE VISCOUS HCL 2 % MT SOLN
15.0000 mL | Freq: Once | OROMUCOSAL | Status: AC
  Administered 2023-07-03: 15 mL via ORAL
  Filled 2023-07-03: qty 15

## 2023-07-03 MED ORDER — HYOSCYAMINE SULFATE 0.125 MG SL SUBL: 0.2500 mg | SUBLINGUAL_TABLET | Freq: Four times a day (QID) | SUBLINGUAL | 0 refills | Status: DC | PRN

## 2023-07-03 MED ORDER — KETOROLAC TROMETHAMINE 15 MG/ML IJ SOLN
15.0000 mg | Freq: Once | INTRAMUSCULAR | Status: AC
  Administered 2023-07-03: 15 mg via INTRAVENOUS
  Filled 2023-07-03: qty 1

## 2023-07-03 MED ORDER — ALUM & MAG HYDROXIDE-SIMETH 200-200-20 MG/5ML PO SUSP
30.0000 mL | Freq: Once | ORAL | Status: AC
  Administered 2023-07-03: 30 mL via ORAL
  Filled 2023-07-03: qty 30

## 2023-07-03 MED ORDER — SODIUM CHLORIDE 0.9 % IV BOLUS
1000.0000 mL | Freq: Once | INTRAVENOUS | Status: AC
  Administered 2023-07-03: 1000 mL via INTRAVENOUS

## 2023-07-03 MED ORDER — HYOSCYAMINE SULFATE 0.125 MG PO TABS
0.2500 mg | ORAL_TABLET | Freq: Once | ORAL | Status: DC
  Filled 2023-07-03: qty 2

## 2023-07-03 NOTE — ED Triage Notes (Signed)
Patient reports right abdominal pain radiating to lower back with nausea onset this week , no emesis or diarrhea , mild constipation .

## 2023-07-03 NOTE — ED Provider Notes (Signed)
[place] EMERGENCY DEPARTMENT AT [facility] Provider Note  CSN: [id number] Arrival date & time: 07/03/23 1843  Chief Complaint(s) Abdominal Pain  HPI Donald Merritt is a 37 y.o. male with a past medical history listed below and including renal stones and IBS who presents to the emergency department with 1 week of intermittent migratory but mostly right-sided abdominal pain with increased bloating/gassiness and mild constipation.  Patient endorsing cold sweats and clamminess when the pain hits and when he tried to have a bowel movement.  He endorses nausea during those times as well.  He denies any emesis.  No recent fevers or infections.  No coughing or congestion.  No urinary symptoms.  No bloody bowel movements.  He reports that this is similar to prior episodes in the past related to IBS flares.  The history is provided by the patient.    Past Medical History Past Medical History:  Diagnosis Date   Allergy    Depression    Environmental allergies    GERD (gastroesophageal reflux disease)    Other and unspecified noninfectious gastroenteritis and colitis(558.9)    Other malaise and fatigue    Panic attack    Renal stones    Tobacco use disorder    Unspecified sleep apnea    Patient Active Problem List   Diagnosis Date Noted   Tendonitis 11/17/2022   Right wrist pain 11/17/2022   Otitis media 07/15/2022   Shortness of breath 07/15/2022   Other headache syndrome 09/09/2021   Chronic headaches 04/17/2019   IBS (irritable bowel syndrome) 01/04/2017   Smoking 07/22/2015   Hypertriglyceridemia 03/13/2014   GERD (gastroesophageal reflux disease) 09/04/2011   History of kidney stones 03/12/2010   MICROSCOPIC HEMATURIA 03/12/2010   HYPERSOMNIA, PERSISTENT 03/15/2008   Allergic rhinitis 02/13/2008   DEPRESSION 04/27/2007   Home Medication(s) Prior to Admission medications   Medication Sig Start Date End Date Taking? Authorizing Provider  hyoscyamine (LEVSIN/SL)  0.125 MG SL tablet Place 2 tablets (0.25 mg total) under the tongue 4 (four) times daily as needed for up to 5 days. 07/03/23 07/08/23 Yes Juliyah Mergen, Amadeo Garnet, MD  fluticasone [facility]) 50 MCG/ACT nasal spray Place 1 spray into both nostrils daily. 11/27/17   Caccavale, Sophia, PA-C  gabapentin (NEURONTIN) 300 MG capsule TAKE 1 CAPSULE BY MOUTH TWICE A DAY Patient not taking: Reported on 01/26/2023 03/31/22   Tower, Audrie Gallus, MD  glycopyrrolate (ROBINUL) 2 MG tablet TAKE 1 TABLET (2 MG TOTAL) BY MOUTH 2 (TWO) TIMES DAILY. AS NEEDED FOR ABDOMINAL PAIN Patient not taking: Reported on 01/26/2023 06/17/22   Tower, Audrie Gallus, MD  levocetirizine (XYZAL) 5 MG tablet Take 5 mg by mouth every evening.    [provider]  meclizine (ANTIVERT) 25 MG tablet Take 1 tablet (25 mg total) by mouth 3 (three) times daily as needed for dizziness. Patient not taking: Reported on 01/26/2023 04/01/22   Henderly, Britni A, PA-C  nicotine (NICOTROL) 10 MG inhaler Inhale 1 Cartridge (1 continuous puffing total) into the lungs as needed for smoking cessation. Patient not taking: Reported on 01/26/2023 09/24/21   Tower, Audrie Gallus, MD  pantoprazole (PROTONIX) 40 MG tablet Take 1 tablet (40 mg total) by mouth 2 (two) times daily. 09/24/21   Tower, Audrie Gallus, MD  SUMAtriptan (IMITREX) 100 MG tablet TAKE 1 TABLET BY MOUTH DAILY AS NEEDED. MAY REPEAT IN 2 HOURS IF HEADACHE PERSISTS OR RECURS. Patient not taking: Reported on 01/26/2023 05/04/22   Judy Pimple, MD  Allergies Chantix [varenicline] and Betadine [povidone iodine]  Review of Systems Review of Systems As noted in HPI  Physical Exam Vital Signs  I have reviewed the triage vital signs BP (!) 141/94   Pulse 77   Temp 99 F (37.2 C) (Oral)   Resp 18   SpO2 99%   Physical Exam Vitals reviewed.  Constitutional:      General: He is not in acute  distress.    Appearance: He is well-developed. He is not diaphoretic.  HENT:     Head: Normocephalic and atraumatic.     Right Ear: External ear normal.     Left Ear: External ear normal.     Nose: Nose normal.     Mouth/Throat:     Mouth: Mucous membranes are moist.  Eyes:     General: No scleral icterus.    Conjunctiva/sclera: Conjunctivae normal.  Neck:     Trachea: Phonation normal.  Cardiovascular:     Rate and Rhythm: Normal rate and regular rhythm.  Pulmonary:     Effort: Pulmonary effort is normal. No respiratory distress.     Breath sounds: No stridor.  Abdominal:     General: There is no distension.     Tenderness: There is abdominal tenderness in the right upper quadrant and right lower quadrant. There is right CVA tenderness. Negative signs include Murphy's sign and McBurney's sign.    Musculoskeletal:        General: Normal range of motion.     Cervical back: Normal range of motion.  Neurological:     Mental Status: He is alert and oriented to person, place, and time.  Psychiatric:        Behavior: Behavior normal.     ED Results and Treatments Labs (all labs ordered are listed, but only abnormal results are displayed) Labs Reviewed  COMPREHENSIVE METABOLIC PANEL - Abnormal; Notable for the following components:      Result Value   ALT 63 (*)    All other components within normal limits  URINALYSIS, ROUTINE W REFLEX MICROSCOPIC - Abnormal; Notable for the following components:   Specific Gravity, Urine <1.005 (*)    All other components within normal limits  LIPASE, BLOOD  CBC                                                                                                                         EKG  EKG Interpretation Date/Time:    Ventricular Rate:    PR Interval:    QRS Duration:    QT Interval:    QTC Calculation:   R Axis:      Text Interpretation:         Radiology No results found.  Medications Ordered in ED Medications  ketorolac  (TORADOL) 15 MG/ML injection 15 mg (has no administration in time range)  sodium chloride 0.9 % bolus 1,000 mL (has no administration in time range)  hyoscyamine (LEVSIN SL) SL tablet 0.25 mg (has no  administration in time range)  alum & mag hydroxide-simeth (MAALOX/MYLANTA) 200-200-20 MG/5ML suspension 30 mL (30 mLs Oral Given 07/03/23 2338)    And  lidocaine (XYLOCAINE) 2 % viscous mouth solution 15 mL (15 mLs Oral Given 07/03/23 2338)   Procedures Procedures  (including critical care time) Medical Decision Making / ED Course   Medical Decision Making Amount and/or Complexity of Data Reviewed Labs: ordered. Decision-making details documented in ED Course.  Risk OTC drugs. Prescription drug management.    DDX below:  Abd pain Presentation favors IBS/functional  intestinal pain. Given location, will assess for evidence of bili obstruction though patient has had imaging in the past with no cholelithiasis noted.  Will also rule out pancreatitis.  Low suspicion for appendicitis but will reconsider if patient has leukocytosis.  Will also check urine to rule out UTI or hematuria that may point towards a renal stone. CBC without leukocytosis or anemia CMP without significant electrolyte derangements or renal sufficiency.  No evidence of bili obstruction or pancreatitis. UA without evidence of infection or hematuria. Patient provided with IV fluids, IV Toradol, oral GI cocktail and Levsin. On reassessment, patient pain improved. Tolerating PO    Final Clinical Impression(s) / ED Diagnoses Final diagnoses:  Abdominal cramping   The patient appears reasonably screened and/or stabilized for discharge and I doubt any other medical condition or other [facility] requiring further screening, evaluation, or treatment in the ED at this time. I have discussed the findings, Dx and Tx plan with the patient/family who expressed understanding and agree(s) with the plan. Discharge instructions discussed at  length. The patient/family was given strict return precautions who verbalized understanding of the instructions. No further questions at time of discharge.  Disposition: Discharge  Condition: Good  ED Discharge Orders          Ordered    hyoscyamine (LEVSIN/SL) 0.125 MG SL tablet  4 times daily PRN        07/03/23 2341             Follow Up: Tower, Audrie Gallus, MD [address] [place] [address] [phone]  Call  to schedule an appointment for close follow up    This chart was dictated using voice recognition software.  Despite best efforts to proofread,  errors can occur which can change the documentation meaning.    Nira Conn, MD 07/04/23 [phone]

## 2023-07-04 NOTE — ED Notes (Signed)
Patient verbalizes understanding of discharge instructions. Opportunity for questioning and answers were provided. Armband removed by staff, pt discharged from ED. Pt ambulatory to ED waiting room with steady gait.  

## 2023-07-16 ENCOUNTER — Encounter: Payer: Self-pay | Admitting: Family Medicine

## 2023-07-16 ENCOUNTER — Ambulatory Visit (INDEPENDENT_AMBULATORY_CARE_PROVIDER_SITE_OTHER): Payer: BC Managed Care – PPO | Admitting: Family Medicine

## 2023-07-16 VITALS — BP 138/88 | HR 85 | Temp 98.6°F | Ht 69.0 in | Wt 200.0 lb

## 2023-07-16 DIAGNOSIS — K219 Gastro-esophageal reflux disease without esophagitis: Secondary | ICD-10-CM

## 2023-07-16 DIAGNOSIS — G8929 Other chronic pain: Secondary | ICD-10-CM

## 2023-07-16 DIAGNOSIS — R519 Headache, unspecified: Secondary | ICD-10-CM | POA: Diagnosis not present

## 2023-07-16 DIAGNOSIS — Z23 Encounter for immunization: Secondary | ICD-10-CM | POA: Diagnosis not present

## 2023-07-16 DIAGNOSIS — K582 Mixed irritable bowel syndrome: Secondary | ICD-10-CM | POA: Diagnosis not present

## 2023-07-16 MED ORDER — HYOSCYAMINE SULFATE 0.125 MG SL SUBL: 0.2500 mg | SUBLINGUAL_TABLET | Freq: Four times a day (QID) | SUBLINGUAL | 1 refills | Status: AC | PRN

## 2023-07-16 MED ORDER — RIZATRIPTAN BENZOATE 10 MG PO TBDP: 10.0000 mg | ORAL_TABLET | ORAL | 3 refills | Status: DC | PRN

## 2023-07-16 MED ORDER — FAMOTIDINE 20 MG PO TABS: 20.0000 mg | ORAL_TABLET | Freq: Two times a day (BID) | ORAL | 1 refills | Status: DC

## 2023-07-16 NOTE — Progress Notes (Unsigned)
Subjective:    Patient ID: Donald Merritt, male    DOB: 04/22/1986, 37 y.o.   MRN: [id number]  HPI  Wt Readings from Last 3 Encounters:  07/16/23 200 lb (90.7 kg)  01/26/23 180 lb (81.6 kg)  11/17/22 194 lb (88 kg)   29.53 kg/m  Vitals:   07/16/23 1453  BP: 138/88  Pulse: 85  Temp: 98.6 F (37 C)  SpO2: 95%   Pt presents for follow up of ER visit for abd pain/ IBS symptoms and GERD  Seen on 9/14 Presented with 1 week of intermittent migratory abd pain and bloating/ gas With mild constipation  Some cold sweats/nausea with cramping and chills and nausea Did not vomit   Unsure what set it off  ? If something he ate the weekend prior   Was really bothering him on right side   More heatburn  More throat discomfort   Protonix 40 mg bid  First dose taken over 30 min before eating /drinking   Onions and garlic bother him Aware of fodmap diet   Lab Results  Component Value Date   NA 140 07/03/2023   K 3.6 07/03/2023   CO2 23 07/03/2023   GLUCOSE 99 07/03/2023   BUN 6 07/03/2023   CREATININE 1.08 07/03/2023   CALCIUM 9.1 07/03/2023   GFR 90.76 10/09/2020   EGFR 84 12/01/2021   GFRNONAA >60 07/03/2023   Lab Results  Component Value Date   ALT 63 (H) 07/03/2023   AST 32 07/03/2023   ALKPHOS 79 07/03/2023   BILITOT 0.5 07/03/2023   Lab Results  Component Value Date   WBC 10.5 07/03/2023   HGB 16.7 07/03/2023   HCT 47.6 07/03/2023   MCV 84.1 07/03/2023   PLT 238 07/03/2023   No etoh (rarely)  Takes tylenol for headaches- moreso lately     Urinalysis clear  Lab Results  Component Value Date   LIPASE 25 07/03/2023    Given mylanta and visc lidocaine Ketorolac Fluids  Hyoscamine   A/p DDX below:   Abd pain Presentation favors IBS/functional  intestinal pain. Given location, will assess for evidence of bili obstruction though patient has had imaging in the past with no cholelithiasis noted.  Will also rule out pancreatitis.  Low suspicion  for appendicitis but will reconsider if patient has leukocytosis.  Will also check urine to rule out UTI or hematuria that may point towards a renal stone. CBC without leukocytosis or anemia CMP without significant electrolyte derangements or renal sufficiency.  No evidence of bili obstruction or pancreatitis. UA without evidence of infection or hematuria. Patient provided with IV fluids, IV Toradol, oral GI cocktail and Levsin. On reassessment, patient pain improved. Tolerating PO   Used hyoscamine SL and that helped    Today  A little crampy but not like it was (close to baseline)    Patient Active Problem List   Diagnosis Date Noted   Tendonitis 11/17/2022   Right wrist pain 11/17/2022   Shortness of breath 07/15/2022   Other headache syndrome 09/09/2021   Chronic headaches 04/17/2019   IBS (irritable bowel syndrome) 01/04/2017   Smoking 07/22/2015   Hypertriglyceridemia 03/13/2014   GERD (gastroesophageal reflux disease) 09/04/2011   History of kidney stones 03/12/2010   MICROSCOPIC HEMATURIA 03/12/2010   HYPERSOMNIA, PERSISTENT 03/15/2008   Allergic rhinitis 02/13/2008   DEPRESSION 04/27/2007   Past Medical History:  Diagnosis Date   Allergy    Depression    Environmental allergies    GERD (  gastroesophageal reflux disease)    Other and unspecified noninfectious gastroenteritis and colitis(558.9)    Other malaise and fatigue    Panic attack    Renal stones    Tobacco use disorder    Unspecified sleep apnea    Past Surgical History:  Procedure Laterality Date   neg hx     Social History   Tobacco Use   Smoking status: Former    Current packs/day: 0.00    Average packs/day: 1 pack/day for 15.0 years (15.0 ttl pk-yrs)    Types: Cigarettes    Start date: 09/04/2005    Quit date: 09/04/2020    Years since quitting: 2.8   Smokeless tobacco: Former    Types: Snuff    Quit date: 10/20/2011  Vaping Use   Vaping status: Former  Substance Use Topics   Alcohol  use: Not Currently    Alcohol/week: 0.0 standard drinks of alcohol   Drug use: No   Family History  Problem Relation Age of Onset   Asthma Mother    Hypertension Father    Heart disease Paternal Grandmother    Diabetes Paternal Grandmother    Lactose intolerance Daughter    Bone cancer Paternal Grandfather    Colon cancer Neg Hx    Esophageal cancer Neg Hx    Pancreatic cancer Neg Hx    Stomach cancer Neg Hx    Kidney disease Neg Hx    Liver disease Neg Hx    Allergies  Allergen Reactions   Chantix [Varenicline] Shortness Of Breath   Betadine [Povidone Iodine] Rash   Current Outpatient Medications on File Prior to Visit  Medication Sig Dispense Refill   fluticasone (FLONASE) 50 MCG/ACT nasal spray Place 1 spray into both nostrils daily. 16 g 0   gabapentin (NEURONTIN) 300 MG capsule TAKE 1 CAPSULE BY MOUTH TWICE A DAY 60 capsule 3   glycopyrrolate (ROBINUL) 2 MG tablet TAKE 1 TABLET (2 MG TOTAL) BY MOUTH 2 (TWO) TIMES DAILY. AS NEEDED FOR ABDOMINAL PAIN 180 tablet 1   levocetirizine (XYZAL) 5 MG tablet Take 5 mg by mouth every evening.     meclizine (ANTIVERT) 25 MG tablet Take 1 tablet (25 mg total) by mouth 3 (three) times daily as needed for dizziness. 30 tablet 0   nicotine (NICOTROL) 10 MG inhaler Inhale 1 Cartridge (1 continuous puffing total) into the lungs as needed for smoking cessation. 42 each 1   pantoprazole (PROTONIX) 40 MG tablet Take 1 tablet (40 mg total) by mouth 2 (two) times daily. 180 tablet 3   No current facility-administered medications on file prior to visit.    Review of Systems  Constitutional:  Negative for activity change, appetite change, fatigue, fever and unexpected weight change.  HENT:  Negative for congestion, rhinorrhea, sore throat and trouble swallowing.   Eyes:  Negative for pain, redness, itching and visual disturbance.  Respiratory:  Negative for cough, chest tightness, shortness of breath and wheezing.   Cardiovascular:  Negative for  chest pain and palpitations.  Gastrointestinal:  Positive for abdominal distention and abdominal pain. Negative for anal bleeding, blood in stool, constipation, diarrhea, nausea and vomiting.       Heartburn  Intermittent abd cramping- is improved   Endocrine: Negative for cold intolerance, heat intolerance, polydipsia and polyuria.  Genitourinary:  Negative for difficulty urinating, dysuria, frequency and urgency.  Musculoskeletal:  Negative for arthralgias, joint swelling and myalgias.  Skin:  Negative for pallor and rash.  Neurological:  Negative for dizziness, tremors,  weakness, numbness and headaches.  Hematological:  Negative for adenopathy. Does not bruise/bleed easily.  Psychiatric/Behavioral:  Negative for decreased concentration and dysphoric mood. The patient is not nervous/anxious.        Objective:   Physical Exam Constitutional:      General: He is not in acute distress.    Appearance: Normal appearance. He is well-developed. He is obese. He is not ill-appearing or diaphoretic.  HENT:     Head: Normocephalic and atraumatic.     Mouth/Throat:     Mouth: Mucous membranes are moist.  Eyes:     General: No scleral icterus.    Conjunctiva/sclera: Conjunctivae normal.     Pupils: Pupils are equal, round, and reactive to light.  Neck:     Thyroid: No thyromegaly.     Vascular: No carotid bruit or JVD.  Cardiovascular:     Rate and Rhythm: Normal rate and regular rhythm.     Heart sounds: Normal heart sounds.     No gallop.  Pulmonary:     Effort: Pulmonary effort is normal. No respiratory distress.     Breath sounds: Normal breath sounds. No wheezing or rales.  Abdominal:     General: Abdomen is protuberant. Bowel sounds are normal. There is no distension or abdominal bruit.     Palpations: Abdomen is soft. There is no shifting dullness, fluid wave, hepatomegaly, splenomegaly, mass or pulsatile mass.     Tenderness: There is abdominal tenderness in the right lower  quadrant and left lower quadrant. There is no guarding or rebound. Negative signs include Murphy's sign and McBurney's sign.     Hernia: No hernia is present.     Comments: Mild bliat lower abd tenderness  Musculoskeletal:     Cervical back: Normal range of motion and neck supple.     Right lower leg: No edema.     Left lower leg: No edema.  Lymphadenopathy:     Cervical: No cervical adenopathy.  Skin:    General: Skin is warm and dry.     Coloration: Skin is not pale.     Findings: No rash.  Neurological:     Mental Status: He is alert.     Cranial Nerves: No cranial nerve deficit.     Sensory: No sensory deficit.     Motor: No weakness.     Coordination: Coordination normal.     Gait: Gait normal.     Deep Tendon Reflexes: Reflexes are normal and symmetric. Reflexes normal.  Psychiatric:        Mood and Affect: Mood normal.           Assessment & Plan:   Problem List Items Addressed This Visit       Digestive   GERD (gastroesophageal reflux disease)    More heartburn lately / taking protonix 40 mg bid appropriately  Watching diet/discussed triggers (onions and garlic)  Will add pepcid 20 mg bid   Follow up in 1 month  Consider GI ref if worse or not improving      Relevant Medications   hyoscyamine (LEVSIN/SL) 0.125 MG SL tablet   famotidine (PEPCID) 20 MG tablet   IBS (irritable bowel syndrome) - Primary    Seen in ER on 9/14  Reviewed hospital records, lab results and studies in detail  Having moderate abd cramping with some vagal symptoms   Improved with mylanta, ketorolac and hyoscamine Is doing better overall  Refilled the hyoscamine (SL) which was helpful  Avoid triggers (fodmap  diet given) Instructed to update if not improving more  Call back and Er precautions noted in detail today   Low threshold to return to GI      Relevant Medications   hyoscyamine (LEVSIN/SL) 0.125 MG SL tablet   famotidine (PEPCID) 20 MG tablet     Other   Chronic  headaches    Migraine type  More lately and has missed some work  Imitrex not working as well , and may be getting rebound from tylenosl use He tried [employer] and it worked   Prescription for [employer] 10 today to see how it works Continues gabapentin  Can continue increase in this later if needed  Follow up planned 1 mo  Follow up also planned to work on fmla if applicable      Relevant Medications   rizatriptan (MAXALT-MLT) 10 MG disintegrating tablet   Other Visit Diagnoses     Encounter for immunization       Relevant Orders   Flu vaccine trivalent PF, 6mos and older(Flulaval,Afluria,Fluarix,Fluzone) (Completed)

## 2023-07-16 NOTE — Patient Instructions (Addendum)
Add pepcid 20 mg twice daily for acid reflex Continue the protonix   I refilled the hyoscamine (under tongue) for acute abdominal cramps   Keep watching diet for things that trigger you     Slow down on the tylenol - it may be giving you rebound headaches   Replace the generic imitrex with generic maxalt (melting pill)  For headache as needed    Follow up in a month   Get some FMLA (family medical leave act)  paperwork from your job and bring to a separate visit and will work on it

## 2023-07-18 NOTE — Assessment & Plan Note (Signed)
Seen in ER on 9/14  Reviewed hospital records, lab results and studies in detail  Having moderate abd cramping with some vagal symptoms   Improved with mylanta, ketorolac and hyoscamine Is doing better overall  Refilled the hyoscamine (SL) which was helpful  Avoid triggers (fodmap diet given) Instructed to update if not improving more  Call back and Er precautions noted in detail today   Low threshold to return to GI

## 2023-07-18 NOTE — Assessment & Plan Note (Signed)
Migraine type  More lately and has missed some work  Imitrex not working as well , and may be getting rebound from tylenosl use He tried [employer] and it worked   Prescription for [employer] 10 today to see how it works Continues gabapentin  Can continue increase in this later if needed  Follow up planned 1 mo  Follow up also planned to work on fmla if applicable

## 2023-07-18 NOTE — Assessment & Plan Note (Signed)
More heartburn lately / taking protonix 40 mg bid appropriately  Watching diet/discussed triggers (onions and garlic)  Will add pepcid 20 mg bid   Follow up in 1 month  Consider GI ref if worse or not improving

## 2023-07-28 ENCOUNTER — Encounter: Payer: Self-pay | Admitting: Gastroenterology

## 2023-08-06 ENCOUNTER — Telehealth: Payer: Self-pay

## 2023-08-06 ENCOUNTER — Encounter: Payer: Self-pay | Admitting: Family Medicine

## 2023-08-06 ENCOUNTER — Ambulatory Visit: Payer: BC Managed Care – PPO | Admitting: Family Medicine

## 2023-08-06 VITALS — BP 142/88 | HR 79 | Temp 98.3°F | Ht 69.0 in | Wt 205.0 lb

## 2023-08-06 DIAGNOSIS — R030 Elevated blood-pressure reading, without diagnosis of hypertension: Secondary | ICD-10-CM | POA: Diagnosis not present

## 2023-08-06 DIAGNOSIS — R519 Headache, unspecified: Secondary | ICD-10-CM

## 2023-08-06 DIAGNOSIS — G8929 Other chronic pain: Secondary | ICD-10-CM | POA: Diagnosis not present

## 2023-08-06 DIAGNOSIS — K582 Mixed irritable bowel syndrome: Secondary | ICD-10-CM | POA: Diagnosis not present

## 2023-08-06 DIAGNOSIS — R7401 Elevation of levels of liver transaminase levels: Secondary | ICD-10-CM | POA: Diagnosis not present

## 2023-08-06 MED ORDER — METOPROLOL SUCCINATE ER 25 MG PO TB24: 25.0000 mg | ORAL_TABLET | Freq: Every day | ORAL | 3 refills | Status: DC

## 2023-08-06 NOTE — Telephone Encounter (Signed)
Transition Care Management Unsuccessful Follow-up Telephone Call  Date of discharge and from where:  07/04/2023 [facility]  Attempts:  2nd Attempt  Reason for unsuccessful TCM follow-up call:  Left voice message  Soumya Colson [facility]  [facility] Resource Care Guide Direct Dial: [phone]  Website: [email or web address]

## 2023-08-06 NOTE — Patient Instructions (Addendum)
If you get constipation - try miralax over the counter  Takes a few days to works  Start the metoprolol xl for blood pressure and headaches If any side effects stop it and let us know   Aim for 64 oz fluid per day - mostly water   I put the referral in  neurology for headaches  Please let us know if you don't hear in 1-2 weeks   Call this GI number to get a follow up appointment regarding IBS and colonoscopy  [facility]  [phone]   Follow up as planned

## 2023-08-06 NOTE — Progress Notes (Unsigned)
Subjective:    Patient ID: Donald Merritt, male    DOB: 21-Jun-1986, 37 y.o.   MRN: [id number]  HPI  Wt Readings from Last 3 Encounters:  08/06/23 205 lb (93 kg)  07/16/23 200 lb (90.7 kg)  01/26/23 180 lb (81.6 kg)   30.27 kg/m  Vitals:   08/06/23 1355 08/06/23 1430  BP: (!) 144/88 (!) 142/88  Pulse: 79   Temp: 98.3 F (36.8 C)   SpO2: 99%     Pt presents to follow up for migraine headaches IBS  Also fmla    Migraine headaches  Last visit discussed analgesic overuse possible from tylenol Prescribed maxalt to try instead of imitrex - it does work better  Continues gabapentin 600 mg bid  7 headaches since last visit  Takes about an hour to work   Missed 3 days of work since last visit   Watching his diet- eating more regularly and smaller portions Fluids  - has worked on fluids (did get dehydrated twice and was aware and makes a big difference)  Caffeine - watching this  Reduced his coffee in am to 12 oz    Pinto beans / brussel sprouts - make him more gassy  Avoiding those   Has softer stools now /after a period of constipation  Improved now  Less gas now  with better diet   On average gets 1-3 headaches per week  Not daily  Usually has to leave work if he gets one [employer] wakes up with one and cannot go to work   For medical reasons    Smoking status: non smoker    No longer taking tylenol  Lab Results  Component Value Date   ALT 57 (H) 08/06/2023   AST 31 08/06/2023   ALKPHOS 79 07/03/2023   BILITOT 0.6 08/06/2023       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    Patient Active Problem List   Diagnosis Date Noted   Elevated transaminase level 08/06/2023   Elevated blood pressure reading 08/06/2023   Other headache syndrome 09/09/2021   Chronic headaches 04/17/2019   IBS (irritable bowel syndrome) 01/04/2017   Former smoker 07/22/2015   Hypertriglyceridemia 03/13/2014   GERD  (gastroesophageal reflux disease) 09/04/2011   History of kidney stones 03/12/2010   MICROSCOPIC HEMATURIA 03/12/2010   HYPERSOMNIA, PERSISTENT 03/15/2008   Allergic rhinitis 02/13/2008   DEPRESSION 04/27/2007   Past Medical History:  Diagnosis Date   Allergy    Depression    Environmental allergies    GERD (gastroesophageal reflux disease)    Other and unspecified noninfectious gastroenteritis and colitis(558.9)    Other malaise and fatigue    Panic attack    Renal stones    Tobacco use disorder    Unspecified sleep apnea    Past Surgical History:  Procedure Laterality Date   neg hx     Social History   Tobacco Use   Smoking status: Former    Current packs/day: 0.00    Average packs/day: 1 pack/day for 15.0 years (15.0 ttl pk-yrs)    Types: Cigarettes    Start date: 09/04/2005    Quit date: 09/04/2020    Years since quitting: 2.9   Smokeless tobacco: Former    Types: Snuff    Quit date: 10/20/2011  Vaping Use   Vaping status: Former  Substance Use Topics   Alcohol use: Not Currently    Alcohol/week: 0.0 standard drinks of alcohol  Drug use: No   Family History  Problem Relation Age of Onset   Asthma Mother    Hypertension Father    Heart disease Paternal Grandmother    Diabetes Paternal Grandmother    Lactose intolerance Daughter    Bone cancer Paternal Grandfather    Colon cancer Neg Hx    Esophageal cancer Neg Hx    Pancreatic cancer Neg Hx    Stomach cancer Neg Hx    Kidney disease Neg Hx    Liver disease Neg Hx    Allergies  Allergen Reactions   Chantix [Varenicline] Shortness Of Breath   Betadine [Povidone Iodine] Rash   Current Outpatient Medications on File Prior to Visit  Medication Sig Dispense Refill   famotidine (PEPCID) 20 MG tablet Take 1 tablet (20 mg total) by mouth 2 (two) times daily. 180 tablet 1   fluticasone (FLONASE) 50 MCG/ACT nasal spray Place 1 spray into both nostrils daily. 16 g 0   gabapentin (NEURONTIN) 300 MG capsule  TAKE 1 CAPSULE BY MOUTH TWICE A DAY 60 capsule 3   glycopyrrolate (ROBINUL) 2 MG tablet TAKE 1 TABLET (2 MG TOTAL) BY MOUTH 2 (TWO) TIMES DAILY. AS NEEDED FOR ABDOMINAL PAIN 180 tablet 1   hyoscyamine (LEVSIN/SL) 0.125 MG SL tablet Place 2 tablets (0.25 mg total) under the tongue 4 (four) times daily as needed for cramping. 30 tablet 1   levocetirizine (XYZAL) 5 MG tablet Take 5 mg by mouth every evening.     meclizine (ANTIVERT) 25 MG tablet Take 1 tablet (25 mg total) by mouth 3 (three) times daily as needed for dizziness. 30 tablet 0   pantoprazole (PROTONIX) 40 MG tablet Take 1 tablet (40 mg total) by mouth 2 (two) times daily. 180 tablet 3   rizatriptan (MAXALT-MLT) 10 MG disintegrating tablet Take 1 tablet (10 mg total) by mouth as needed for migraine. May repeat in 2 hours if needed 10 tablet 3   No current facility-administered medications on file prior to visit.    Review of Systems  Constitutional:  Negative for activity change, appetite change, fatigue, fever and unexpected weight change.  HENT:  Negative for congestion, rhinorrhea, sore throat and trouble swallowing.   Eyes:  Negative for pain, redness, itching and visual disturbance.  Respiratory:  Negative for cough, chest tightness, shortness of breath and wheezing.   Cardiovascular:  Negative for chest pain and palpitations.  Gastrointestinal:  Positive for constipation and diarrhea. Negative for abdominal pain, blood in stool and nausea.       Intermittent abd cramping  Endocrine: Negative for cold intolerance, heat intolerance, polydipsia and polyuria.  Genitourinary:  Negative for difficulty urinating, dysuria, frequency and urgency.  Musculoskeletal:  Negative for arthralgias, joint swelling and myalgias.  Skin:  Negative for pallor and rash.  Neurological:  Positive for headaches. Negative for dizziness, tremors, weakness and numbness.  Hematological:  Negative for adenopathy. Does not bruise/bleed easily.   Psychiatric/Behavioral:  Negative for decreased concentration and dysphoric mood. The patient is not nervous/anxious.        Objective:   Physical Exam Constitutional:      General: He is not in acute distress.    Appearance: Normal appearance. He is well-developed. He is obese. He is not ill-appearing or diaphoretic.  HENT:     Head: Normocephalic and atraumatic.  Eyes:     Conjunctiva/sclera: Conjunctivae normal.     Pupils: Pupils are equal, round, and reactive to light.  Neck:     Thyroid: No  thyromegaly.     Vascular: No carotid bruit or JVD.  Cardiovascular:     Rate and Rhythm: Normal rate and regular rhythm.     Heart sounds: Normal heart sounds.     No gallop.  Pulmonary:     Effort: Pulmonary effort is normal. No respiratory distress.     Breath sounds: Normal breath sounds. No wheezing or rales.  Abdominal:     General: There is no distension or abdominal bruit.     Palpations: Abdomen is soft.  Musculoskeletal:     Cervical back: Normal range of motion and neck supple.     Right lower leg: No edema.     Left lower leg: No edema.  Lymphadenopathy:     Cervical: No cervical adenopathy.  Skin:    General: Skin is warm and dry.     Coloration: Skin is not pale.     Findings: No rash.  Neurological:     Mental Status: He is alert.     Cranial Nerves: No cranial nerve deficit.     Motor: No weakness.     Coordination: Coordination normal.     Gait: Gait normal.     Deep Tendon Reflexes: Reflexes are normal and symmetric. Reflexes normal.  Psychiatric:        Mood and Affect: Mood normal.           Assessment & Plan:   Problem List Items Addressed This Visit       Digestive   IBS (irritable bowel syndrome)    Pt has tried both hyoscamyine and glycopyrrolate  Avoids some foods and eating smaller portions now  At times abd cramping keep him from going to work  Is due for GI follow up and recall colonoscopy for polyp      Relevant Orders    Ambulatory referral to Gastroenterology     Other   Chronic headaches - Primary    Ongoing 2-3 headaches per week often causing work absence  Has had sleep study in past  FMLA paperwork done today in office   Continues gabapentin  Will add beta blocker (metoprolol xl) for further help with headache (and blood pressure)  Maxalt for rescue Ref to neurology - may benefit from other proph treatment or trigger point injections /botox       Relevant Medications   metoprolol succinate (TOPROL-XL) 25 MG 24 hr tablet   Other Relevant Orders   Ambulatory referral to Neurology   Elevated blood pressure reading    BP: (!) 142/88   For this and chronic headaches  Will try metoprolol xl 25 mg daily  Instructed to call if any side effects  Follow up planned       Elevated transaminase level    Was taking more tylenol at time of last labs   Hast stopped that  No alcohol   Lab today      Relevant Orders   Hepatic function panel (Completed)

## 2023-08-06 NOTE — Telephone Encounter (Signed)
Transition Care Management Unsuccessful Follow-up Telephone Call  Date of discharge and from where:  07/04/2023 [facility]  Attempts:  1st Attempt  Reason for unsuccessful TCM follow-up call:  Left voice message  Donald Merritt [facility]  [facility], [facility] Resource Care Guide Direct Dial: [phone]  Website: [email or web address]

## 2023-08-07 LAB — HEPATIC FUNCTION PANEL
AG Ratio: 2 (calc) (ref 1.0–2.5)
ALT: 57 U/L — ABNORMAL HIGH (ref 9–46)
AST: 31 U/L (ref 10–40)
Albumin: 4.8 g/dL (ref 3.6–5.1)
Alkaline phosphatase (APISO): 99 U/L (ref 36–130)
Bilirubin, Direct: 0.1 mg/dL (ref 0.0–0.2)
Globulin: 2.4 g/dL (ref 1.9–3.7)
Indirect Bilirubin: 0.5 mg/dL (ref 0.2–1.2)
Total Bilirubin: 0.6 mg/dL (ref 0.2–1.2)
Total Protein: 7.2 g/dL (ref 6.1–8.1)

## 2023-08-08 ENCOUNTER — Encounter: Payer: Self-pay | Admitting: Family Medicine

## 2023-08-08 DIAGNOSIS — K760 Fatty (change of) liver, not elsewhere classified: Secondary | ICD-10-CM | POA: Insufficient documentation

## 2023-08-08 NOTE — Assessment & Plan Note (Signed)
BP: (!) 142/88   For this and chronic headaches  Will try metoprolol xl 25 mg daily  Instructed to call if any side effects  Follow up planned

## 2023-08-08 NOTE — Assessment & Plan Note (Signed)
Pt has tried both hyoscamyine and glycopyrrolate  Avoids some foods and eating smaller portions now  At times abd cramping keep him from going to work  Is due for GI follow up and recall colonoscopy for polyp

## 2023-08-08 NOTE — Assessment & Plan Note (Signed)
Ongoing 2-3 headaches per week often causing work absence  Has had sleep study in past  FMLA paperwork done today in office   Continues gabapentin  Will add beta blocker (metoprolol xl) for further help with headache (and blood pressure)  Maxalt for rescue Ref to neurology - may benefit from other proph treatment or trigger point injections /botox

## 2023-08-08 NOTE — Assessment & Plan Note (Signed)
Was taking more tylenol at time of last labs   Hast stopped that  No alcohol   Lab today

## 2023-08-20 ENCOUNTER — Ambulatory Visit: Payer: BC Managed Care – PPO | Admitting: Family Medicine

## 2023-08-27 ENCOUNTER — Ambulatory Visit: Payer: BC Managed Care – PPO | Admitting: Family Medicine

## 2023-08-27 ENCOUNTER — Encounter: Payer: Self-pay | Admitting: Family Medicine

## 2023-08-27 ENCOUNTER — Encounter: Payer: Self-pay | Admitting: Gastroenterology

## 2023-08-27 VITALS — BP 122/80 | HR 81 | Temp 97.9°F | Ht 69.0 in | Wt 208.4 lb

## 2023-08-27 DIAGNOSIS — E6609 Other obesity due to excess calories: Secondary | ICD-10-CM

## 2023-08-27 DIAGNOSIS — G4489 Other headache syndrome: Secondary | ICD-10-CM

## 2023-08-27 DIAGNOSIS — R030 Elevated blood-pressure reading, without diagnosis of hypertension: Secondary | ICD-10-CM

## 2023-08-27 DIAGNOSIS — K760 Fatty (change of) liver, not elsewhere classified: Secondary | ICD-10-CM

## 2023-08-27 DIAGNOSIS — E66811 Obesity, class 1: Secondary | ICD-10-CM

## 2023-08-27 DIAGNOSIS — K582 Mixed irritable bowel syndrome: Secondary | ICD-10-CM

## 2023-08-27 DIAGNOSIS — Z6830 Body mass index (BMI) 30.0-30.9, adult: Secondary | ICD-10-CM

## 2023-08-27 MED ORDER — GABAPENTIN 300 MG PO CAPS: 300.0000 mg | ORAL_CAPSULE | Freq: Two times a day (BID) | ORAL | 2 refills | Status: DC

## 2023-08-27 NOTE — Patient Instructions (Addendum)
If you avoid processed foods this will limit intake of sodium   Try to get most of your carbohydrates from produce (with the exception of white potatoes) and whole grains Eat less bread/pasta/rice/snack foods/cereals/sweets and other items from the middle of the grocery store (processed carbs)  See GI as planned  Call the neurology office to set up a neuro appointment   Continue current medicine   Take care of yourself  Stay hydrated  Avoid carbonated things   Limit caffeine if you can  Use caffeine if needed for a headache   Schedule annual exam in 3 months

## 2023-08-27 NOTE — Progress Notes (Unsigned)
Subjective:    Patient ID: Donald Merritt, male    DOB: 1986-08-24, 37 y.o.   MRN: [id number]  HPI  Wt Readings from Last 3 Encounters:  08/27/23 208 lb 6 oz (94.5 kg)  08/06/23 205 lb (93 kg)  07/16/23 200 lb (90.7 kg)   30.77 kg/m  Vitals:   08/27/23 1354 08/27/23 1413  BP: (!) 138/94 122/80  Pulse: 81   Temp: 97.9 F (36.6 C)   SpO2: 98%     Pt presents for follow up of GI issues and migraines and blood pressure   Last visit we referred to GI for onging IBS symptoms of symptoms and also recall colonoscopy  He has appointment in jan   For headaches Has gabapentin for prevention Maxalt for rescue  Last visit added metoprolol xl  25 mg daily for prevention and also blood pressure   BP Readings from Last 3 Encounters:  08/27/23 122/80  08/06/23 (!) 142/88  07/16/23 138/88   Pulse Readings from Last 3 Encounters:  08/27/23 81  08/06/23 79  07/16/23 85   Headaches are improving   Wednesday -had a headache (no caffiene )  Usually has just a little caffeine  That was the only headache since last visit    Stress Wife diagnosed with CHF   He will start watching his salt also     Referred to neuro as well   IBS gets triggered by stress  Symptoms are on and off  Trying to eat better    Patient Active Problem List   Diagnosis Date Noted   Class 1 obesity due to excess calories with body mass index (BMI) of 30.0 to 30.9 in adult 08/29/2023   Hepatic steatosis 08/08/2023   Elevated transaminase level 08/06/2023   Elevated blood pressure reading 08/06/2023   Other headache syndrome 09/09/2021   Chronic headaches 04/17/2019   IBS (irritable bowel syndrome) 01/04/2017   Former smoker 07/22/2015   Hypertriglyceridemia 03/13/2014   GERD (gastroesophageal reflux disease) 09/04/2011   History of kidney stones 03/12/2010   MICROSCOPIC HEMATURIA 03/12/2010   HYPERSOMNIA, PERSISTENT 03/15/2008   Allergic rhinitis 02/13/2008   DEPRESSION 04/27/2007    Past Medical History:  Diagnosis Date   Allergy    Depression    Environmental allergies    GERD (gastroesophageal reflux disease)    Other and unspecified noninfectious gastroenteritis and colitis(558.9)    Other malaise and fatigue    Panic attack    Renal stones    Tobacco use disorder    Unspecified sleep apnea    Past Surgical History:  Procedure Laterality Date   neg hx     Social History   Tobacco Use   Smoking status: Former    Current packs/day: 0.00    Average packs/day: 1 pack/day for 15.0 years (15.0 ttl pk-yrs)    Types: Cigarettes    Start date: 09/04/2005    Quit date: 09/04/2020    Years since quitting: 2.9   Smokeless tobacco: Former    Types: Snuff    Quit date: 10/20/2011  Vaping Use   Vaping status: Former  Substance Use Topics   Alcohol use: Not Currently    Alcohol/week: 0.0 standard drinks of alcohol   Drug use: No   Family History  Problem Relation Age of Onset   Asthma Mother    Hypertension Father    Heart disease Paternal Grandmother    Diabetes Paternal Grandmother    Lactose intolerance Daughter    Bone cancer  Paternal Grandfather    Colon cancer Neg Hx    Esophageal cancer Neg Hx    Pancreatic cancer Neg Hx    Stomach cancer Neg Hx    Kidney disease Neg Hx    Liver disease Neg Hx    Allergies  Allergen Reactions   Chantix [Varenicline] Shortness Of Breath   Betadine [Povidone Iodine] Rash   Current Outpatient Medications on File Prior to Visit  Medication Sig Dispense Refill   famotidine (PEPCID) 20 MG tablet Take 1 tablet (20 mg total) by mouth 2 (two) times daily. 180 tablet 1   fluticasone (FLONASE) 50 MCG/ACT nasal spray Place 1 spray into both nostrils daily. 16 g 0   glycopyrrolate (ROBINUL) 2 MG tablet TAKE 1 TABLET (2 MG TOTAL) BY MOUTH 2 (TWO) TIMES DAILY. AS NEEDED FOR ABDOMINAL PAIN 180 tablet 1   hyoscyamine (LEVSIN/SL) 0.125 MG SL tablet Place 2 tablets (0.25 mg total) under the tongue 4 (four) times daily as  needed for cramping. 30 tablet 1   levocetirizine (XYZAL) 5 MG tablet Take 5 mg by mouth every evening.     meclizine (ANTIVERT) 25 MG tablet Take 1 tablet (25 mg total) by mouth 3 (three) times daily as needed for dizziness. 30 tablet 0   metoprolol succinate (TOPROL-XL) 25 MG 24 hr tablet Take 1 tablet (25 mg total) by mouth daily. With food 90 tablet 3   pantoprazole (PROTONIX) 40 MG tablet Take 1 tablet (40 mg total) by mouth 2 (two) times daily. 180 tablet 3   rizatriptan (MAXALT-MLT) 10 MG disintegrating tablet Take 1 tablet (10 mg total) by mouth as needed for migraine. May repeat in 2 hours if needed 10 tablet 3   No current facility-administered medications on file prior to visit.    Review of Systems  Constitutional:  Negative for activity change, appetite change, fatigue, fever and unexpected weight change.  HENT:  Negative for congestion, rhinorrhea, sore throat and trouble swallowing.   Eyes:  Negative for pain, redness, itching and visual disturbance.  Respiratory:  Negative for cough, chest tightness, shortness of breath and wheezing.   Cardiovascular:  Negative for chest pain and palpitations.  Gastrointestinal:  Negative for abdominal pain, blood in stool, constipation, diarrhea and nausea.       Bloating Cramping  Intermittent   Endocrine: Negative for cold intolerance, heat intolerance, polydipsia and polyuria.  Genitourinary:  Negative for difficulty urinating, dysuria, frequency and urgency.  Musculoskeletal:  Negative for arthralgias, joint swelling and myalgias.  Skin:  Negative for pallor and rash.  Neurological:  Positive for headaches. Negative for dizziness, tremors, weakness and numbness.  Hematological:  Negative for adenopathy. Does not bruise/bleed easily.  Psychiatric/Behavioral:  Negative for decreased concentration and dysphoric mood. The patient is not nervous/anxious.        Objective:   Physical Exam Constitutional:      General: He is not in  acute distress.    Appearance: Normal appearance. He is well-developed. He is obese. He is not ill-appearing or diaphoretic.  HENT:     Head: Normocephalic and atraumatic.  Eyes:     Conjunctiva/sclera: Conjunctivae normal.     Pupils: Pupils are equal, round, and reactive to light.  Neck:     Thyroid: No thyromegaly.     Vascular: No carotid bruit or JVD.  Cardiovascular:     Rate and Rhythm: Normal rate and regular rhythm.     Heart sounds: Normal heart sounds.     No gallop.  Pulmonary:     Effort: Pulmonary effort is normal. No respiratory distress.     Breath sounds: Normal breath sounds. No wheezing or rales.  Abdominal:     General: There is no distension or abdominal bruit.     Palpations: Abdomen is soft.  Musculoskeletal:     Cervical back: Normal range of motion and neck supple.     Right lower leg: No edema.     Left lower leg: No edema.  Lymphadenopathy:     Cervical: No cervical adenopathy.  Skin:    General: Skin is warm and dry.     Coloration: Skin is not pale.     Findings: No rash.  Neurological:     Mental Status: He is alert.     Cranial Nerves: No cranial nerve deficit.     Motor: No weakness.     Coordination: Coordination normal.     Gait: Gait normal.     Deep Tendon Reflexes: Reflexes are normal and symmetric. Reflexes normal.  Psychiatric:        Mood and Affect: Mood normal.           Assessment & Plan:   Problem List Items Addressed This Visit       Digestive   Hepatic steatosis    Encouraged weight loss       IBS (irritable bowel syndrome)    GI appointment is upcoming in January Triggered by stress  Continues hyoscamine and glycopyrrolate prn        Other   Class 1 obesity due to excess calories with body mass index (BMI) of 30.0 to 30.9 in adult    Discussed how this problem influences overall health and the risks it imposes  Reviewed plan for weight loss with lower calorie diet (via better food choices (lower glycemic  and portion control) along with exercise building up to or more than 30 minutes 5 days per week including some aerobic activity and strength training          Elevated blood pressure reading    Improved with toprol xl 25 mg daily (given for headache prevention)  Less HA and improved blood pressure Discussed DASH eating guidelines as well  Working on weight loss       Other headache syndrome - Primary    Overall improved/less frequent with addition of toprol xl 25 mg daily  Blood pressure is better also   Continues gabapentin for prophylaxis 300 mg bid  Maxalt for rescule  Working to avoid caffeine unless during headache      Relevant Medications   gabapentin (NEURONTIN) 300 MG capsule

## 2023-08-29 DIAGNOSIS — E669 Obesity, unspecified: Secondary | ICD-10-CM | POA: Insufficient documentation

## 2023-08-29 DIAGNOSIS — E6609 Other obesity due to excess calories: Secondary | ICD-10-CM | POA: Insufficient documentation

## 2023-08-29 NOTE — Assessment & Plan Note (Signed)
Improved with toprol xl 25 mg daily (given for headache prevention)  Less HA and improved blood pressure Discussed DASH eating guidelines as well  Working on weight loss

## 2023-08-29 NOTE — Assessment & Plan Note (Signed)
GI appointment is upcoming in January Triggered by stress  Continues hyoscamine and glycopyrrolate prn

## 2023-08-29 NOTE — Assessment & Plan Note (Signed)
 Discussed how this problem influences overall health and the risks it imposes  Reviewed plan for weight loss with lower calorie diet (via better food choices (lower glycemic and portion control) along with exercise building up to or more than 30 minutes 5 days per week including some aerobic activity and strength training

## 2023-08-29 NOTE — Assessment & Plan Note (Signed)
Overall improved/less frequent with addition of toprol xl 25 mg daily  Blood pressure is better also   Continues gabapentin for prophylaxis 300 mg bid  Maxalt for rescule  Working to avoid caffeine unless during headache

## 2023-08-29 NOTE — Assessment & Plan Note (Signed)
Encouraged weight loss 

## 2023-09-29 ENCOUNTER — Ambulatory Visit
Admission: RE | Admit: 2023-09-29 | Discharge: 2023-09-29 | Disposition: A | Discharge status: Home / Self Care | Payer: BC Managed Care – PPO | Source: Ambulatory Visit | Attending: Family Medicine | Admitting: Family Medicine

## 2023-09-29 ENCOUNTER — Encounter: Payer: Self-pay | Admitting: Family Medicine

## 2023-09-29 ENCOUNTER — Ambulatory Visit: Payer: BC Managed Care – PPO | Admitting: Family Medicine

## 2023-09-29 VITALS — BP 138/84 | HR 77 | Temp 98.2°F | Ht 69.0 in | Wt 208.0 lb

## 2023-09-29 DIAGNOSIS — R1031 Right lower quadrant pain: Secondary | ICD-10-CM

## 2023-09-29 DIAGNOSIS — R112 Nausea with vomiting, unspecified: Secondary | ICD-10-CM | POA: Insufficient documentation

## 2023-09-29 DIAGNOSIS — R1033 Periumbilical pain: Secondary | ICD-10-CM | POA: Diagnosis not present

## 2023-09-29 DIAGNOSIS — R197 Diarrhea, unspecified: Secondary | ICD-10-CM | POA: Insufficient documentation

## 2023-09-29 DIAGNOSIS — K760 Fatty (change of) liver, not elsewhere classified: Secondary | ICD-10-CM | POA: Diagnosis not present

## 2023-09-29 DIAGNOSIS — K429 Umbilical hernia without obstruction or gangrene: Secondary | ICD-10-CM | POA: Diagnosis not present

## 2023-09-29 LAB — BASIC METABOLIC PANEL
BUN: 16 mg/dL (ref 6–23)
CO2: 29 meq/L (ref 19–32)
Calcium: 9.1 mg/dL (ref 8.4–10.5)
Chloride: 104 meq/L (ref 96–112)
Creatinine, Ser: 1.27 mg/dL (ref 0.40–1.50)
GFR: 71.56 mL/min (ref >60.00)
Glucose, Bld: 111 mg/dL — ABNORMAL HIGH (ref 70–99)
Potassium: 4.4 meq/L (ref 3.5–5.1)
Sodium: 139 meq/L (ref 135–145)

## 2023-09-29 LAB — CBC WITH DIFFERENTIAL/PLATELET
Basophils Absolute: 0 10*3/uL (ref 0.0–0.1)
Basophils Relative: 0.2 % (ref 0.0–3.0)
Eosinophils Absolute: 0.5 10*3/uL (ref 0.0–0.7)
Eosinophils Relative: 4.7 % (ref 0.0–5.0)
HCT: 51.2 % (ref 39.0–52.0)
Hemoglobin: 17.5 g/dL — ABNORMAL HIGH (ref 13.0–17.0)
Lymphocytes Relative: 23.8 % (ref 12.0–46.0)
Lymphs Abs: 2.7 10*3/uL (ref 0.7–4.0)
MCHC: 34.2 g/dL (ref 30.0–36.0)
MCV: 87.4 fL (ref 78.0–100.0)
Monocytes Absolute: 0.7 10*3/uL (ref 0.1–1.0)
Monocytes Relative: 6.4 % (ref 3.0–12.0)
Neutro Abs: 7.5 10*3/uL (ref 1.4–7.7)
Neutrophils Relative %: 64.9 % (ref 43.0–77.0)
Platelets: 242 10*3/uL (ref 150.0–400.0)
RBC: 5.86 Mil/uL — ABNORMAL HIGH (ref 4.22–5.81)
RDW: 12.7 % (ref 11.5–15.5)
WBC: 11.5 10*3/uL — ABNORMAL HIGH (ref 4.0–10.5)

## 2023-09-29 LAB — HEPATIC FUNCTION PANEL
ALT: 43 U/L (ref 0–53)
AST: 22 U/L (ref 0–37)
Albumin: 4.5 g/dL (ref 3.5–5.2)
Alkaline Phosphatase: 95 U/L (ref 39–117)
Bilirubin, Direct: 0 mg/dL (ref 0.0–0.3)
Total Bilirubin: 0.4 mg/dL (ref 0.2–1.2)
Total Protein: 7 g/dL (ref 6.0–8.3)

## 2023-09-29 LAB — LIPASE: Lipase: 12 U/L (ref 11.0–59.0)

## 2023-09-29 MED ORDER — IOHEXOL 300 MG/ML  SOLN
100.0000 mL | Freq: Once | INTRAMUSCULAR | Status: AC | PRN
  Administered 2023-09-29: 100 mL via INTRAVENOUS

## 2023-09-29 MED ORDER — ONDANSETRON HCL 4 MG PO TABS: 4.0000 mg | ORAL_TABLET | Freq: Three times a day (TID) | ORAL | 0 refills | Status: DC | PRN

## 2023-09-29 NOTE — Addendum Note (Signed)
Addended by: Roxy Manns A on: 09/29/2023 03:17 PM   Modules accepted: Orders

## 2023-09-29 NOTE — Patient Instructions (Addendum)
Stat labs now  We will get report later today  We may consider imaging    Try generic zofran for nausea   Sip fluids to prevent dehydration   If symptoms worsen -go to the ER   Take care of yourself

## 2023-09-29 NOTE — Progress Notes (Signed)
Subjective:    Patient ID: Donald Merritt, male    DOB: June 04, 1986, 37 y.o.   MRN: [id number]  HPI  Wt Readings from Last 3 Encounters:  09/29/23 208 lb (94.3 kg)  08/27/23 208 lb 6 oz (94.5 kg)  08/06/23 205 lb (93 kg)   30.72 kg/m  Vitals:   09/29/23 0945  BP: 138/84  Pulse: 77  Temp: 98.2 F (36.8 C)  SpO2: 97%   Pt presents with GI complaints N/v/d and abd pain   Has IBS- worsening over past month  Has appointment with GI on jan 17th   Since last visit a lot more GI problems  Abdomen feels tight and full Then started to hurt  Vomiting - started 3 weeks ago /now worse   Yesterday vomited 10 times  Took over the counter anti vomiting medicine (?) - helped some    Foul smelling burping at times  Really bad taste in his mouth   Was constipated (maybe)  Now diarrhea  No blood in stool   Now some abdominal pain  Changes position - was around umbilicus for a while  Right side bothers him also     Tried  Stool softener before the diarrhea started    Lab Results  Component Value Date   WBC 10.5 07/03/2023   HGB 16.7 07/03/2023   HCT 47.6 07/03/2023   MCV 84.1 07/03/2023   PLT 238 07/03/2023   Lab Results  Component Value Date   NA 140 07/03/2023   K 3.6 07/03/2023   CO2 23 07/03/2023   GLUCOSE 99 07/03/2023   BUN 6 07/03/2023   CREATININE 1.08 07/03/2023   CALCIUM 9.1 07/03/2023   GFR 90.76 10/09/2020   EGFR 84 12/01/2021   GFRNONAA >60 07/03/2023   Lab Results  Component Value Date   TSH 1.25 04/19/2020   Lab Results  Component Value Date   ALT 57 (H) 08/06/2023   AST 31 08/06/2023   ALKPHOS 79 07/03/2023   BILITOT 0.6 08/06/2023   Lab Results  Component Value Date   LIPASE 25 07/03/2023      Patient Active Problem List   Diagnosis Date Noted   Nausea & vomiting 09/29/2023   Diarrhea 09/29/2023   Periumbilical abdominal pain 09/29/2023   Class 1 obesity due to excess calories with body mass index (BMI) of 30.0 to 30.9 in  adult 08/29/2023   Hepatic steatosis 08/08/2023   Elevated transaminase level 08/06/2023   Elevated blood pressure reading 08/06/2023   Other headache syndrome 09/09/2021   Chronic headaches 04/17/2019   IBS (irritable bowel syndrome) 01/04/2017   Former smoker 07/22/2015   Hypertriglyceridemia 03/13/2014   GERD (gastroesophageal reflux disease) 09/04/2011   History of kidney stones 03/12/2010   MICROSCOPIC HEMATURIA 03/12/2010   HYPERSOMNIA, PERSISTENT 03/15/2008   Allergic rhinitis 02/13/2008   DEPRESSION 04/27/2007   Past Medical History:  Diagnosis Date   Allergy    Depression    Environmental allergies    GERD (gastroesophageal reflux disease)    Other and unspecified noninfectious gastroenteritis and colitis(558.9)    Other malaise and fatigue    Panic attack    Renal stones    Tobacco use disorder    Unspecified sleep apnea    Past Surgical History:  Procedure Laterality Date   neg hx     Social History   Tobacco Use   Smoking status: Former    Current packs/day: 0.00    Average packs/day: 1 pack/day for 15.0 years (  15.0 ttl pk-yrs)    Types: Cigarettes    Start date: 09/04/2005    Quit date: 09/04/2020    Years since quitting: 3.0   Smokeless tobacco: Former    Types: Snuff    Quit date: 10/20/2011  Vaping Use   Vaping status: Former  Substance Use Topics   Alcohol use: Not Currently    Alcohol/week: 0.0 standard drinks of alcohol   Drug use: No   Family History  Problem Relation Age of Onset   Asthma Mother    Hypertension Father    Heart disease Paternal Grandmother    Diabetes Paternal Grandmother    Lactose intolerance Daughter    Bone cancer Paternal Grandfather    Colon cancer Neg Hx    Esophageal cancer Neg Hx    Pancreatic cancer Neg Hx    Stomach cancer Neg Hx    Kidney disease Neg Hx    Liver disease Neg Hx    Allergies  Allergen Reactions   Chantix [Varenicline] Shortness Of Breath   Betadine [Povidone Iodine] Rash   Current  Outpatient Medications on File Prior to Visit  Medication Sig Dispense Refill   famotidine (PEPCID) 20 MG tablet Take 1 tablet (20 mg total) by mouth 2 (two) times daily. 180 tablet 1   fluticasone (FLONASE) 50 MCG/ACT nasal spray Place 1 spray into both nostrils daily. 16 g 0   gabapentin (NEURONTIN) 300 MG capsule Take 1 capsule (300 mg total) by mouth 2 (two) times daily. 180 capsule 2   glycopyrrolate (ROBINUL) 2 MG tablet TAKE 1 TABLET (2 MG TOTAL) BY MOUTH 2 (TWO) TIMES DAILY. AS NEEDED FOR ABDOMINAL PAIN 180 tablet 1   hyoscyamine (LEVSIN/SL) 0.125 MG SL tablet Place 2 tablets (0.25 mg total) under the tongue 4 (four) times daily as needed for cramping. 30 tablet 1   levocetirizine (XYZAL) 5 MG tablet Take 5 mg by mouth every evening.     meclizine (ANTIVERT) 25 MG tablet Take 1 tablet (25 mg total) by mouth 3 (three) times daily as needed for dizziness. 30 tablet 0   metoprolol succinate (TOPROL-XL) 25 MG 24 hr tablet Take 1 tablet (25 mg total) by mouth daily. With food 90 tablet 3   pantoprazole (PROTONIX) 40 MG tablet Take 1 tablet (40 mg total) by mouth 2 (two) times daily. 180 tablet 3   rizatriptan (MAXALT-MLT) 10 MG disintegrating tablet Take 1 tablet (10 mg total) by mouth as needed for migraine. May repeat in 2 hours if needed 10 tablet 3   No current facility-administered medications on file prior to visit.    Review of Systems  Constitutional:  Positive for fatigue. Negative for activity change, appetite change, fever and unexpected weight change.  HENT:  Negative for congestion, rhinorrhea, sore throat and trouble swallowing.   Eyes:  Negative for pain, redness, itching and visual disturbance.  Respiratory:  Negative for cough, chest tightness, shortness of breath and wheezing.   Cardiovascular:  Negative for chest pain and palpitations.  Gastrointestinal:  Positive for abdominal distention, abdominal pain, diarrhea, nausea and vomiting. Negative for anal bleeding, blood in  stool, constipation and rectal pain.  Endocrine: Negative for cold intolerance, heat intolerance, polydipsia and polyuria.  Genitourinary:  Negative for difficulty urinating, dysuria, frequency and urgency.  Musculoskeletal:  Negative for arthralgias, joint swelling and myalgias.  Skin:  Negative for pallor and rash.  Neurological:  Negative for dizziness, tremors, weakness, numbness and headaches.  Hematological:  Negative for adenopathy. Does not bruise/bleed easily.  Psychiatric/Behavioral:  Negative for decreased concentration and dysphoric mood. The patient is not nervous/anxious.        Objective:   Physical Exam Constitutional:      General: He is not in acute distress.    Appearance: Normal appearance. He is well-developed. He is obese. He is not ill-appearing or diaphoretic.  HENT:     Head: Normocephalic and atraumatic.     Mouth/Throat:     Mouth: Mucous membranes are moist.  Eyes:     General: No scleral icterus.    Conjunctiva/sclera: Conjunctivae normal.     Pupils: Pupils are equal, round, and reactive to light.  Cardiovascular:     Rate and Rhythm: Normal rate and regular rhythm.     Heart sounds: Normal heart sounds.  Pulmonary:     Effort: Pulmonary effort is normal. No respiratory distress.     Breath sounds: Normal breath sounds. No stridor. No wheezing, rhonchi or rales.  Abdominal:     General: Abdomen is protuberant. Bowel sounds are increased. There is no distension.     Palpations: Abdomen is soft. There is no shifting dullness, fluid wave, hepatomegaly, splenomegaly, mass or pulsatile mass.     Tenderness: There is abdominal tenderness in the right upper quadrant, right lower quadrant and periumbilical area. There is no right CVA tenderness, left CVA tenderness, guarding or rebound. Negative signs include Murphy's sign and McBurney's sign.     Hernia: No hernia is present.     Comments: Bs are increased but not high pitched  Tenderness periumbilical but  also worse on right side   Tenderness is equal with press and let go  Some mild discomfort with movement  No rigidity      Musculoskeletal:     Cervical back: Normal range of motion and neck supple.  Lymphadenopathy:     Cervical: No cervical adenopathy.  Skin:    General: Skin is warm and dry.     Coloration: Skin is not jaundiced or pale.     Findings: No bruising or erythema.  Neurological:     Mental Status: He is alert.     Motor: No weakness.  Psychiatric:        Mood and Affect: Mood normal.           Assessment & Plan:   Problem List Items Addressed This Visit       Digestive   Nausea & vomiting    New in past 3 weeks  Also foul smelling burps  Is having bm (diarrhea) and also bs on exam so less likely bowel obstruction  Is able to keep down fluids in sips and some saltine crackers   Exam notes periumbilical pain worse on right  Reviewed last ER visit 06/2023 for similar symptoms -but vomiting is new   Sent zofran for nausea Sips of fluids  Reviewed signs and symptoms of dehydration  Stat labs now  Also stool tests  May consider imaging   Call back and Er precautions noted in detail today         Relevant Orders   CBC with Differential/Platelet   Basic metabolic panel   Lipase   Hepatic function panel     Other   Periumbilical abdominal pain - Primary    In setting of IBS Worse on R Acute on chronic but now with n/v/d which is more new  Tender on exam   Need to rule out appendicitis and infectious causes Stat labs Stool studies Pending -consider  imaging  Zofran prescription  Call back and Er precautions noted in detail today , see AVS         Relevant Orders   CBC with Differential/Platelet   Basic metabolic panel   Lipase   Hepatic function panel   Diarrhea    With n/v and peri umbilical pain and tenderness  Acute now / chronic in past in setting of IBS Tenderness on exam Stat labs Stool culture and c diff   Consider  imaging for pain  Pending results       Relevant Orders   CBC with Differential/Platelet   Basic metabolic panel   Lipase   Hepatic function panel   Gastrointestinal Pathogen Pnl RT, PCR   C. difficile GDH and Toxin A/B

## 2023-09-29 NOTE — Assessment & Plan Note (Signed)
New in past 3 weeks  Also foul smelling burps  Is having bm (diarrhea) and also bs on exam so less likely bowel obstruction  Is able to keep down fluids in sips and some saltine crackers   Exam notes periumbilical pain worse on right  Reviewed last ER visit 06/2023 for similar symptoms -but vomiting is new   Sent zofran for nausea Sips of fluids  Reviewed signs and symptoms of dehydration  Stat labs now  Also stool tests  May consider imaging   Call back and Er precautions noted in detail today

## 2023-09-29 NOTE — Assessment & Plan Note (Signed)
With n/v and peri umbilical pain and tenderness  Acute now / chronic in past in setting of IBS Tenderness on exam Stat labs Stool culture and c diff   Consider imaging for pain  Pending results

## 2023-09-29 NOTE — Assessment & Plan Note (Signed)
In setting of IBS Worse on R Acute on chronic but now with n/v/d which is more new  Tender on exam   Need to rule out appendicitis and infectious causes Stat labs Stool studies Pending -consider imaging  Zofran prescription  Call back and Er precautions noted in detail today , see AVS

## 2023-09-30 ENCOUNTER — Encounter: Payer: Self-pay | Admitting: Family Medicine

## 2023-09-30 ENCOUNTER — Other Ambulatory Visit: Payer: Self-pay | Admitting: Radiology

## 2023-09-30 DIAGNOSIS — R197 Diarrhea, unspecified: Secondary | ICD-10-CM | POA: Diagnosis not present

## 2023-10-01 LAB — C. DIFFICILE GDH AND TOXIN A/B
GDH ANTIGEN: NOT DETECTED
MICRO NUMBER:: 15843210
SPECIMEN QUALITY:: ADEQUATE
TOXIN A AND B: NOT DETECTED

## 2023-10-02 LAB — GASTROINTESTINAL PATHOGEN PNL
CampyloBacter Group: NOT DETECTED
Norovirus GI/GII: NOT DETECTED
Rotavirus A: NOT DETECTED
Salmonella species: NOT DETECTED
Shiga Toxin 1: NOT DETECTED
Shiga Toxin 2: NOT DETECTED
Shigella Species: NOT DETECTED
Vibrio Group: NOT DETECTED
Yersinia enterocolitica: NOT DETECTED

## 2023-11-05 ENCOUNTER — Encounter: Payer: Self-pay | Admitting: Physician Assistant

## 2023-11-05 ENCOUNTER — Ambulatory Visit (INDEPENDENT_AMBULATORY_CARE_PROVIDER_SITE_OTHER): Payer: BC Managed Care – PPO | Admitting: Physician Assistant

## 2023-11-05 ENCOUNTER — Other Ambulatory Visit (INDEPENDENT_AMBULATORY_CARE_PROVIDER_SITE_OTHER): Payer: BC Managed Care – PPO

## 2023-11-05 ENCOUNTER — Ambulatory Visit
Admission: RE | Admit: 2023-11-05 | Discharge: 2023-11-05 | Disposition: A | Discharge status: Home / Self Care | Payer: BC Managed Care – PPO | Source: Ambulatory Visit | Attending: Physician Assistant | Admitting: Physician Assistant

## 2023-11-05 VITALS — BP 144/100 | HR 76 | Ht 68.0 in | Wt 210.2 lb

## 2023-11-05 DIAGNOSIS — K219 Gastro-esophageal reflux disease without esophagitis: Secondary | ICD-10-CM | POA: Diagnosis not present

## 2023-11-05 DIAGNOSIS — R630 Anorexia: Secondary | ICD-10-CM

## 2023-11-05 DIAGNOSIS — K582 Mixed irritable bowel syndrome: Secondary | ICD-10-CM

## 2023-11-05 DIAGNOSIS — R197 Diarrhea, unspecified: Secondary | ICD-10-CM

## 2023-11-05 DIAGNOSIS — M7918 Myalgia, other site: Secondary | ICD-10-CM | POA: Diagnosis not present

## 2023-11-05 DIAGNOSIS — K760 Fatty (change of) liver, not elsewhere classified: Secondary | ICD-10-CM

## 2023-11-05 DIAGNOSIS — R109 Unspecified abdominal pain: Secondary | ICD-10-CM

## 2023-11-05 DIAGNOSIS — F32A Depression, unspecified: Secondary | ICD-10-CM

## 2023-11-05 DIAGNOSIS — R10A0 Flank pain, unspecified side: Secondary | ICD-10-CM

## 2023-11-05 DIAGNOSIS — K580 Irritable bowel syndrome with diarrhea: Secondary | ICD-10-CM | POA: Diagnosis not present

## 2023-11-05 DIAGNOSIS — R110 Nausea: Secondary | ICD-10-CM

## 2023-11-05 LAB — URINALYSIS, ROUTINE W REFLEX MICROSCOPIC
Bilirubin Urine: NEGATIVE
Hgb urine dipstick: NEGATIVE
Ketones, ur: NEGATIVE
Leukocytes,Ua: NEGATIVE
Nitrite: NEGATIVE
Specific Gravity, Urine: 1.02 (ref 1.000–1.030)
Total Protein, Urine: NEGATIVE
Urine Glucose: NEGATIVE
Urobilinogen, UA: 1 (ref 0.0–1.0)
pH: 7.5 (ref 5.0–8.0)

## 2023-11-05 LAB — CBC WITH DIFFERENTIAL/PLATELET
Basophils Absolute: 0.1 10*3/uL (ref 0.0–0.1)
Basophils Relative: 0.7 % (ref 0.0–3.0)
Eosinophils Absolute: 0.2 10*3/uL (ref 0.0–0.7)
Eosinophils Relative: 2.4 % (ref 0.0–5.0)
HCT: 49.1 % (ref 39.0–52.0)
Hemoglobin: 17 g/dL (ref 13.0–17.0)
Lymphocytes Relative: 22.5 % (ref 12.0–46.0)
Lymphs Abs: 2.3 10*3/uL (ref 0.7–4.0)
MCHC: 34.7 g/dL (ref 30.0–36.0)
MCV: 86.5 fL (ref 78.0–100.0)
Monocytes Absolute: 0.8 10*3/uL (ref 0.1–1.0)
Monocytes Relative: 7.5 % (ref 3.0–12.0)
Neutro Abs: 6.8 10*3/uL (ref 1.4–7.7)
Neutrophils Relative %: 66.9 % (ref 43.0–77.0)
Platelets: 257 10*3/uL (ref 150.0–400.0)
RBC: 5.67 Mil/uL (ref 4.22–5.81)
RDW: 13.2 % (ref 11.5–15.5)
WBC: 10.3 10*3/uL (ref 4.0–10.5)

## 2023-11-05 LAB — COMPREHENSIVE METABOLIC PANEL
ALT: 75 U/L — ABNORMAL HIGH (ref 0–53)
AST: 27 U/L (ref 0–37)
Albumin: 4.7 g/dL (ref 3.5–5.2)
Alkaline Phosphatase: 93 U/L (ref 39–117)
BUN: 10 mg/dL (ref 6–23)
CO2: 30 meq/L (ref 19–32)
Calcium: 9.8 mg/dL (ref 8.4–10.5)
Chloride: 102 meq/L (ref 96–112)
Creatinine, Ser: 1.11 mg/dL (ref 0.40–1.50)
GFR: 84.05 mL/min (ref >60.00)
Glucose, Bld: 89 mg/dL (ref 70–99)
Potassium: 4.1 meq/L (ref 3.5–5.1)
Sodium: 139 meq/L (ref 135–145)
Total Bilirubin: 0.6 mg/dL (ref 0.2–1.2)
Total Protein: 7.3 g/dL (ref 6.0–8.3)

## 2023-11-05 LAB — SEDIMENTATION RATE: Sed Rate: 8 mm/h (ref 0–15)

## 2023-11-05 LAB — C-REACTIVE PROTEIN: CRP: <1 mg/dL (ref 0.5–20.0)

## 2023-11-05 MED ORDER — SUFLAVE 178.7 G PO SOLR: 1.0000 | Freq: Once | ORAL | 0 refills | Status: AC

## 2023-11-05 NOTE — Progress Notes (Signed)
11/05/2023 Donald Merritt [id number] Apr 23, 1986  Referring provider: Judy Pimple, MD Primary GI doctor: Dr. Doy Hutching (Dr. Russella Dar)  ASSESSMENT AND PLAN:   Abdominal Pain and Diarrhea Longstanding history with previous workup in 2019 showed small polyp, patient is due this year. Multiple CT scans with no abnormalities, HIDA scan unremarkable. Persistent symptoms for over two months with associated nausea and decreased appetite. No nocturnal diarrhea. No weight loss or hematochezia.  -Schedule repeat colonoscopy and endoscopy due to persistent symptoms and worsening reflux. Risk of bowel prep, conscious sedation, and EGD and colonoscopy were discussed.  Risks include but are not limited to dehydration, pain, bleeding, cardiopulmonary process, bowel perforation, or other possible adverse outcomes..  Treatment plan was discussed with patient, and agreed upon. -Order abdominal x-ray and labs to assess for possible kidney stone and stool burden, patient has right flank pain has history of kidney stones, and has decreased bowel sounds on exam. -Continue Imodium as needed for diarrhea control. -Right upper quadrant ultrasound to evaluate gallbladder with some right upper quadrant pain on exam.  Musculoskeletal Pain Pain in the right lower quadrant and right upper quadrant with associated hip and back pain. Pain exacerbated by certain movements and positions during exam. -Recommend over-the-counter Salonpas patches and Tylenol for pain management. -Encourage increased mobility and activity to prevent musculoskeletal stiffness and pain. -Follow-up with primary care for further evaluation may need x-rays or CT.  Gastroesophageal Reflux Disease (GERD) Worsening symptoms despite being on pantoprazole. Previous endoscopy in 2019 was normal. -Continue pantoprazole for acid reflux control twice daily. -Repeat endoscopy during upcoming colonoscopy to reassess esophageal and gastric  condition.  Depression Recent onset associated with ongoing health concerns, denies depression prior to this. -Flat affect on exam has not been able to work for 2 months, I do question with patient's persistent abdominal discomfort, reflux and symptoms if this is not functional/IBS -Recommend follow-up with primary care provider for further evaluation and management.  May want to consider amitriptyline/SNRI     Patient Care Team: Tower, Audrie Gallus, MD as PCP - General  HISTORY OF PRESENT ILLNESS: 38 y.o. male with a past medical history of GERD, IBS, hepatic steatosis, microscopic hematuria, history of kidney stones, chronic headaches, obesity, periumbilical and right flank abdominal pain, diarrhea and others listed below presents for evaluation of IBS.   Patient with longstanding history of IBS and abdominal discomfort previously seen by Dr. Russella Dar. Last colonoscopy endoscopy in 2019 for abdominal pain, weight loss early satiety change in bowel habits found 6 mm polyp otherwise unremarkable, normal TI.   EGD showed normal esophagus and duodenum mild gastritis. Pathology showed sessile serrated polyp and negative H. pylori benign mucosa.   Patient had workup for abdominal pain and diarrhea December 2024.   CT was unremarkable bowel, did show hepatic steatosis which was previously seen and 2019. Labs were negative for GI pathogen, C. difficile, lipase, WBC 11.5, hemoglobin 17.5, normal platelets, kidney function unremarkable, liver function normal but has had previous minor ovation's of ALT of 57 and 63   Discussed the use of AI scribe software for clinical note transcription with the patient, who gave verbal consent to proceed.  History of Present Illness   The patient, with a past medical history of esophagitis and kidney stones, presents with worsening abdominal pain, diarrhea, and nausea that has been ongoing for over two months. The abdominal pain is located in the right side and extends to  the back, and is worse at night. The  patient also reports discomfort during urination. The patient's symptoms have led to a significant decrease in his food intake and have kept him largely bedridden, which has resulted in musculoskeletal discomfort. The patient's symptoms have not improved with the use of Imodium and other medications but he has only taken 4 doses. The patient also reports an increase in acid reflux symptoms, for which he is currently on medication. The patient denies any weight loss or blood in the stool. The patient's symptoms have caused significant distress, leading to feelings of depression and not being able to work for 2 months.   He  reports that he quit smoking about 3 years ago. His smoking use included cigarettes. He started smoking about 18 years ago. He has a 15 pack-year smoking history. He quit smokeless tobacco use about 12 years ago.  His smokeless tobacco use included snuff. He reports that he does not currently use alcohol. He reports that he does not use drugs.  RELEVANT GI HISTORY, LABS, IMAGING:  09/29/2023 CT abdomen pelvis with contrast for right lower quadrant abdominal pain 1. No acute intra-abdominal or pelvic pathology. 2. Fatty liver. 12/01/2021 CT abdomen pelvis with out contrast right lower quadrant abdominal pain No acute findings in the abdomen or pelvis. Specifically, no findings to explain the patient's history of right lower quadrant pain. 11/12/2020 HIDA for abdominal pain normal gallbladder ejection fraction 63% normal  hepatobiliary scan 07/29/2018 endoscopy for GERD, weight loss early satiety abdominal pain - Normal esophagus. - Erythematous mucosa in the gastric body and antrum. Biopsied. - Normal duodenal bulb and second portion of the duodenum. PATH negative H. pylori, benign mucosa 07/29/2018 colonoscopy  for change in bowel habits abdominal pain weight loss - One 6 mm polyp in the cecum, removed with a cold snare. Resected and  retrieved. - The examined portion of the ileum was normal. - The examination was otherwise normal on direct and retroflexion views. PATH sessile serrated polyp recall 5 years  CBC    Component Value Date/Time   WBC 11.5 (H) 09/29/2023 1021   RBC 5.86 (H) 09/29/2023 1021   HGB 17.5 (H) 09/29/2023 1021   HCT 51.2 09/29/2023 1021   PLT 242.0 09/29/2023 1021   MCV 87.4 09/29/2023 1021   MCH 29.5 07/03/2023 1937   MCHC 34.2 09/29/2023 1021   RDW 12.7 09/29/2023 1021   LYMPHSABS 2.7 09/29/2023 1021   MONOABS 0.7 09/29/2023 1021   EOSABS 0.5 09/29/2023 1021   BASOSABS 0.0 09/29/2023 1021   Recent Labs    01/26/23 0954 07/03/23 1937 09/29/23 1021  HGB 16.2 16.7 17.5*    CMP     Component Value Date/Time   NA 139 09/29/2023 1021   K 4.4 09/29/2023 1021   CL 104 09/29/2023 1021   CO2 29 09/29/2023 1021   GLUCOSE 111 (H) 09/29/2023 1021   BUN 16 09/29/2023 1021   CREATININE 1.27 09/29/2023 1021   CREATININE 1.16 12/01/2021 1045   CREATININE 1.16 12/01/2021 1045   CALCIUM 9.1 09/29/2023 1021   PROT 7.0 09/29/2023 1021   ALBUMIN 4.5 09/29/2023 1021   AST 22 09/29/2023 1021   ALT 43 09/29/2023 1021   ALKPHOS 95 09/29/2023 1021   BILITOT 0.4 09/29/2023 1021   GFRNONAA >60 07/03/2023 1937   GFRAA >60 04/10/2019 1031      Latest Ref Rng & Units 09/29/2023   10:21 AM 08/06/2023    2:38 PM 07/03/2023    7:37 PM  Hepatic Function  Total Protein 6.0 - 8.3  g/dL 7.0  7.2  6.7   Albumin 3.5 - 5.2 g/dL 4.5   4.1   AST 0 - 37 U/L 22  31  32   ALT 0 - 53 U/L 43  57  63   Alk Phosphatase 39 - 117 U/L 95   79   Total Bilirubin 0.2 - 1.2 mg/dL 0.4  0.6  0.5   Bilirubin, Direct 0.0 - 0.3 mg/dL 0.0  0.1        Current Medications:    Current Outpatient Medications (Cardiovascular):    metoprolol succinate (TOPROL-XL) 25 MG 24 hr tablet, Take 1 tablet (25 mg total) by mouth daily. With food  Current Outpatient Medications (Respiratory):    fluticasone (FLONASE) 50 MCG/ACT nasal  spray, Place 1 spray into both nostrils daily. (Patient taking differently: Place 1 spray into both nostrils as needed.)   levocetirizine (XYZAL) 5 MG tablet, Take 5 mg by mouth every evening.  Current Outpatient Medications (Analgesics):    rizatriptan (MAXALT-MLT) 10 MG disintegrating tablet, Take 1 tablet (10 mg total) by mouth as needed for migraine. May repeat in 2 hours if needed   Current Outpatient Medications (Other):    famotidine (PEPCID) 20 MG tablet, Take 1 tablet (20 mg total) by mouth 2 (two) times daily.   gabapentin (NEURONTIN) 300 MG capsule, Take 1 capsule (300 mg total) by mouth 2 (two) times daily.   glycopyrrolate (ROBINUL) 2 MG tablet, TAKE 1 TABLET (2 MG TOTAL) BY MOUTH 2 (TWO) TIMES DAILY. AS NEEDED FOR ABDOMINAL PAIN   hyoscyamine (LEVSIN/SL) 0.125 MG SL tablet, Place 2 tablets (0.25 mg total) under the tongue 4 (four) times daily as needed for cramping.   loperamide (IMODIUM A-D) 2 MG tablet, Take 2 mg by mouth as needed for diarrhea or loose stools.   ondansetron (ZOFRAN) 4 MG tablet, Take 1 tablet (4 mg total) by mouth every 8 (eight) hours as needed for nausea or vomiting. Caution of sedation   pantoprazole (PROTONIX) 40 MG tablet, Take 1 tablet (40 mg total) by mouth 2 (two) times daily.   PEG 3350-KCl-NaCl-NaSulf-MgSul (SUFLAVE) 178.7 g SOLR, Take 1 each by mouth once for 1 dose. 12/06/2023 procedure   PEPPERMINT OIL PO, Take 1 Dose by mouth as needed.  Medical History:  Past Medical History:  Diagnosis Date   Allergy    Depression    Environmental allergies    GERD (gastroesophageal reflux disease)    Other and unspecified noninfectious gastroenteritis and colitis(558.9)    Other malaise and fatigue    Panic attack    Renal stones    Tobacco use disorder    Unspecified sleep apnea    Allergies:  Allergies  Allergen Reactions   Chantix [Varenicline] Shortness Of Breath   Betadine [Povidone Iodine] Rash     Surgical History:  He  has a past  surgical history that includes neg hx. Family History:  His family history includes Asthma in his mother; Bone cancer in his paternal grandfather; Diabetes in his paternal grandmother; Heart disease in his paternal grandmother; Hypertension in his father; Lactose intolerance in his daughter.  REVIEW OF SYSTEMS  : All other systems reviewed and negative except where noted in the History of Present Illness.  PHYSICAL EXAM: BP (!) 144/100 (BP Location: Left Arm, Patient Position: Sitting, Cuff Size: Normal)   Pulse 76   Ht 5\' 8"  (1.727 m)   Wt 210 lb 4 oz (95.4 kg)   BMI 31.97 kg/m  General Appearance: Well nourished,  in no apparent distress. Head:   Normocephalic and atraumatic. Eyes:  sclerae anicteric,conjunctive pink  Respiratory: Respiratory effort normal, BS equal bilaterally without rales, rhonchi, wheezing. Cardio: RRR with no MRGs. Peripheral pulses intact.  Abdomen: Soft,  Obese ,active bowel sounds. Mild to mod tenderness in the RUQ, in the RLQ, and in the right flank. Without guarding and Without rebound. No masses.  No rashes seen on flank Rectal: Not evaluated Musculoskeletal: Full ROM, pain with palpation of paraspinous muscles on right side, pain with lumbar flexion and right hip rotation ,Normal gait. Without edema. Skin:  Dry and intact without significant lesions or rashes, Neuro: Alert and  oriented x4;  No focal deficits. Psych:  Cooperative.  Very flat affect, possibly depressed mood   Doree Albee, PA-C 3:52 PM

## 2023-11-05 NOTE — Patient Instructions (Signed)
You will receive your bowel preparation through [facility], which ensures the lowest copay and home delivery, with outreach via text or call from an [phone] number. Please respond promptly to avoid rescheduling. If you are interested in alternative options or have any questions please contact them at [phone]  Your Provider Has Sent Your Bowel Prep Regimen To [facility] What to expect. [facility] will contact you to verify your information and collect your copay, if applicable. Enjoy the comfort of your home while we deliver your prescription to you, free of any shipping charges. Fast, FREE delivery or shipping. [facility] accepts all major insurance benefits and applies discounts & coupons  Have additional questions? [facility]'s patient care team is always here to help.  Chat: [email or web address] Call: [phone] Email: [email or web address] NCPDP: [id number] How will we contact you? Welcome Phone call  a Welcome text and a Checkout link in a text Texts you receive from [phone] Are Not Spam.   *To set up delivery, you must complete the checkout process via link or speak to one of our patient care representatives. If we are unable to reach you, your prescription may be delayed.  You have been scheduled for an abdominal ultrasound at [facility] (1st floor of hospital) on 11/11/2023 at 7:00am. Please arrive 30 minutes prior to your appointment for registration. Make certain not to have anything to eat or drink 6 hours prior to your appointment. Should you need to reschedule your appointment, please contact radiology at [phone]. This test typically takes about 30 minutes to perform.   Your provider has requested that you go to the basement level for lab work before leaving today. Press "B" on the elevator. The lab is located at the first door on the left as you exit the elevator.   Your provider has requested that you have an abdominal x ray before leaving  today. Please go to the basement floor to our Radiology department for the test.  - Can try loperamide 4 mg initially, then 2 mg after each unformed stool for = 2 days, with a maximum of 16 mg/day.   - If loperamide is not working, you could try bismuth salicylate (Pepto-Bismol) 30 mL or two tablets every 30 minutes for eight doses. Pepto-Bismol may make your stools black.   No aleve, ibuprofen, goody powders, as these are antiinflammatories and can cause inflammation in your stomach, increase bleeding risk and cause ulcers.  You can talk with PCP about alternative pain options.  Can do tyelnol max 3000 mg a day, salon pas patches are over the counter and voltern gel is topical antiinflammatory that is safe.   Consider follow up PCP for back/right hip  Go to the ER if any severe abdominal pain, fever, or weakness  First do a trial off milk/lactose products if you use them.  Add fiber like benefiber or citracel once a day Increase activity Can do trial of IBGard which is over the counter for AB pain- Take 1-2 capsules once a day for maintence or twice a day during a flare Can send in an anti spasm medication, Levsin, to take as needed Please try to decrease stress. consider talking with PCP about anti anxiety medication or try head space app for meditation. if any worsening symptoms like blood in stool, weight loss, please call the office     FODMAP stands for fermentable oligo-, di-, mono-saccharides and polyols (1). These are the scientific terms used to classify groups of carbs that are difficult for our body  to digest and that are notorious for triggering digestive symptoms like bloating, gas, loose stools and stomach pain.   You can try low FODMAP diet  - start with eliminating just one column at a time that you feel may be a trigger for you. - the table at the very bottom contains foods that are low in FODMAPs   Sometimes trying to eliminate the FODMAP's from your diet is  difficult or tricky, if you are stuggling with trying to do the elimination diet you can try an enzyme.  There is a food enzymes that you sprinkle in or on your food that helps break down the FODMAP. You can read more about the enzyme by going to this site: https://fodzyme.com/  Please take your proton pump inhibitor medication, pantoprazole  Please take this medication 30 minutes to 1 hour before meals- this makes it more effective.  Avoid spicy and acidic foods Avoid fatty foods Limit your intake of coffee, tea, alcohol, and carbonated drinks Work to maintain a healthy weight Keep the head of the bed elevated at least 3 inches with blocks or a wedge pillow if you are having any nighttime symptoms Stay upright for 2 hours after eating Avoid meals and snacks three to four hours before bedtime  _______________________________________________________  If your blood pressure at your visit was 140/90 or greater, please contact your primary care physician to follow up on this.  _______________________________________________________  If you are age 35 or older, your body mass index should be between 23-30. Your Body mass index is 31.97 kg/m. If this is out of the aforementioned range listed, please consider follow up with your Primary Care Provider.  If you are age 39 or younger, your body mass index should be between 19-25. Your Body mass index is 31.97 kg/m. If this is out of the aformentioned range listed, please consider follow up with your Primary Care Provider.   ________________________________________________________  The [facility] providers would like to encourage you to use [facility] to communicate with providers for non-urgent requests or questions.  Due to long hold times on the telephone, sending your provider a message by [facility] may be a faster and more efficient way to get a response.  Please allow 48 business hours for a response.  Please remember that this is for non-urgent  requests.  _______________________________________________________ It was a pleasure to see you today!  Thank you for trusting me with your gastrointestinal care!

## 2023-11-11 ENCOUNTER — Ambulatory Visit (HOSPITAL_COMMUNITY)
Admission: RE | Admit: 2023-11-11 | Discharge: 2023-11-11 | Disposition: A | Discharge status: Home / Self Care | Payer: BC Managed Care – PPO | Source: Ambulatory Visit | Attending: Physician Assistant | Admitting: Physician Assistant

## 2023-11-11 DIAGNOSIS — R1011 Right upper quadrant pain: Secondary | ICD-10-CM | POA: Diagnosis not present

## 2023-11-11 DIAGNOSIS — R197 Diarrhea, unspecified: Secondary | ICD-10-CM | POA: Insufficient documentation

## 2023-11-11 DIAGNOSIS — K219 Gastro-esophageal reflux disease without esophagitis: Secondary | ICD-10-CM | POA: Diagnosis not present

## 2023-11-11 DIAGNOSIS — K760 Fatty (change of) liver, not elsewhere classified: Secondary | ICD-10-CM | POA: Diagnosis not present

## 2023-11-11 DIAGNOSIS — K582 Mixed irritable bowel syndrome: Secondary | ICD-10-CM | POA: Insufficient documentation

## 2023-11-24 ENCOUNTER — Telehealth: Payer: Self-pay | Admitting: Family Medicine

## 2023-11-24 DIAGNOSIS — E781 Pure hyperglyceridemia: Secondary | ICD-10-CM

## 2023-11-24 DIAGNOSIS — Z131 Encounter for screening for diabetes mellitus: Secondary | ICD-10-CM | POA: Insufficient documentation

## 2023-11-24 DIAGNOSIS — Z79899 Other long term (current) drug therapy: Secondary | ICD-10-CM | POA: Insufficient documentation

## 2023-11-24 DIAGNOSIS — Z0000 Encounter for general adult medical examination without abnormal findings: Secondary | ICD-10-CM

## 2023-11-24 NOTE — Addendum Note (Signed)
 Addended by: Deri Fleet A on: 11/24/2023 08:37 PM   Modules accepted: Orders

## 2023-11-24 NOTE — Telephone Encounter (Signed)
-----   Message from Bernadene Brewer sent at 11/11/2023  9:14 AM EST ----- Regarding: Lab Fri 11/26/23 Hello,   Patient has a lab appointment on Fri 11/26/23. Can we get lab orders please.   Thanks

## 2023-11-26 ENCOUNTER — Other Ambulatory Visit (INDEPENDENT_AMBULATORY_CARE_PROVIDER_SITE_OTHER): Payer: BC Managed Care – PPO

## 2023-11-26 DIAGNOSIS — Z131 Encounter for screening for diabetes mellitus: Secondary | ICD-10-CM

## 2023-11-26 DIAGNOSIS — E781 Pure hyperglyceridemia: Secondary | ICD-10-CM | POA: Diagnosis not present

## 2023-11-26 DIAGNOSIS — Z79899 Other long term (current) drug therapy: Secondary | ICD-10-CM

## 2023-11-26 DIAGNOSIS — Z0000 Encounter for general adult medical examination without abnormal findings: Secondary | ICD-10-CM

## 2023-11-26 LAB — COMPREHENSIVE METABOLIC PANEL
ALT: 195 U/L — ABNORMAL HIGH (ref 0–53)
AST: 57 U/L — ABNORMAL HIGH (ref 0–37)
Albumin: 4.8 g/dL (ref 3.5–5.2)
Alkaline Phosphatase: 97 U/L (ref 39–117)
BUN: 10 mg/dL (ref 6–23)
CO2: 27 meq/L (ref 19–32)
Calcium: 9.4 mg/dL (ref 8.4–10.5)
Chloride: 104 meq/L (ref 96–112)
Creatinine, Ser: 1.11 mg/dL (ref 0.40–1.50)
GFR: 84.01 mL/min (ref >60.00)
Glucose, Bld: 93 mg/dL (ref 70–99)
Potassium: 4.6 meq/L (ref 3.5–5.1)
Sodium: 142 meq/L (ref 135–145)
Total Bilirubin: 0.7 mg/dL (ref 0.2–1.2)
Total Protein: 7.1 g/dL (ref 6.0–8.3)

## 2023-11-26 LAB — CBC WITH DIFFERENTIAL/PLATELET
Basophils Absolute: 0 10*3/uL (ref 0.0–0.1)
Basophils Relative: 0.4 % (ref 0.0–3.0)
Eosinophils Absolute: 0.4 10*3/uL (ref 0.0–0.7)
Eosinophils Relative: 3.7 % (ref 0.0–5.0)
HCT: 49.9 % (ref 39.0–52.0)
Hemoglobin: 16.8 g/dL (ref 13.0–17.0)
Lymphocytes Relative: 26.6 % (ref 12.0–46.0)
Lymphs Abs: 3 10*3/uL (ref 0.7–4.0)
MCHC: 33.7 g/dL (ref 30.0–36.0)
MCV: 88.9 fL (ref 78.0–100.0)
Monocytes Absolute: 0.8 10*3/uL (ref 0.1–1.0)
Monocytes Relative: 7.4 % (ref 3.0–12.0)
Neutro Abs: 7 10*3/uL (ref 1.4–7.7)
Neutrophils Relative %: 61.9 % (ref 43.0–77.0)
Platelets: 229 10*3/uL (ref 150.0–400.0)
RBC: 5.62 Mil/uL (ref 4.22–5.81)
RDW: 13.7 % (ref 11.5–15.5)
WBC: 11.4 10*3/uL — ABNORMAL HIGH (ref 4.0–10.5)

## 2023-11-26 LAB — VITAMIN B12: Vitamin B-12: 293 pg/mL (ref 211–911)

## 2023-11-26 LAB — LIPID PANEL
Cholesterol: 209 mg/dL — ABNORMAL HIGH (ref 0–200)
HDL: 49.9 mg/dL (ref >39.00)
LDL Cholesterol: 107 mg/dL — ABNORMAL HIGH (ref 0–99)
NonHDL: 159.15
Total CHOL/HDL Ratio: 4
Triglycerides: 262 mg/dL — ABNORMAL HIGH (ref 0.0–149.0)
VLDL: 52.4 mg/dL — ABNORMAL HIGH (ref 0.0–40.0)

## 2023-11-26 LAB — VITAMIN D 25 HYDROXY (VIT D DEFICIENCY, FRACTURES): VITD: 15.75 ng/mL — ABNORMAL LOW (ref 30.00–100.00)

## 2023-11-26 LAB — HEMOGLOBIN A1C: Hgb A1c MFr Bld: 5.1 % (ref 4.6–6.5)

## 2023-11-26 LAB — TSH: TSH: 3.02 u[IU]/mL (ref 0.35–5.50)

## 2023-12-03 ENCOUNTER — Ambulatory Visit (INDEPENDENT_AMBULATORY_CARE_PROVIDER_SITE_OTHER): Payer: BC Managed Care – PPO | Admitting: Family Medicine

## 2023-12-03 ENCOUNTER — Encounter: Payer: Self-pay | Admitting: Family Medicine

## 2023-12-03 VITALS — BP 128/76 | HR 96 | Temp 97.6°F | Ht 68.5 in | Wt 219.1 lb

## 2023-12-03 DIAGNOSIS — K760 Fatty (change of) liver, not elsewhere classified: Secondary | ICD-10-CM

## 2023-12-03 DIAGNOSIS — R7401 Elevation of levels of liver transaminase levels: Secondary | ICD-10-CM

## 2023-12-03 DIAGNOSIS — E669 Obesity, unspecified: Secondary | ICD-10-CM

## 2023-12-03 DIAGNOSIS — Z79899 Other long term (current) drug therapy: Secondary | ICD-10-CM

## 2023-12-03 DIAGNOSIS — E781 Pure hyperglyceridemia: Secondary | ICD-10-CM

## 2023-12-03 DIAGNOSIS — K219 Gastro-esophageal reflux disease without esophagitis: Secondary | ICD-10-CM

## 2023-12-03 DIAGNOSIS — Z0000 Encounter for general adult medical examination without abnormal findings: Secondary | ICD-10-CM | POA: Diagnosis not present

## 2023-12-03 DIAGNOSIS — Z131 Encounter for screening for diabetes mellitus: Secondary | ICD-10-CM

## 2023-12-03 DIAGNOSIS — R030 Elevated blood-pressure reading, without diagnosis of hypertension: Secondary | ICD-10-CM

## 2023-12-03 DIAGNOSIS — E559 Vitamin D deficiency, unspecified: Secondary | ICD-10-CM

## 2023-12-03 DIAGNOSIS — K582 Mixed irritable bowel syndrome: Secondary | ICD-10-CM | POA: Diagnosis not present

## 2023-12-03 DIAGNOSIS — R4589 Other symptoms and signs involving emotional state: Secondary | ICD-10-CM

## 2023-12-03 MED ORDER — FAMOTIDINE 20 MG PO TABS: 20.0000 mg | ORAL_TABLET | Freq: Two times a day (BID) | ORAL | 1 refills | Status: DC

## 2023-12-03 NOTE — Patient Instructions (Addendum)
Your vitamin D level is low  After your EGD and colonoscopy start vitamin D3 over the counter 4000 international units daily  Also take vitamin B12 250 mcg daily   For cholesterol Avoid red meat/ fried foods/ egg yolks/ fatty breakfast meats/ butter, cheese and high fat dairy/ and shellfish  Cut back on the fried and fatty foods / chips/ french fries/ donuts /fried chicken etc  Stay away from fast food    For weight loss and fatty liver  Try to get most of your carbohydrates from produce (with the exception of white potatoes) and whole grains Cook the fruits and veggies to cut down on GI side effects  Eat less bread/pasta/rice/snack foods/cereals/sweets and other items from the middle of the grocery store (processed carbs)  Cooked greens are a good things to try   Take care of yourself

## 2023-12-03 NOTE — Progress Notes (Signed)
 Subjective:    Patient ID: Donald Merritt, male    DOB: 1986/08/26, 38 y.o.   MRN: [id number]  HPI  Here for health maintenance exam and to review chronic medical problems   Wt Readings from Last 3 Encounters:  12/03/23 219 lb 2 oz (99.4 kg)  11/05/23 210 lb 4 oz (95.4 kg)  09/29/23 208 lb (94.3 kg)   32.83 kg/m  Vitals:   12/03/23 1455  BP: 128/76  Pulse: 96  Temp: 97.6 F (36.4 C)  SpO2: 97%    Immunization History  Administered Date(s) Administered   Influenza Split 09/04/2011   Influenza Whole 09/06/2007   Influenza, Seasonal, Injecte, Preservative Fre 07/12/2016, 07/16/2023   Influenza,inj,Quad PF,6+ Mos 07/22/2015, 07/21/2019, 10/09/2020   PFIZER Comirnaty(Gray Top)Covid-19 Tri-Sucrose Vaccine 12/06/2020, 12/27/2020   PPD Test 11/28/2020   Td 10/20/2003, 04/19/2020    Health Maintenance Due  Topic Date Due   Colonoscopy  07/30/2023   Seeing GI for ongoing symptoms Egd and colonoscopy planned next week  Still really struggling  Pain / bloating and nausea  Cannot eat much  Intermittent diarrhea and constipation    Colon cancer screening  Colonoscopy 07/2018 -5 y recall due to adenom polyps  Has this planned with EGD on Monday   No colon cancer or prostate cancer in family      Bone health  Falls-none Fractures-none  Supplements =none  Last vitamin D Lab Results  Component Value Date   VD25OH 15.75 (L) 11/26/2023    Exercise  Not able to do much due to GI problems  Not able to go to work    Mood    12/03/2023    2:59 PM 08/27/2023    2:00 PM 08/06/2023    2:05 PM 07/16/2023    2:56 PM 11/17/2022   11:01 AM  Depression screen PHQ 2/9  Decreased Interest 3 1 0 0 0  Down, Depressed, Hopeless 3 1 0 0 0  PHQ - 2 Score 6 2 0 0 0  Altered sleeping 3 1 3 3  0  Tired, decreased energy 3 1 3 3  0  Change in appetite 3 1 0 2 0  Feeling bad or failure about yourself  3 1 3 1  0  Trouble concentrating 3 1 0 0 0  Moving slowly or  fidgety/restless 0 1 0 0 0  Suicidal thoughts 0 0 0 0 0  PHQ-9 Score 21 8 9 9  0  Difficult doing work/chores Very difficult Somewhat difficult Not difficult at all Not difficult at all    Declines depression treatment  Is down because he does not feel good and cannot work  Declines counseling     Continues protonix for GERD Also pepcid 20 mg bid  Levsin for cramping/ IBS  Zofran for nausea  Robinul for cramping   Lab Results  Component Value Date   VITAMINB12 293 11/26/2023   History of hepatic steatosis Lab Results  Component Value Date   ALT 195 (H) 11/26/2023   AST 57 (H) 11/26/2023   ALKPHOS 97 11/26/2023   BILITOT 0.7 11/26/2023   He had [place] abd with GI  No gallstones Fatty liver noted  Considering HIDA if symptoms do not improve   May need hepatocellular work up    Lab Results  Component Value Date   NA 142 11/26/2023   K 4.6 11/26/2023   CO2 27 11/26/2023   GLUCOSE 93 11/26/2023   BUN 10 11/26/2023   CREATININE 1.11 11/26/2023   CALCIUM  9.4 11/26/2023   GFR 84.01 11/26/2023   EGFR 84 12/01/2021   GFRNONAA >60 07/03/2023    Diabetes screen Lab Results  Component Value Date   HGBA1C 5.1 11/26/2023    Hyperlipidemia with triglycerides  Lab Results  Component Value Date   CHOL 209 (H) 11/26/2023   CHOL 150 04/19/2020   CHOL 133 06/15/2014   Lab Results  Component Value Date   HDL 49.90 11/26/2023   HDL 33.60 (L) 04/19/2020   HDL 29.30 (L) 06/15/2014   Lab Results  Component Value Date   LDLCALC 107 (H) 11/26/2023   LDLCALC 81 04/19/2020   LDLCALC 65 06/15/2014   Lab Results  Component Value Date   TRIG 262.0 (H) 11/26/2023   TRIG 177.0 (H) 04/19/2020   TRIG 192.0 (H) 06/15/2014   Lab Results  Component Value Date   CHOLHDL 4 11/26/2023   CHOLHDL 4 04/19/2020   CHOLHDL 5 06/15/2014   No results found for: "LDLDIRECT"  Dislikes fatty food  Some fried food  Unable to eat a lot of veggies due to GI issues     Patient Active  Problem List   Diagnosis Date Noted   Vitamin D deficiency 12/03/2023   Diabetes mellitus screening 11/24/2023   Current use of proton pump inhibitor 11/24/2023   Periumbilical abdominal pain 09/29/2023   Obesity (BMI 30-39.9) 08/29/2023   Hepatic steatosis 08/08/2023   Elevated transaminase level 08/06/2023   Elevated blood pressure reading 08/06/2023   Other headache syndrome 09/09/2021   Routine general medical examination at a health care facility 04/19/2020   Chronic headaches 04/17/2019   IBS (irritable bowel syndrome) 01/04/2017   Former smoker 07/22/2015   Hypertriglyceridemia 03/13/2014   GERD (gastroesophageal reflux disease) 09/04/2011   History of kidney stones 03/12/2010   HYPERSOMNIA, PERSISTENT 03/15/2008   Allergic rhinitis 02/13/2008   Depressed mood 04/27/2007   Past Medical History:  Diagnosis Date   Allergy    Depression    Environmental allergies    GERD (gastroesophageal reflux disease)    Other and unspecified noninfectious gastroenteritis and colitis(558.9)    Other malaise and fatigue    Panic attack    Renal stones    Tobacco use disorder    Unspecified sleep apnea    Past Surgical History:  Procedure Laterality Date   neg hx     Social History   Tobacco Use   Smoking status: Former    Current packs/day: 0.00    Average packs/day: 1 pack/day for 15.0 years (15.0 ttl pk-yrs)    Types: Cigarettes    Start date: 09/04/2005    Quit date: 09/04/2020    Years since quitting: 3.2   Smokeless tobacco: Former    Types: Snuff    Quit date: 10/20/2011  Vaping Use   Vaping status: Former  Substance Use Topics   Alcohol use: Not Currently    Alcohol/week: 0.0 standard drinks of alcohol   Drug use: No   Family History  Problem Relation Age of Onset   Asthma Mother    Hypertension Father    Heart disease Paternal Grandmother    Diabetes Paternal Grandmother    Lactose intolerance Daughter    Bone cancer Paternal Grandfather    Colon cancer  Neg Hx    Esophageal cancer Neg Hx    Pancreatic cancer Neg Hx    Stomach cancer Neg Hx    Kidney disease Neg Hx    Liver disease Neg Hx    Allergies  Allergen  Reactions   Chantix [Varenicline] Shortness Of Breath   Betadine [Povidone Iodine] Rash   Current Outpatient Medications on File Prior to Visit  Medication Sig Dispense Refill   fluticasone (FLONASE) 50 MCG/ACT nasal spray Place 1 spray into both nostrils daily. (Patient taking differently: Place 1 spray into both nostrils as needed.) 16 g 0   gabapentin (NEURONTIN) 300 MG capsule Take 1 capsule (300 mg total) by mouth 2 (two) times daily. 180 capsule 2   glycopyrrolate (ROBINUL) 2 MG tablet TAKE 1 TABLET (2 MG TOTAL) BY MOUTH 2 (TWO) TIMES DAILY. AS NEEDED FOR ABDOMINAL PAIN 180 tablet 1   hyoscyamine (LEVSIN/SL) 0.125 MG SL tablet Place 2 tablets (0.25 mg total) under the tongue 4 (four) times daily as needed for cramping. 30 tablet 1   levocetirizine (XYZAL) 5 MG tablet Take 5 mg by mouth every evening.     loperamide (IMODIUM A-D) 2 MG tablet Take 2 mg by mouth as needed for diarrhea or loose stools.     metoprolol succinate (TOPROL-XL) 25 MG 24 hr tablet Take 1 tablet (25 mg total) by mouth daily. With food 90 tablet 3   ondansetron (ZOFRAN) 4 MG tablet Take 1 tablet (4 mg total) by mouth every 8 (eight) hours as needed for nausea or vomiting. Caution of sedation 20 tablet 0   pantoprazole (PROTONIX) 40 MG tablet Take 1 tablet (40 mg total) by mouth 2 (two) times daily. 180 tablet 3   PEPPERMINT OIL PO Take 1 Dose by mouth as needed.     rizatriptan (MAXALT-MLT) 10 MG disintegrating tablet Take 1 tablet (10 mg total) by mouth as needed for migraine. May repeat in 2 hours if needed 10 tablet 3   No current facility-administered medications on file prior to visit.    Review of Systems  Constitutional:  Negative for activity change, appetite change, fatigue, fever and unexpected weight change.  HENT:  Negative for congestion,  rhinorrhea, sore throat and trouble swallowing.   Eyes:  Negative for pain, redness, itching and visual disturbance.  Respiratory:  Negative for cough, chest tightness, shortness of breath and wheezing.   Cardiovascular:  Negative for chest pain and palpitations.  Gastrointestinal:  Positive for abdominal distention, abdominal pain, constipation, diarrhea and nausea. Negative for anal bleeding, blood in stool, rectal pain and vomiting.  Endocrine: Negative for cold intolerance, heat intolerance, polydipsia and polyuria.  Genitourinary:  Negative for difficulty urinating, dysuria, frequency and urgency.  Musculoskeletal:  Negative for arthralgias, joint swelling and myalgias.  Skin:  Negative for pallor and rash.  Neurological:  Negative for dizziness, tremors, weakness, numbness and headaches.  Hematological:  Negative for adenopathy. Does not bruise/bleed easily.  Psychiatric/Behavioral:  Positive for dysphoric mood. Negative for decreased concentration and suicidal ideas. The patient is not nervous/anxious.        Objective:   Physical Exam Constitutional:      General: He is not in acute distress.    Appearance: Normal appearance. He is well-developed. He is obese. He is not ill-appearing or diaphoretic.  HENT:     Head: Normocephalic and atraumatic.     Right Ear: Tympanic membrane, ear canal and external ear normal.     Left Ear: Tympanic membrane, ear canal and external ear normal.     Nose: Nose normal. No congestion.     Mouth/Throat:     Mouth: Mucous membranes are moist.     Pharynx: Oropharynx is clear. No posterior oropharyngeal erythema.  Eyes:  General: No scleral icterus.       Right eye: No discharge.        Left eye: No discharge.     Conjunctiva/sclera: Conjunctivae normal.     Pupils: Pupils are equal, round, and reactive to light.  Neck:     Thyroid: No thyromegaly.     Vascular: No carotid bruit or JVD.  Cardiovascular:     Rate and Rhythm: Normal rate  and regular rhythm.     Pulses: Normal pulses.     Heart sounds: Normal heart sounds.     No gallop.  Pulmonary:     Effort: Pulmonary effort is normal. No respiratory distress.     Breath sounds: Normal breath sounds. No wheezing or rales.     Comments: Good air exch Chest:     Chest wall: No tenderness.  Abdominal:     General: Bowel sounds are normal. There is no distension or abdominal bruit.     Palpations: Abdomen is soft. There is no mass.     Tenderness: There is no abdominal tenderness.     Hernia: No hernia is present.  Musculoskeletal:        General: No tenderness.     Cervical back: Normal range of motion and neck supple. No rigidity. No muscular tenderness.     Right lower leg: No edema.     Left lower leg: No edema.  Lymphadenopathy:     Cervical: No cervical adenopathy.  Skin:    General: Skin is warm and dry.     Coloration: Skin is not pale.     Findings: No erythema or rash.  Neurological:     Mental Status: He is alert.     Cranial Nerves: No cranial nerve deficit.     Motor: No abnormal muscle tone.     Coordination: Coordination normal.     Gait: Gait normal.     Deep Tendon Reflexes: Reflexes are normal and symmetric. Reflexes normal.  Psychiatric:        Attention and Perception: Attention normal.        Mood and Affect: Mood is depressed.        Cognition and Memory: Cognition and memory normal.     Comments: Candidly discusses symptoms and stressors             Assessment & Plan:   Problem List Items Addressed This Visit       Digestive   IBS (irritable bowel syndrome)   For colonoscopy next wk with GI  This in setting of abd pain and GERd are keeping him out of work unfortunately   Discussed cooking vegetables to see if better tolerated       Relevant Medications   famotidine (PEPCID) 20 MG tablet   Hepatic steatosis   Lab Results  Component Value Date   ALT 195 (H) 11/26/2023   AST 57 (H) 11/26/2023   ALKPHOS 97 11/26/2023    BILITOT 0.7 11/26/2023   Under care of GI  Working on weight loss  Considering fibro score and HIDA      GERD (gastroesophageal reflux disease)   Protonix 40 mg bid  Also pepcid 20 mg bid Watching diet For EGD next week      Relevant Medications   famotidine (PEPCID) 20 MG tablet     Other   Vitamin D deficiency   Last vitamin D Lab Results  Component Value Date   VD25OH 15.75 (L) 11/26/2023   On ppi Encouraged  to start 4000 international units daily of D3 for bone and overall health       Routine general medical examination at a health care facility - Primary   Reviewed health habits including diet and exercise and skin cancer prevention Reviewed appropriate screening tests for age  Also reviewed health mt list, fam hx and immunization status , as well as social and family history   See HPI Labs reviewed and ordered Health Maintenance  Topic Date Due   Colon Cancer Screening  07/30/2023   Hepatitis C Screening  04/19/2030*   HIV Screening  04/19/2030*   DTaP/Tdap/Td vaccine (3 - Tdap) 04/19/2030   Flu Shot  Completed   HPV Vaccine  Aged Out   COVID-19 Vaccine  Discontinued  *Topic was postponed. The date shown is not the original due date.   Colonoscopy planned next week  Discussed fall prevention, supplements and exercise for bone density  No prostate clinical changes  PHQ 8 due to GI problems  Pt declines treatment incl counseling        Obesity (BMI 30-39.9)   Bmi 32.8 in setting of severe GI symptoms limiting diet /food intol and limited exercise as well  Also fatty liver  Discussed how this problem influences overall health and the risks it imposes  Reviewed plan for weight loss with lower calorie diet (via better food choices (lower glycemic and portion control) along with exercise building up to or more than 30 minutes 5 days per week including some aerobic activity and strength training         Hypertriglyceridemia   Disc goals for lipids and  reasons to control them Rev last labs with pt Rev low sat fat diet in detail Trig 262 Normal glucose avg   Discussed less fat in diet        Elevated transaminase level   Lab Results  Component Value Date   ALT 195 (H) 11/26/2023   AST 57 (H) 11/26/2023   ALKPHOS 97 11/26/2023   BILITOT 0.7 11/26/2023   Has hepatic steatosis  Encouraged weight loss  Unde rcare of GI  Endoscopy planned soon  May consider HIDA depending on results / no gallstones on [place]       Elevated blood pressure reading   Normal while taking metoprolol xl 25 mg daily for headache prophylaxis        Diabetes mellitus screening   Lab Results  Component Value Date   HGBA1C 5.1 11/26/2023   Reassuring  Encouraged low glycemic diet       Depressed mood   In setting of ongoing GI problems preventing work and most other activity  Pt declines further eval or treatment incl counseling  Primarily wants to get GI problems improved      Current use of proton pump inhibitor   In midst of GI work up for GERD/ abd pain and stool changes  Continues protonix 40 mg bid  Lab Results  Component Value Date   VITAMINB12 293 11/26/2023   Last vitamin D Lab Results  Component Value Date   VD25OH 15.75 (L) 11/26/2023   Discussed supplementation

## 2023-12-04 NOTE — Assessment & Plan Note (Signed)
 Lab Results  Component Value Date   ALT 195 (H) 11/26/2023   AST 57 (H) 11/26/2023   ALKPHOS 97 11/26/2023   BILITOT 0.7 11/26/2023   Has hepatic steatosis  Encouraged weight loss  Unde rcare of GI  Endoscopy planned soon  May consider HIDA depending on results / no gallstones on [place]

## 2023-12-04 NOTE — Assessment & Plan Note (Signed)
 For colonoscopy next wk with GI  This in setting of abd pain and GERd are keeping him out of work unfortunately   Discussed cooking vegetables to see if better tolerated

## 2023-12-04 NOTE — Assessment & Plan Note (Signed)
 In setting of ongoing GI problems preventing work and most other activity  Pt declines further eval or treatment incl counseling  Primarily wants to get GI problems improved

## 2023-12-04 NOTE — Assessment & Plan Note (Signed)
 Normal while taking metoprolol xl 25 mg daily for headache prophylaxis

## 2023-12-04 NOTE — Assessment & Plan Note (Signed)
 Protonix 40 mg bid  Also pepcid 20 mg bid Watching diet For EGD next week

## 2023-12-04 NOTE — Assessment & Plan Note (Signed)
 Reviewed health habits including diet and exercise and skin cancer prevention Reviewed appropriate screening tests for age  Also reviewed health mt list, fam hx and immunization status , as well as social and family history   See HPI Labs reviewed and ordered Health Maintenance  Topic Date Due   Colon Cancer Screening  07/30/2023   Hepatitis C Screening  04/19/2030*   HIV Screening  04/19/2030*   DTaP/Tdap/Td vaccine (3 - Tdap) 04/19/2030   Flu Shot  Completed   HPV Vaccine  Aged Out   COVID-19 Vaccine  Discontinued  *Topic was postponed. The date shown is not the original due date.   Colonoscopy planned next week  Discussed fall prevention, supplements and exercise for bone density  No prostate clinical changes  PHQ 8 due to GI problems  Pt declines treatment incl counseling

## 2023-12-04 NOTE — Assessment & Plan Note (Signed)
 Disc goals for lipids and reasons to control them Rev last labs with pt Rev low sat fat diet in detail Trig 262 Normal glucose avg   Discussed less fat in diet

## 2023-12-04 NOTE — Assessment & Plan Note (Signed)
 Bmi 32.8 in setting of severe GI symptoms limiting diet /food intol and limited exercise as well  Also fatty liver  Discussed how this problem influences overall health and the risks it imposes  Reviewed plan for weight loss with lower calorie diet (via better food choices (lower glycemic and portion control) along with exercise building up to or more than 30 minutes 5 days per week including some aerobic activity and strength training

## 2023-12-04 NOTE — Assessment & Plan Note (Signed)
 Lab Results  Component Value Date   ALT 195 (H) 11/26/2023   AST 57 (H) 11/26/2023   ALKPHOS 97 11/26/2023   BILITOT 0.7 11/26/2023   Under care of GI  Working on weight loss  Considering fibro score and HIDA

## 2023-12-04 NOTE — Assessment & Plan Note (Signed)
 Last vitamin D Lab Results  Component Value Date   VD25OH 15.75 (L) 11/26/2023   On ppi Encouraged to start 4000 international units daily of D3 for bone and overall health

## 2023-12-04 NOTE — Assessment & Plan Note (Signed)
 Lab Results  Component Value Date   HGBA1C 5.1 11/26/2023   Reassuring  Encouraged low glycemic diet

## 2023-12-04 NOTE — Assessment & Plan Note (Signed)
 In midst of GI work up for GERD/ abd pain and stool changes  Continues protonix 40 mg bid  Lab Results  Component Value Date   VITAMINB12 293 11/26/2023   Last vitamin D Lab Results  Component Value Date   VD25OH 15.75 (L) 11/26/2023   Discussed supplementation

## 2023-12-05 NOTE — Progress Notes (Unsigned)
 [facility] History and Physical   Primary Care Physician:  Tower, Audrie Gallus, MD   Reason for Procedure:  Abdominal pain, diarrhea, nausea, GERD, history of sessile serrated polyp  Plan:    Diagnostic upper endoscopy and surveillance colonoscopy  HPI: Donald Merritt is a 38 y.o. male undergoing diagnostic upper endoscopy for evaluation and management of abdominal pain, diarrhea, nausea and GERD.  Patient had similar symptoms described in 2019 and upper endoscopy was unremarkable at that time.  Colonoscopy in 2019 revealed a 6 mm polyp found to be a sessile serrated polyp on pathology.  Terminal ileum was otherwise unremarkable. CT scan of the abdomen and pelvis 09/2023 was reassuring-notation of hepatic steatosis which is overall stable.  Past Medical History:  Diagnosis Date   Allergy    Depression    Environmental allergies    GERD (gastroesophageal reflux disease)    Other and unspecified noninfectious gastroenteritis and colitis(558.9)    Other malaise and fatigue    Panic attack    Renal stones    Tobacco use disorder    Unspecified sleep apnea     Past Surgical History:  Procedure Laterality Date   neg hx      Prior to Admission medications   Medication Sig Start Date End Date Taking? Authorizing Provider  famotidine (PEPCID) 20 MG tablet Take 1 tablet (20 mg total) by mouth 2 (two) times daily. 12/03/23   Tower, Audrie Gallus, MD  fluticasone (FLONASE) 50 MCG/ACT nasal spray Place 1 spray into both nostrils daily. Patient taking differently: Place 1 spray into both nostrils as needed. 11/27/17   Caccavale, Sophia, PA-C  gabapentin (NEURONTIN) 300 MG capsule Take 1 capsule (300 mg total) by mouth 2 (two) times daily. 08/27/23   Tower, Audrie Gallus, MD  glycopyrrolate (ROBINUL) 2 MG tablet TAKE 1 TABLET (2 MG TOTAL) BY MOUTH 2 (TWO) TIMES DAILY. AS NEEDED FOR ABDOMINAL PAIN 06/17/22   Tower, Audrie Gallus, MD  hyoscyamine (LEVSIN/SL) 0.125 MG SL tablet Place 2 tablets (0.25 mg  total) under the tongue 4 (four) times daily as needed for cramping. 07/16/23   Tower, Audrie Gallus, MD  levocetirizine (XYZAL) 5 MG tablet Take 5 mg by mouth every evening.    [provider]  loperamide (IMODIUM A-D) 2 MG tablet Take 2 mg by mouth as needed for diarrhea or loose stools.    [provider]  metoprolol succinate (TOPROL-XL) 25 MG 24 hr tablet Take 1 tablet (25 mg total) by mouth daily. With food 08/06/23   Tower, Audrie Gallus, MD  ondansetron (ZOFRAN) 4 MG tablet Take 1 tablet (4 mg total) by mouth every 8 (eight) hours as needed for nausea or vomiting. Caution of sedation 09/29/23   Tower, Audrie Gallus, MD  pantoprazole (PROTONIX) 40 MG tablet Take 1 tablet (40 mg total) by mouth 2 (two) times daily. 09/24/21   Tower, Audrie Gallus, MD  PEPPERMINT OIL PO Take 1 Dose by mouth as needed.    [provider]  rizatriptan (MAXALT-MLT) 10 MG disintegrating tablet Take 1 tablet (10 mg total) by mouth as needed for migraine. May repeat in 2 hours if needed 07/16/23   Tower, Audrie Gallus, MD    Current Outpatient Medications  Medication Sig Dispense Refill   fluticasone (FLONASE) 50 MCG/ACT nasal spray Place 1 spray into both nostrils daily. (Patient taking differently: Place 1 spray into both nostrils as needed.) 16 g 0   gabapentin (NEURONTIN) 300 MG capsule Take 1 capsule (300 mg total)  by mouth 2 (two) times daily. 180 capsule 2   levocetirizine (XYZAL) 5 MG tablet Take 5 mg by mouth every evening.     metoprolol succinate (TOPROL-XL) 25 MG 24 hr tablet Take 1 tablet (25 mg total) by mouth daily. With food 90 tablet 3   pantoprazole (PROTONIX) 40 MG tablet Take 1 tablet (40 mg total) by mouth 2 (two) times daily. 180 tablet 3   famotidine (PEPCID) 20 MG tablet Take 1 tablet (20 mg total) by mouth 2 (two) times daily. 180 tablet 1   glycopyrrolate (ROBINUL) 2 MG tablet TAKE 1 TABLET (2 MG TOTAL) BY MOUTH 2 (TWO) TIMES DAILY. AS NEEDED FOR ABDOMINAL PAIN 180 tablet 1   hyoscyamine  (LEVSIN/SL) 0.125 MG SL tablet Place 2 tablets (0.25 mg total) under the tongue 4 (four) times daily as needed for cramping. 30 tablet 1   loperamide (IMODIUM A-D) 2 MG tablet Take 2 mg by mouth as needed for diarrhea or loose stools.     ondansetron (ZOFRAN) 4 MG tablet Take 1 tablet (4 mg total) by mouth every 8 (eight) hours as needed for nausea or vomiting. Caution of sedation 20 tablet 0   PEPPERMINT OIL PO Take 1 Dose by mouth as needed.     rizatriptan (MAXALT-MLT) 10 MG disintegrating tablet Take 1 tablet (10 mg total) by mouth as needed for migraine. May repeat in 2 hours if needed 10 tablet 3   Current Facility-Administered Medications  Medication Dose Route Frequency Provider Last Rate Last Admin   0.9 %  sodium chloride infusion  500 mL Intravenous Once Shawndrea Rutkowski, Durene Romans, MD        Allergies as of 12/06/2023 - Review Complete 12/06/2023  Allergen Reaction Noted   Chantix [varenicline] Shortness Of Breath 02/21/2013   Betadine [povidone iodine] Rash 03/15/2013    Family History  Problem Relation Age of Onset   Asthma Mother    Hypertension Father    Heart disease Paternal Grandmother    Diabetes Paternal Grandmother    Lactose intolerance Daughter    Bone cancer Paternal Grandfather    Colon cancer Neg Hx    Esophageal cancer Neg Hx    Pancreatic cancer Neg Hx    Stomach cancer Neg Hx    Kidney disease Neg Hx    Liver disease Neg Hx     Social History   Socioeconomic History   Marital status: Married    Spouse name: Not on file   Number of children: 1   Years of education: Not on file   Highest education level: Not on file  Occupational History   Occupation: heating and air    Employer: [employer]  Tobacco Use   Smoking status: Former    Current packs/day: 0.00    Average packs/day: 1 pack/day for 15.0 years (15.0 ttl pk-yrs)    Types: Cigarettes    Start date: 09/04/2005    Quit date: 09/04/2020    Years since quitting: 3.2   Smokeless  tobacco: Former    Types: Snuff    Quit date: 10/20/2011  Vaping Use   Vaping status: Every Day   Substances: Nicotine  Substance and Sexual Activity   Alcohol use: Not Currently    Alcohol/week: 0.0 standard drinks of alcohol   Drug use: No   Sexual activity: Not on file  Other Topics Concern   Not on file  Social History Narrative   Not on file   Social Drivers of Health  Financial Resource Strain: Not on file  Food Insecurity: Not on file  Transportation Needs: Not on file  Physical Activity: Not on file  Stress: Not on file  Social Connections: Not on file  Intimate Partner Violence: Not on file    Review of Systems:  All other review of systems negative except as mentioned in the HPI.  Physical Exam: Vital signs BP (!) 138/90   Pulse 87   Temp 98.8 F (37.1 C)   Resp 17   Ht 5\' 8"  (1.727 m)   Wt 210 lb (95.3 kg)   SpO2 98%   BMI 31.93 kg/m   General:   Alert,  Well-developed, well-nourished, pleasant and cooperative in NAD Airway:  Mallampati 3 Lungs:  Clear throughout to auscultation.   Heart:  Regular rate and rhythm; no murmurs, clicks, rubs,  or gallops. Abdomen:  Soft, nontender and nondistended. Normal bowel sounds.   Neuro/Psych:  Normal mood and affect. A and O x 3  Maren Beach, MD [facility]

## 2023-12-06 ENCOUNTER — Encounter: Payer: Self-pay | Admitting: Pediatrics

## 2023-12-06 ENCOUNTER — Ambulatory Visit: Payer: BC Managed Care – PPO | Admitting: Pediatrics

## 2023-12-06 VITALS — BP 120/84 | HR 69 | Temp 98.8°F | Resp 18 | Ht 68.0 in | Wt 210.0 lb

## 2023-12-06 DIAGNOSIS — R197 Diarrhea, unspecified: Secondary | ICD-10-CM | POA: Diagnosis not present

## 2023-12-06 DIAGNOSIS — K2980 Duodenitis without bleeding: Secondary | ICD-10-CM

## 2023-12-06 DIAGNOSIS — K621 Rectal polyp: Secondary | ICD-10-CM

## 2023-12-06 DIAGNOSIS — Z860101 Personal history of adenomatous and serrated colon polyps: Secondary | ICD-10-CM | POA: Diagnosis not present

## 2023-12-06 DIAGNOSIS — R112 Nausea with vomiting, unspecified: Secondary | ICD-10-CM

## 2023-12-06 DIAGNOSIS — K635 Polyp of colon: Secondary | ICD-10-CM | POA: Diagnosis not present

## 2023-12-06 DIAGNOSIS — K219 Gastro-esophageal reflux disease without esophagitis: Secondary | ICD-10-CM

## 2023-12-06 DIAGNOSIS — D123 Benign neoplasm of transverse colon: Secondary | ICD-10-CM | POA: Diagnosis not present

## 2023-12-06 DIAGNOSIS — K760 Fatty (change of) liver, not elsewhere classified: Secondary | ICD-10-CM

## 2023-12-06 DIAGNOSIS — Z1211 Encounter for screening for malignant neoplasm of colon: Secondary | ICD-10-CM

## 2023-12-06 DIAGNOSIS — K581 Irritable bowel syndrome with constipation: Secondary | ICD-10-CM | POA: Diagnosis not present

## 2023-12-06 DIAGNOSIS — K582 Mixed irritable bowel syndrome: Secondary | ICD-10-CM

## 2023-12-06 DIAGNOSIS — R1011 Right upper quadrant pain: Secondary | ICD-10-CM

## 2023-12-06 DIAGNOSIS — D128 Benign neoplasm of rectum: Secondary | ICD-10-CM | POA: Diagnosis not present

## 2023-12-06 MED ORDER — SODIUM CHLORIDE 0.9 % IV SOLN: 500.0000 mL | Freq: Once | INTRAVENOUS | Status: DC

## 2023-12-06 NOTE — Patient Instructions (Addendum)
 Await pathology results. Return to GI clinic in 2 months.  Please read over handouts provided Please continue your normal medications   YOU HAD AN ENDOSCOPIC PROCEDURE TODAY AT THE [facility]:   Refer to the procedure report that was given to you for any specific questions about what was found during the examination.  If the procedure report does not answer your questions, please call your gastroenterologist to clarify.  If you requested that your care partner not be given the details of your procedure findings, then the procedure report has been included in a sealed envelope for you to review at your convenience later.  YOU SHOULD EXPECT: Some feelings of bloating in the abdomen. Passage of more gas than usual.  Walking can help get rid of the air that was put into your GI tract during the procedure and reduce the bloating. If you had a lower endoscopy (such as a colonoscopy or flexible sigmoidoscopy) you may notice spotting of blood in your stool or on the toilet paper. If you underwent a bowel prep for your procedure, you may not have a normal bowel movement for a few days.  Please Note:  You might notice some irritation and congestion in your nose or some drainage.  This is from the oxygen used during your procedure.  There is no need for concern and it should clear up in a day or so.  SYMPTOMS TO REPORT IMMEDIATELY:  Following lower endoscopy (colonoscopy or flexible sigmoidoscopy):  Excessive amounts of blood in the stool  Significant tenderness or worsening of abdominal pains  Swelling of the abdomen that is new, acute  Fever of 100F or higher  Following upper endoscopy (EGD)  Vomiting of blood or coffee ground material  New chest pain or pain under the shoulder blades  Painful or persistently difficult swallowing  New shortness of breath  Fever of 100F or higher  Black, tarry-looking stools  For urgent or emergent issues, a gastroenterologist can be reached at any  hour by calling [phone]. Do not use MyChart messaging for urgent concerns.    DIET:  We do recommend a small meal at first, but then you may proceed to your regular diet.  Drink plenty of fluids but you should avoid alcoholic beverages for 24 hours.  ACTIVITY:  You should plan to take it easy for the rest of today and you should NOT DRIVE or use heavy machinery until tomorrow (because of the sedation medicines used during the test).    FOLLOW UP: Our staff will call the number listed on your records the next business day following your procedure.  We will call around 7:15- 8:00 am to check on you and address any questions or concerns that you may have regarding the information given to you following your procedure. If we do not reach you, we will leave a message.     If any biopsies were taken you will be contacted by phone or by letter within the next 1-3 weeks.  Please call us at [phone] if you have not heard about the biopsies in 3 weeks.    SIGNATURES/CONFIDENTIALITY: You and/or your care partner have signed paperwork which will be entered into your electronic medical record.  These signatures attest to the fact that that the information above on your After Visit Summary has been reviewed and is understood.  Full responsibility of the confidentiality of this discharge information lies with you and/or your care-partner.

## 2023-12-06 NOTE — Op Note (Signed)
 [facility] Patient Name: Donald Merritt Procedure Date: 12/06/2023 1:57 PM MRN: [id number] Endoscopist: Maren Beach , MD, [id number] Age: 38 Referring MD:  Date of Birth: 1986-09-30 Gender: Male Account #: [id number] Procedure:                Upper GI endoscopy Indications:              Abdominal pain in the right upper quadrant,                            Follow-up of gastro-esophageal reflux disease,                            Diarrhea, Nausea Medicines:                Monitored Anesthesia Care Procedure:                Pre-Anesthesia Assessment:                           - Prior to the procedure, a History and Physical                            was performed, and patient medications and                            allergies were reviewed. The patient's tolerance of                            previous anesthesia was also reviewed. The risks                            and benefits of the procedure and the sedation                            options and risks were discussed with the patient.                            All questions were answered, and informed consent                            was obtained. Prior Anticoagulants: The patient has                            taken no anticoagulant or antiplatelet agents. ASA                            Grade Assessment: II - A patient with mild systemic                            disease. After reviewing the risks and benefits,                            the patient was deemed in satisfactory condition to  undergo the procedure.                           After obtaining informed consent, the endoscope was                            passed under direct vision. Throughout the                            procedure, the patient's blood pressure, pulse, and                            oxygen saturations were monitored continuously. The                            GIF [id number] #[id number] was introduced through the                             mouth, and advanced to the second part of duodenum.                            The upper GI endoscopy was accomplished without                            difficulty. The patient tolerated the procedure                            well. Scope In: Scope Out: Findings:                 The examined esophagus was normal.                           The gastric body, gastric antrum, cardia (on                            retroflexion) and gastric fundus (on retroflexion)                            were normal. Biopsies were taken with a cold                            forceps for Helicobacter pylori testing.                           The duodenal bulb and second portion of the                            duodenum were normal. Biopsies for histology were                            taken with a cold forceps for evaluation of celiac                            disease. Complications:  No immediate complications. Estimated blood loss:                            Minimal. Estimated Blood Loss:     Estimated blood loss was minimal. Impression:               - Normal esophagus.                           - Normal gastric body, antrum, cardia and gastric                            fundus. Biopsied.                           - Normal duodenal bulb and second portion of the                            duodenum. Biopsied. Recommendation:           - Await pathology results.                           - Perform a colonoscopy today.                           - The findings and recommendations were discussed                            with the patient's family.                           - Return to GI clinic in 2 months. Maren Beach, MD 12/06/2023 2:35:03 PM This report has been signed electronically.

## 2023-12-06 NOTE — Progress Notes (Signed)
 Report to PACU, RN, vss, BBS= Clear.

## 2023-12-06 NOTE — Progress Notes (Signed)
 Pt's states no medical or surgical changes since previsit or office visit.

## 2023-12-06 NOTE — Op Note (Signed)
 [name] Procedure Date: 12/06/2023 1:56 PM MRN: [id number] Endoscopist: Maren Beach , MD, [id number] Age: 38 Referring MD:  Date of Birth: [birth year] Gender: Male Account #: [id number] Procedure:                Colonoscopy Indications:              High risk colon cancer surveillance: Personal                            history of sessile serrated colon polyp (less than                            10 mm in size) with no dysplasia, Last colonoscopy:                            2019, Incidental abdominal pain noted, Incidental                            diarrhea noted Medicines:                Monitored Anesthesia Care Procedure:                Pre-Anesthesia Assessment:                           - Prior to the procedure, a History and Physical                            was performed, and patient medications and                            allergies were reviewed. The patient's tolerance of                            previous anesthesia was also reviewed. The risks                            and benefits of the procedure and the sedation                            options and risks were discussed with the patient.                            All questions were answered, and informed consent                            was obtained. Prior Anticoagulants: The patient has                            taken no anticoagulant or antiplatelet agents. ASA                            Grade Assessment: II - A patient with mild systemic  disease. After reviewing the risks and benefits,                            the patient was deemed in satisfactory condition to                            undergo the procedure.                           After obtaining informed consent, the colonoscope                            was passed under direct vision. Throughout the                            procedure, the patient's blood pressure, pulse,  and                            oxygen saturations were monitored continuously. The                            CF [id number] #[id number] was introduced through the anus                            and advanced to the terminal ileum. The colonoscopy                            was performed without difficulty. The patient                            tolerated the procedure well. The quality of the                            bowel preparation was good. The terminal ileum,                            ileocecal valve, appendiceal orifice, and rectum                            were photographed. Scope In: 2:13:14 PM Scope Out: 2:30:11 PM Scope Withdrawal Time: 0 hours 14 minutes 15 seconds  Total Procedure Duration: 0 hours 16 minutes 57 seconds  Findings:                 The perianal and digital rectal examinations were                            normal. Pertinent negatives include normal                            sphincter tone and no palpable rectal lesions.                           A 7 mm polyp was found in the transverse colon. The  polyp was sessile. The polyp was removed with a                            cold snare. Resection and retrieval were complete.                           A 4 mm polyp was found in the rectum. The polyp was                            sessile. The polyp was removed with a cold biopsy                            forceps. Resection and retrieval were complete.                           Normal mucosa was found in the entire colon.                            Biopsies for histology were taken with a cold                            forceps for evaluation of microscopic colitis.                           The terminal ileum appeared normal. Biopsies were                            taken with a cold forceps for histology.                           Internal hemorrhoids were found during retroflexion. Complications:            No immediate complications.  Estimated blood loss:                            Minimal. Estimated Blood Loss:     Estimated blood loss was minimal. Impression:               - One 7 mm polyp in the transverse colon, removed                            with a cold snare. Resected and retrieved.                           - One 4 mm polyp in the rectum, removed with a cold                            biopsy forceps. Resected and retrieved.                           - Normal mucosa in the entire examined colon.  Biopsied.                           - The examined portion of the ileum was normal.                            Biopsied.                           - Internal hemorrhoids. Recommendation:           - Discharge patient to home (ambulatory).                           - Await pathology results.                           - Repeat colonoscopy for surveillance based on                            pathology results.                           - The findings and recommendations were discussed                            with the patient's family.                           - Return to GI clinic in 2 months.                           - Patient has a contact number available for                            emergencies. The signs and symptoms of potential                            delayed complications were discussed with the                            patient. Return to normal activities tomorrow.                            Written discharge instructions were provided to the                            patient. Maren Beach, MD 12/06/2023 2:39:58 PM This report has been signed electronically.

## 2023-12-07 ENCOUNTER — Telehealth: Payer: Self-pay | Admitting: *Deleted

## 2023-12-07 NOTE — Telephone Encounter (Signed)
 Attempted f/u phone call. No answer. Left message.

## 2023-12-10 LAB — SURGICAL PATHOLOGY

## 2023-12-14 ENCOUNTER — Encounter: Payer: Self-pay | Admitting: Pediatrics

## 2023-12-15 ENCOUNTER — Encounter: Payer: Self-pay | Admitting: Family Medicine

## 2023-12-15 NOTE — Telephone Encounter (Signed)
 Paperwork or a letter?  If paperwork he will have to bring in for a visit  Thanks

## 2023-12-20 ENCOUNTER — Encounter: Payer: Self-pay | Admitting: Family Medicine

## 2023-12-20 ENCOUNTER — Ambulatory Visit (INDEPENDENT_AMBULATORY_CARE_PROVIDER_SITE_OTHER): Payer: BC Managed Care – PPO | Admitting: Family Medicine

## 2023-12-20 VITALS — BP 116/78 | HR 79 | Temp 98.0°F | Ht 68.0 in | Wt 222.4 lb

## 2023-12-20 DIAGNOSIS — G8929 Other chronic pain: Secondary | ICD-10-CM

## 2023-12-20 DIAGNOSIS — R519 Headache, unspecified: Secondary | ICD-10-CM | POA: Diagnosis not present

## 2023-12-20 DIAGNOSIS — K582 Mixed irritable bowel syndrome: Secondary | ICD-10-CM

## 2023-12-20 DIAGNOSIS — R1033 Periumbilical pain: Secondary | ICD-10-CM | POA: Diagnosis not present

## 2023-12-20 DIAGNOSIS — E669 Obesity, unspecified: Secondary | ICD-10-CM

## 2023-12-20 DIAGNOSIS — K219 Gastro-esophageal reflux disease without esophagitis: Secondary | ICD-10-CM | POA: Diagnosis not present

## 2023-12-20 NOTE — Patient Instructions (Signed)
 I did your FMLA and accommodation paperwork   Good luck getting back to work  Take care of yourself    Avoid triggers that flare your IBS

## 2023-12-20 NOTE — Assessment & Plan Note (Signed)
 Reviewed GI note and EGD Continues ppi and H2 blocker  Feels ready to return to work

## 2023-12-20 NOTE — Assessment & Plan Note (Signed)
 Pt is overall doing much better with metoprolol xl 25 mg daily / better blood pressure and gabapentin   Although headaches can be triggered by loud noise he feels ready to return to work w/o accommodation for this problem

## 2023-12-20 NOTE — Assessment & Plan Note (Signed)
 This is ongoing with abd pain and intermittent constipation and diarrhea  Reviewed most recent GI note and colonoscopy - reassuring  Has robinul or hyoscamine to use prn  Occational immodium for diarrhea   Feels ready to return to work with an accommodation (bathroom visits when needed and 10 minutes instead of 5)  Paperwork filled out today  Encouraged to avoid triggering foods and beverages

## 2023-12-20 NOTE — Assessment & Plan Note (Signed)
 Ongoing  Reviewed recent GI note and reassuring EGD   Continues protonix 40 mg bid and pepcid 20 mg bid  Encouraged to avoid triggers/ diet wise and work on weight loss

## 2023-12-20 NOTE — Assessment & Plan Note (Signed)
 Encouraged weight loss for fatty liver   Discussed how this problem influences overall health and the risks it imposes  Reviewed plan for weight loss with lower calorie diet (via better food choices (lower glycemic and portion control) along with exercise building up to or more than 30 minutes 5 days per week including some aerobic activity and strength training

## 2023-12-20 NOTE — Progress Notes (Signed)
 Subjective:    Patient ID: Donald Merritt, male    DOB: 1986/08/09, 38 y.o.   MRN: [id number]  HPI  Wt Readings from Last 3 Encounters:  12/20/23 222 lb 6 oz (100.9 kg)  12/06/23 210 lb (95.3 kg)  12/03/23 219 lb 2 oz (99.4 kg)   33.81 kg/m  Vitals:   12/20/23 0917  BP: 116/78  Pulse: 79  Temp: 98 F (36.7 C)  SpO2: 97%   Pt presents for FMLA paperwork   Has had leave for migraines and GI problems   Wants to return to work today   Feeling fair  Still some abd pain - feels like he will have to work through it     IBS (back and forth diarrhea and constipation)  GERD- recent EGD/ neg H pylori , on protonix 40 mg bid  Mild inflam changes in small bowel but no evid of celiac  Bx of colon-no inflam bd   Had polyps - 7 year recall   GI voiced concerns re: depression in light of missed work and flat affect    Elevated liver tests  Lab Results  Component Value Date   ALT 195 (H) 11/26/2023   AST 57 (H) 11/26/2023   ALKPHOS 97 11/26/2023   BILITOT 0.7 11/26/2023   Fatty liver on CT   Headaches are better with metoprolol   Has appointment in April    Patient Active Problem List   Diagnosis Date Noted   Vitamin D deficiency 12/03/2023   Diabetes mellitus screening 11/24/2023   Current use of proton pump inhibitor 11/24/2023   Periumbilical abdominal pain 09/29/2023   Obesity (BMI 30-39.9) 08/29/2023   Hepatic steatosis 08/08/2023   Elevated transaminase level 08/06/2023   Elevated blood pressure reading 08/06/2023   Other headache syndrome 09/09/2021   Routine general medical examination at a health care facility 04/19/2020   Chronic headaches 04/17/2019   IBS (irritable bowel syndrome) 01/04/2017   Former smoker 07/22/2015   Hypertriglyceridemia 03/13/2014   GERD (gastroesophageal reflux disease) 09/04/2011   History of kidney stones 03/12/2010   HYPERSOMNIA, PERSISTENT 03/15/2008   Allergic rhinitis 02/13/2008   Depressed mood 04/27/2007   Past  Medical History:  Diagnosis Date   Allergy    Depression    Environmental allergies    GERD (gastroesophageal reflux disease)    Other and unspecified noninfectious gastroenteritis and colitis(558.9)    Other malaise and fatigue    Panic attack    Renal stones    Tobacco use disorder    Unspecified sleep apnea    Past Surgical History:  Procedure Laterality Date   neg hx     Social History   Tobacco Use   Smoking status: Former    Current packs/day: 0.00    Average packs/day: 1 pack/day for 15.0 years (15.0 ttl pk-yrs)    Types: Cigarettes    Start date: 09/04/2005    Quit date: 09/04/2020    Years since quitting: 3.2   Smokeless tobacco: Former    Types: Snuff    Quit date: 10/20/2011  Vaping Use   Vaping status: Every Day   Substances: Nicotine  Substance Use Topics   Alcohol use: Not Currently    Alcohol/week: 0.0 standard drinks of alcohol   Drug use: No   Family History  Problem Relation Age of Onset   Asthma Mother    Hypertension Father    Heart disease Paternal Grandmother    Diabetes Paternal Grandmother    Lactose  intolerance Daughter    Bone cancer Paternal Grandfather    Colon cancer Neg Hx    Esophageal cancer Neg Hx    Pancreatic cancer Neg Hx    Stomach cancer Neg Hx    Kidney disease Neg Hx    Liver disease Neg Hx    Allergies  Allergen Reactions   Chantix [Varenicline] Shortness Of Breath   Betadine [Povidone Iodine] Rash   Current Outpatient Medications on File Prior to Visit  Medication Sig Dispense Refill   famotidine (PEPCID) 20 MG tablet Take 1 tablet (20 mg total) by mouth 2 (two) times daily. 180 tablet 1   fluticasone (FLONASE) 50 MCG/ACT nasal spray Place 1 spray into both nostrils daily. (Patient taking differently: Place 1 spray into both nostrils as needed.) 16 g 0   gabapentin (NEURONTIN) 300 MG capsule Take 1 capsule (300 mg total) by mouth 2 (two) times daily. 180 capsule 2   glycopyrrolate (ROBINUL) 2 MG tablet TAKE 1  TABLET (2 MG TOTAL) BY MOUTH 2 (TWO) TIMES DAILY. AS NEEDED FOR ABDOMINAL PAIN 180 tablet 1   hyoscyamine (LEVSIN/SL) 0.125 MG SL tablet Place 2 tablets (0.25 mg total) under the tongue 4 (four) times daily as needed for cramping. 30 tablet 1   levocetirizine (XYZAL) 5 MG tablet Take 5 mg by mouth every evening.     loperamide (IMODIUM A-D) 2 MG tablet Take 2 mg by mouth as needed for diarrhea or loose stools.     metoprolol succinate (TOPROL-XL) 25 MG 24 hr tablet Take 1 tablet (25 mg total) by mouth daily. With food 90 tablet 3   ondansetron (ZOFRAN) 4 MG tablet Take 1 tablet (4 mg total) by mouth every 8 (eight) hours as needed for nausea or vomiting. Caution of sedation 20 tablet 0   pantoprazole (PROTONIX) 40 MG tablet Take 1 tablet (40 mg total) by mouth 2 (two) times daily. 180 tablet 3   PEPPERMINT OIL PO Take 1 Dose by mouth as needed.     rizatriptan (MAXALT-MLT) 10 MG disintegrating tablet Take 1 tablet (10 mg total) by mouth as needed for migraine. May repeat in 2 hours if needed 10 tablet 3   No current facility-administered medications on file prior to visit.    Review of Systems  Constitutional:  Positive for fatigue. Negative for activity change, appetite change, fever and unexpected weight change.  HENT:  Negative for congestion, rhinorrhea, sore throat and trouble swallowing.   Eyes:  Negative for pain, redness, itching and visual disturbance.  Respiratory:  Negative for cough, chest tightness, shortness of breath and wheezing.   Cardiovascular:  Negative for chest pain and palpitations.  Gastrointestinal:  Positive for abdominal distention, abdominal pain, constipation, diarrhea and nausea. Negative for anal bleeding, blood in stool, rectal pain and vomiting.  Endocrine: Negative for cold intolerance, heat intolerance, polydipsia and polyuria.  Genitourinary:  Negative for difficulty urinating, dysuria, frequency and urgency.  Musculoskeletal:  Negative for arthralgias, joint  swelling and myalgias.  Skin:  Negative for pallor and rash.  Neurological:  Positive for headaches. Negative for dizziness, tremors, weakness and numbness.       Headaches are much less often   Hematological:  Negative for adenopathy. Does not bruise/bleed easily.  Psychiatric/Behavioral:  Negative for decreased concentration and dysphoric mood. The patient is not nervous/anxious.        Pt thinks mood is ok        Objective:   Physical Exam Constitutional:  General: He is not in acute distress.    Appearance: Normal appearance. He is obese. He is not ill-appearing or diaphoretic.  Eyes:     General: No scleral icterus.       Right eye: No discharge.        Left eye: No discharge.     Extraocular Movements: Extraocular movements intact.     Pupils: Pupils are equal, round, and reactive to light.  Cardiovascular:     Rate and Rhythm: Normal rate and regular rhythm.  Musculoskeletal:     Cervical back: Neck supple.  Skin:    General: Skin is warm and dry.     Coloration: Skin is not jaundiced or pale.     Findings: No bruising.  Neurological:     Mental Status: He is alert.     Cranial Nerves: No cranial nerve deficit.     Motor: No weakness.     Coordination: Coordination normal.     Gait: Gait normal.  Psychiatric:        Mood and Affect: Mood normal.           Assessment & Plan:   Problem List Items Addressed This Visit       Digestive   IBS (irritable bowel syndrome) - Primary   This is ongoing with abd pain and intermittent constipation and diarrhea  Reviewed most recent GI note and colonoscopy - reassuring  Has robinul or hyoscamine to use prn  Occational immodium for diarrhea   Feels ready to return to work with an accommodation (bathroom visits when needed and 10 minutes instead of 5)  Paperwork filled out today  Encouraged to avoid triggering foods and beverages       GERD (gastroesophageal reflux disease)   Ongoing  Reviewed recent GI note  and reassuring EGD   Continues protonix 40 mg bid and pepcid 20 mg bid  Encouraged to avoid triggers/ diet wise and work on weight loss         Other   Periumbilical abdominal pain   Reviewed GI note and EGD Continues ppi and H2 blocker  Feels ready to return to work       Obesity (BMI 30-39.9)   Encouraged weight loss for fatty liver   Discussed how this problem influences overall health and the risks it imposes  Reviewed plan for weight loss with lower calorie diet (via better food choices (lower glycemic and portion control) along with exercise building up to or more than 30 minutes 5 days per week including some aerobic activity and strength training         Chronic headaches   Pt is overall doing much better with metoprolol xl 25 mg daily / better blood pressure and gabapentin   Although headaches can be triggered by loud noise he feels ready to return to work w/o accommodation for this problem

## 2024-01-11 ENCOUNTER — Ambulatory Visit: Admitting: Family Medicine

## 2024-01-11 ENCOUNTER — Encounter: Payer: Self-pay | Admitting: Family Medicine

## 2024-01-11 VITALS — BP 110/66 | HR 65 | Temp 97.8°F | Ht 68.0 in | Wt 211.0 lb

## 2024-01-11 DIAGNOSIS — J040 Acute laryngitis: Secondary | ICD-10-CM | POA: Insufficient documentation

## 2024-01-11 DIAGNOSIS — J029 Acute pharyngitis, unspecified: Secondary | ICD-10-CM

## 2024-01-11 LAB — POC INFLUENZA A&B (BINAX/QUICKVUE)
Influenza A, POC: NEGATIVE
Influenza B, POC: NEGATIVE

## 2024-01-11 LAB — POC COVID19 BINAXNOW: SARS Coronavirus 2 Ag: NEGATIVE

## 2024-01-11 LAB — POCT RAPID STREP A (OFFICE): Rapid Strep A Screen: NEGATIVE

## 2024-01-11 MED ORDER — GUAIFENESIN-CODEINE 100-10 MG/5ML PO SOLN: 5.0000 mL | Freq: Four times a day (QID) | ORAL | 0 refills | Status: DC | PRN

## 2024-01-11 MED ORDER — PREDNISONE 10 MG PO TABS: ORAL_TABLET | ORAL | 0 refills | Status: DC

## 2024-01-11 NOTE — Progress Notes (Unsigned)
 Patient ID: Donald Merritt, male    DOB: 18-Sep-1986, 38 y.o.   MRN: [id number]  This visit was conducted in person.  BP 110/66 (BP Location: Left Arm, Patient Position: Sitting, Cuff Size: Large)   Pulse 65   Temp 97.8 F (36.6 C) (Temporal)   Ht 5\' 8"  (1.727 m)   Wt 211 lb (95.7 kg)   SpO2 99%   BMI 32.08 kg/m    CC:  Chief Complaint  Patient presents with   Hoarse   Sore Throat   Chills    Subjective:   HPI: Donald Merritt is a 38 y.o. male presenting on 01/11/2024 for Hoarse, Sore Throat, and Chills   Date of onset:5 days ago  Initial symptoms included  hoarse voice Symptoms progressed to tired, cough dry  Mucus in throat,  no face pain, no ear pain.  No SOB,  occ chronic wheeze from  vaping   Sick contacts:  at work COVID testing:   none    He has tried to treat with  Nyquil, Dayquil, allergy meds, flonase     No history of chronic lung disease such as asthma or COPD.  Former smoker, vapes     Relevant past medical, surgical, family and social history reviewed and updated as indicated. Interim medical history since our last visit reviewed. Allergies and medications reviewed and updated. Outpatient Medications Prior to Visit  Medication Sig Dispense Refill   famotidine (PEPCID) 20 MG tablet Take 1 tablet (20 mg total) by mouth 2 (two) times daily. 180 tablet 1   fluticasone (FLONASE) 50 MCG/ACT nasal spray Place 1 spray into both nostrils daily as needed for allergies or rhinitis.     gabapentin (NEURONTIN) 300 MG capsule Take 1 capsule (300 mg total) by mouth 2 (two) times daily. 180 capsule 2   glycopyrrolate (ROBINUL) 2 MG tablet TAKE 1 TABLET (2 MG TOTAL) BY MOUTH 2 (TWO) TIMES DAILY. AS NEEDED FOR ABDOMINAL PAIN 180 tablet 1   hyoscyamine (LEVSIN/SL) 0.125 MG SL tablet Place 2 tablets (0.25 mg total) under the tongue 4 (four) times daily as needed for cramping. 30 tablet 1   levocetirizine (XYZAL) 5 MG tablet Take 5 mg by mouth every evening.      loperamide (IMODIUM A-D) 2 MG tablet Take 2 mg by mouth as needed for diarrhea or loose stools.     metoprolol succinate (TOPROL-XL) 25 MG 24 hr tablet Take 1 tablet (25 mg total) by mouth daily. With food 90 tablet 3   ondansetron (ZOFRAN) 4 MG tablet Take 1 tablet (4 mg total) by mouth every 8 (eight) hours as needed for nausea or vomiting. Caution of sedation 20 tablet 0   pantoprazole (PROTONIX) 40 MG tablet Take 1 tablet (40 mg total) by mouth 2 (two) times daily. 180 tablet 3   PEPPERMINT OIL PO Take 1 Dose by mouth as needed.     rizatriptan (MAXALT-MLT) 10 MG disintegrating tablet Take 1 tablet (10 mg total) by mouth as needed for migraine. May repeat in 2 hours if needed 10 tablet 3   fluticasone (FLONASE) 50 MCG/ACT nasal spray Place 1 spray into both nostrils daily. (Patient taking differently: Place 1 spray into both nostrils as needed.) 16 g 0   No facility-administered medications prior to visit.     Per HPI unless specifically indicated in ROS section below Review of Systems  Constitutional:  Positive for fatigue.  HENT:  Positive for sore throat and voice change. Negative for  trouble swallowing.   Respiratory:  Positive for cough and wheezing. Negative for shortness of breath.    Objective:  BP 110/66 (BP Location: Left Arm, Patient Position: Sitting, Cuff Size: Large)   Pulse 65   Temp 97.8 F (36.6 C) (Temporal)   Ht 5\' 8"  (1.727 m)   Wt 211 lb (95.7 kg)   SpO2 99%   BMI 32.08 kg/m   Wt Readings from Last 3 Encounters:  01/11/24 211 lb (95.7 kg)  12/20/23 222 lb 6 oz (100.9 kg)  12/06/23 210 lb (95.3 kg)      Physical Exam Constitutional:      General: He is not in acute distress.    Appearance: Normal appearance. He is well-developed. He is not ill-appearing or toxic-appearing.     Comments: Hoarse voice  HENT:     Head: Normocephalic and atraumatic.     Right Ear: Hearing, tympanic membrane, ear canal and external ear normal. No tenderness. No foreign body.  Tympanic membrane is not retracted or bulging.     Left Ear: Hearing, tympanic membrane, ear canal and external ear normal. No tenderness. No foreign body. Tympanic membrane is not retracted or bulging.     Nose: Nose normal. No mucosal edema or rhinorrhea.     Right Sinus: No maxillary sinus tenderness or frontal sinus tenderness.     Left Sinus: No maxillary sinus tenderness or frontal sinus tenderness.     Mouth/Throat:     Dentition: Normal dentition. No dental caries.     Pharynx: Uvula midline. No oropharyngeal exudate.     Tonsils: No tonsillar abscesses.  Eyes:     General: Lids are normal. Lids are everted, no foreign bodies appreciated.     Conjunctiva/sclera: Conjunctivae normal.     Pupils: Pupils are equal, round, and reactive to light.  Neck:     Thyroid: No thyroid mass or thyromegaly.     Vascular: No carotid bruit.     Trachea: Trachea and phonation normal.  Cardiovascular:     Rate and Rhythm: Normal rate and regular rhythm.     Pulses: Normal pulses.     Heart sounds: Normal heart sounds, S1 normal and S2 normal. No murmur heard.    No gallop.  Pulmonary:     Effort: Pulmonary effort is normal. No respiratory distress.     Breath sounds: Normal breath sounds. No wheezing, rhonchi or rales.  Abdominal:     General: Bowel sounds are normal.     Palpations: Abdomen is soft.     Tenderness: There is no abdominal tenderness. There is no guarding or rebound.     Hernia: No hernia is present.  Musculoskeletal:     Cervical back: Normal range of motion and neck supple.  Skin:    General: Skin is warm and dry.     Findings: No rash.  Neurological:     Mental Status: He is alert.     Deep Tendon Reflexes: Reflexes are normal and symmetric.  Psychiatric:        Speech: Speech normal.        Behavior: Behavior normal.        Judgment: Judgment normal.       Results for orders placed or performed in visit on 01/11/24  POCT rapid strep A   Collection Time: 01/11/24  11:28 AM  Result Value Ref Range   Rapid Strep A Screen Negative Negative  POC COVID-19   Collection Time: 01/11/24 11:37 AM  Result Value  Ref Range   SARS Coronavirus 2 Ag Negative Negative  POC Influenza A&B (Binax test)   Collection Time: 01/11/24 11:37 AM  Result Value Ref Range   Influenza A, POC Negative Negative   Influenza B, POC Negative Negative    Assessment and Plan  Sore throat -     POC COVID-19 BinaxNow -     POC Influenza A&B(BINAX/QUICKVUE) -     POCT rapid strep A  Acute laryngitis Assessment & Plan: Acute, most likely viral in origin.  Negative COVID, flu and strep throat testing in office today. Discussed symptomatic care.  Patient is unable to take NSAIDs given GI issues.  Will treat with low-dose prednisone taper to help with sore throat and cough. Can use codeine with guaifenesin for cough and sore throat at night that is keeping him up.  Return and ER precautions provided.   Other orders -     predniSONE; 3 tabs by mouth daily x 3 days, then 2 tabs by mouth daily x 2 days then 1 tab by mouth daily x 2 days  Dispense: 15 tablet; Refill: 0 -     guaiFENesin-Codeine; Take 5 mLs by mouth every 6 (six) hours as needed for cough.  Dispense: 100 mL; Refill: 0    No follow-ups on file.   Kerby Nora, MD

## 2024-01-11 NOTE — Patient Instructions (Signed)
 Complete prednisone taper.  Can use cough suppressant at night.  Push fluids and rest. Call if new fever late in illness or if symptoms are worsening as opposed to improving.  Go to the emergency room for severe shortness of breath.

## 2024-01-11 NOTE — Assessment & Plan Note (Addendum)
 Acute, most likely viral in origin.  Negative COVID, flu and strep throat testing in office today. Discussed symptomatic care.  Patient is unable to take NSAIDs given GI issues.  Will treat with low-dose prednisone taper to help with sore throat and cough. Can use codeine with guaifenesin for cough and sore throat at night that is keeping him up.  Return and ER precautions provided.

## 2024-02-14 NOTE — Progress Notes (Unsigned)
 [facility] Return Visit   Referring Provider Tower, Manley Seeds, MD [address] [place],  [place] [address]  Primary Care Provider Tower, Manley Seeds, MD  Patient Profile: Donald Merritt is a 38 y.o. male who returns to the [facility] for follow-up of the problem(s) noted below.  Problem List: GERD Nausea RUQ abdominal pain IBS-D History of colon polyps -sessile serrated polyps and tubular adenomas Hepatic steatosis, elevated hepatic transminases   History of Present Illness   Donald Merritt was last seen in the GI office 11/05/2023   Current GI Meds  Famotidine  20 mg p.o. twice daily Pantoprazole  40 mg p.o. twice daily Ondansetron  as needed Peppermint oil Loperamide Hyoscyamine    Interval History    Last colonoscopy: 12/06/2023 - Two polyps 4-7 mm (TA), o/w normal colon and TI Last endoscopy:  12/06/2023 - endoscopically normal  Last Abd CT/CTE/MRE: 09/2023 - no acute abnormality, fatty liver  GI Review of Symptoms Significant for {GIROS:50592}. Otherwise negative.  General Review of Systems  Review of systems is significant for the pertinent positives and negatives as listed per the HPI.  Full ROS is otherwise negative.  Past Medical History   Past Medical History:  Diagnosis Date   Allergy    Depression    Environmental allergies    GERD (gastroesophageal reflux disease)    Other and unspecified noninfectious gastroenteritis and colitis(558.9)    Other malaise and fatigue    Panic attack    Renal stones    Tobacco use disorder    Unspecified sleep apnea      Past Surgical History   Past Surgical History:  Procedure Laterality Date   neg hx       Allergies and Medications   Allergies  Allergen Reactions   Chantix [Varenicline] Shortness Of Breath   Betadine [Povidone Iodine] Rash    @MEDSTODAY @  Family History   Family History  Problem Relation Age of Onset   Asthma Mother    Hypertension Father     Heart disease Paternal Grandmother    Diabetes Paternal Grandmother    Lactose intolerance Daughter    Bone cancer Paternal Grandfather    Colon cancer Neg Hx    Esophageal cancer Neg Hx    Pancreatic cancer Neg Hx    Stomach cancer Neg Hx    Kidney disease Neg Hx    Liver disease Neg Hx    GI Specific Family History: {gifamhx:50061}   Social History   Social History   Tobacco Use   Smoking status: Former    Current packs/day: 0.00    Average packs/day: 1 pack/day for 15.0 years (15.0 ttl pk-yrs)    Types: Cigarettes    Start date: 09/04/2005    Quit date: 09/04/2020    Years since quitting: 3.4   Smokeless tobacco: Former    Types: Snuff    Quit date: 10/20/2011  Vaping Use   Vaping status: Every Day   Substances: Nicotine   Substance Use Topics   Alcohol use: Not Currently    Alcohol/week: 0.0 standard drinks of alcohol   Drug use: No   Donald Merritt reports that he quit smoking about 3 years ago. His smoking use included cigarettes. He started smoking about 18 years ago. He has a 15 pack-year smoking history. He quit smokeless tobacco use about 12 years ago.  His smokeless tobacco use included snuff. He reports that he does not currently use alcohol. He reports that he does not use drugs.  Vital  Signs and Physical Examination  There were no vitals filed for this visit. There is no height or weight on file to calculate BMI.    General: Well developed, well nourished, no acute distress Head: Normocephalic and atraumatic Eyes: Sclerae anicteric, EOMI Ears: Normal auditory acuity Mouth: No deformities or lesions noted Lungs: Clear throughout to auscultation Heart: Regular rate and rhythm; No murmurs, rubs or bruits Abdomen: Soft, non tender and non distended. No masses, hepatosplenomegaly or hernias noted. Normal Bowel sounds Rectal: Musculoskeletal: Symmetrical with no gross deformities  Pulses:  Normal pulses noted Extremities: No edema or deformities  noted Neurological: Alert oriented x 4, grossly nonfocal Psychological:  Alert and cooperative. Normal mood and affect   Review of Data  The following data was reviewed at the time of this encounter:  Laboratory Studies      Latest Ref Rng & Units 11/26/2023    7:46 AM 11/05/2023    3:49 PM 09/29/2023   10:21 AM  CBC  WBC 4.0 - 10.5 K/uL 11.4  10.3  11.5   Hemoglobin 13.0 - 17.0 g/dL 16.1  09.6  04.5   Hematocrit 39.0 - 52.0 % 49.9  49.1  51.2   Platelets 150.0 - 400.0 K/uL 229.0  257.0  242.0     Lab Results  Component Value Date   LIPASE 12.0 09/29/2023      Latest Ref Rng & Units 11/26/2023    7:46 AM 11/05/2023    3:49 PM 09/29/2023   10:21 AM  CMP  Glucose 70 - 99 mg/dL 93  89  409   BUN 6 - 23 mg/dL 10  10  16    Creatinine 0.40 - 1.50 mg/dL 8.11  9.14  7.82   Sodium 135 - 145 mEq/L 142  139  139   Potassium 3.5 - 5.1 mEq/L 4.6  4.1  4.4   Chloride 96 - 112 mEq/L 104  102  104   CO2 19 - 32 mEq/L 27  30  29    Calcium 8.4 - 10.5 mg/dL 9.4  9.8  9.1   Total Protein 6.0 - 8.3 g/dL 7.1  7.3  7.0   Total Bilirubin 0.2 - 1.2 mg/dL 0.7  0.6  0.4   Alkaline Phos 39 - 117 U/L 97  93  95   AST 0 - 37 U/L 57  27  22   ALT 0 - 53 U/L 195  75  43      Imaging Studies  RUQ US  11/11/2023 1. No cholelithiasis or sonographic evidence for acute cholecystitis. 2. Increased hepatic parenchymal echogenicity suggestive of steatosis.  CTAP 09/29/2023 1. No acute intra-abdominal or pelvic pathology. 2. Fatty liver.    GI Procedures and Studies  EGD/Colonoscopy 12/06/2023 EGD - endoscopically normal Colonoscopy - two polyps 4-7 mm (TA), o/w normal colon and TI   Clinical Impression  It is my clinical impression that Donald Merritt is a 38 y.o. male with;  GERD Nausea RUQ abdominal pain IBS-D History of colon polyps -sessile serrated polyps and tubular adenomas Hepatic steatosis, elevated hepatic transaminases  Plan  *** *** *** *** ***   Planned Follow Up No  follow-ups on file.  The patient or caregiver verbalized understanding of the material covered, with no barriers to understanding. All questions were answered. Patient or caregiver is agreeable with the plan outlined above.    It was a pleasure to see Donald Merritt.  If you have any questions or concerns regarding this evaluation, do not hesitate to contact  me.  Eugenia Hess, MD [facility]

## 2024-02-15 ENCOUNTER — Other Ambulatory Visit (INDEPENDENT_AMBULATORY_CARE_PROVIDER_SITE_OTHER)

## 2024-02-15 ENCOUNTER — Ambulatory Visit: Payer: BC Managed Care – PPO | Admitting: Pediatrics

## 2024-02-15 ENCOUNTER — Encounter: Payer: Self-pay | Admitting: Pediatrics

## 2024-02-15 VITALS — BP 120/62 | HR 67 | Ht 68.0 in | Wt 210.0 lb

## 2024-02-15 DIAGNOSIS — R109 Unspecified abdominal pain: Secondary | ICD-10-CM

## 2024-02-15 DIAGNOSIS — K219 Gastro-esophageal reflux disease without esophagitis: Secondary | ICD-10-CM | POA: Diagnosis not present

## 2024-02-15 DIAGNOSIS — K580 Irritable bowel syndrome with diarrhea: Secondary | ICD-10-CM | POA: Diagnosis not present

## 2024-02-15 DIAGNOSIS — R197 Diarrhea, unspecified: Secondary | ICD-10-CM

## 2024-02-15 DIAGNOSIS — Z860100 Personal history of colon polyps, unspecified: Secondary | ICD-10-CM

## 2024-02-15 DIAGNOSIS — R945 Abnormal results of liver function studies: Secondary | ICD-10-CM | POA: Diagnosis not present

## 2024-02-15 DIAGNOSIS — R7989 Other specified abnormal findings of blood chemistry: Secondary | ICD-10-CM

## 2024-02-15 DIAGNOSIS — R1011 Right upper quadrant pain: Secondary | ICD-10-CM

## 2024-02-15 DIAGNOSIS — R110 Nausea: Secondary | ICD-10-CM

## 2024-02-15 DIAGNOSIS — R7401 Elevation of levels of liver transaminase levels: Secondary | ICD-10-CM

## 2024-02-15 LAB — CK: Total CK: 63 U/L (ref 7–232)

## 2024-02-15 LAB — IBC + FERRITIN
Ferritin: 268.7 ng/mL (ref 22.0–322.0)
Iron: 144 ug/dL (ref 42–165)
Saturation Ratios: 39.1 % (ref 20.0–50.0)
TIBC: 368.2 ug/dL (ref 250.0–450.0)
Transferrin: 263 mg/dL (ref 212.0–360.0)

## 2024-02-15 LAB — TSH: TSH: 1.5 u[IU]/mL (ref 0.35–5.50)

## 2024-02-15 LAB — B12 AND FOLATE PANEL
Folate: 8.4 ng/mL (ref >5.9)
Vitamin B-12: 313 pg/mL (ref 211–911)

## 2024-02-15 LAB — GAMMA GT: GGT: 34 U/L (ref 7–51)

## 2024-02-15 MED ORDER — METRONIDAZOLE 250 MG PO TABS: 250.0000 mg | ORAL_TABLET | Freq: Three times a day (TID) | ORAL | 0 refills | Status: AC

## 2024-02-15 MED ORDER — ONDANSETRON 4 MG PO TBDP: 4.0000 mg | ORAL_TABLET | Freq: Three times a day (TID) | ORAL | 0 refills | Status: AC | PRN

## 2024-02-15 NOTE — Patient Instructions (Signed)
 Your provider has requested that you go to the basement level for lab work before leaving today. Press "B" on the elevator. The lab is located at the first door on the left as you exit the elevator.  Due to recent changes in healthcare laws, you may see the results of your imaging and laboratory studies on MyChart before your provider has had a chance to review them.  We understand that in some cases there may be results that are confusing or concerning to you. Not all laboratory results come back in the same time frame and the provider may be waiting for multiple results in order to interpret others.  Please give us  48 hours in order for your provider to thoroughly review all the results before contacting the office for clarification of your results.   You have been scheduled for a HIDA scan at [facility] (1st floor) on Wednesday, 02/23/24. Please arrive 15 minutes prior to your scheduled appointment at  7:84ON. Make certain not to have anything to eat or drink after midnight prior to your test. Should this appointment date or time not work well for you, please call radiology scheduling at [phone].  _____________________________________________________________________ hepatobiliary (HIDA) scan is an imaging procedure used to diagnose problems in the liver, gallbladder and bile ducts. In the HIDA scan, a radioactive chemical or tracer is injected into a vein in your arm. The tracer is handled by the liver like bile. Bile is a fluid produced and excreted by your liver that helps your digestive system break down fats in the foods you eat. Bile is stored in your gallbladder and the gallbladder releases the bile when you eat a meal. A special nuclear medicine scanner (gamma camera) tracks the flow of the tracer from your liver into your gallbladder and small intestine.  During your HIDA scan  You'll be asked to change into a hospital gown before your HIDA scan begins. Your health care team will  position you on a table, usually on your back. The radioactive tracer is then injected into a vein in your arm.The tracer travels through your bloodstream to your liver, where it's taken up by the bile-producing cells. The radioactive tracer travels with the bile from your liver into your gallbladder and through your bile ducts to your small intestine.You may feel some pressure while the radioactive tracer is injected into your vein. As you lie on the table, a special gamma camera is positioned over your abdomen taking pictures of the tracer as it moves through your body. The gamma camera takes pictures continually for about an hour. You'll need to keep still during the HIDA scan. This can become uncomfortable, but you may find that you can lessen the discomfort by taking deep breaths and thinking about other things. Tell your health care team if you're uncomfortable. The radiologist will watch on a computer the progress of the radioactive tracer through your body. The HIDA scan may be stopped when the radioactive tracer is seen in the gallbladder and enters your small intestine. This typically takes about an hour. In some cases extra imaging will be performed if original images aren't satisfactory, if morphine  is given to help visualize the gallbladder or if the medication CCK is given to look at the contraction of the gallbladder. This test typically takes 2 hours to complete. ________________________________________________________________________   We have sent the following medications to your pharmacy for you to pick up at your convenience: Ondansetron  8mg .  Take 1 tablet every 8 hours  as needed for nausea.  Flagyl 250mg .  Take 1 tablet three times per day for 14 days.  Follow up in 2 months.  Thank you for entrusting me with your care and for choosing [facility],  Dr. Eugenia Hess  _______________________________________________________  If your blood pressure at your visit was  140/90 or greater, please contact your primary care physician to follow up on this.  _______________________________________________________  If you are age 39 or older, your body mass index should be between 23-30. Your Body mass index is 31.93 kg/m. If this is out of the aforementioned range listed, please consider follow up with your Primary Care Provider.  If you are age 82 or younger, your body mass index should be between 19-25. Your Body mass index is 31.93 kg/m. If this is out of the aformentioned range listed, please consider follow up with your Primary Care Provider.   ________________________________________________________  The [facility] providers would like to encourage you to use MYCHART to communicate with providers for non-urgent requests or questions.  Due to long hold times on the telephone, sending your provider a message by [facility] may be a faster and more efficient way to get a response.  Please allow 48 business hours for a response.  Please remember that this is for non-urgent requests.  _______________________________________________________

## 2024-02-16 ENCOUNTER — Encounter: Payer: Self-pay | Admitting: Pediatrics

## 2024-02-16 LAB — CERULOPLASMIN: Ceruloplasmin: 21 mg/dL (ref 14–30)

## 2024-02-17 LAB — ANTI-SMOOTH MUSCLE ANTIBODY, IGG: Actin (Smooth Muscle) Antibody (IGG): <20 U (ref <20)

## 2024-02-19 LAB — ALPHA-1-ANTITRYPSIN: A-1 Antitrypsin, Ser: 138 mg/dL (ref 83–199)

## 2024-02-19 LAB — ALPHA-GAL PANEL
Allergen, Mutton, f88: 0.17 kU/L — ABNORMAL HIGH
Allergen, Pork, f26: 0.13 kU/L — ABNORMAL HIGH
Beef: 0.23 kU/L — ABNORMAL HIGH
GALACTOSE-ALPHA-1,3-GALACTOSE IGE*: 0.37 kU/L — ABNORMAL HIGH (ref <0.10)

## 2024-02-19 LAB — HEPATITIS B SURFACE ANTIGEN: Hepatitis B Surface Ag: NONREACTIVE

## 2024-02-19 LAB — HEPATITIS B CORE ANTIBODY, TOTAL: Hep B Core Total Ab: NONREACTIVE

## 2024-02-19 LAB — INTERPRETATION:

## 2024-02-19 LAB — HEPATITIS B SURFACE ANTIBODY,QUALITATIVE: Hep B S Ab: REACTIVE — AB

## 2024-02-19 LAB — IGG: IgG (Immunoglobin G), Serum: 717 mg/dL (ref 600–1640)

## 2024-02-19 LAB — ANA: Anti Nuclear Antibody (ANA): NEGATIVE

## 2024-02-19 LAB — HEPATITIS C ANTIBODY: Hepatitis C Ab: NONREACTIVE

## 2024-02-21 ENCOUNTER — Encounter: Payer: Self-pay | Admitting: Pediatrics

## 2024-02-23 ENCOUNTER — Ambulatory Visit (HOSPITAL_COMMUNITY)
Admission: RE | Admit: 2024-02-23 | Discharge: 2024-02-23 | Disposition: A | Discharge status: Home / Self Care | Source: Ambulatory Visit | Attending: Pediatrics | Admitting: Pediatrics

## 2024-02-23 ENCOUNTER — Telehealth: Payer: Self-pay | Admitting: Pediatrics

## 2024-02-23 ENCOUNTER — Encounter: Payer: Self-pay | Admitting: Pediatrics

## 2024-02-23 DIAGNOSIS — R197 Diarrhea, unspecified: Secondary | ICD-10-CM | POA: Insufficient documentation

## 2024-02-23 DIAGNOSIS — R109 Unspecified abdominal pain: Secondary | ICD-10-CM | POA: Diagnosis not present

## 2024-02-23 DIAGNOSIS — R7989 Other specified abnormal findings of blood chemistry: Secondary | ICD-10-CM | POA: Insufficient documentation

## 2024-02-23 MED ORDER — TECHNETIUM TC 99M MEBROFENIN IV KIT
5.3000 | PACK | Freq: Once | INTRAVENOUS | Status: AC | PRN
  Administered 2024-02-23: 5.3 via INTRAVENOUS

## 2024-02-23 NOTE — Telephone Encounter (Signed)
 PT just received results through mychart and would like to have a referral for a surgeon sent in to schedule. Please advise.

## 2024-02-23 NOTE — Telephone Encounter (Signed)
 Referral, records, demographics, and insurance information have been faxed to [facility] to discuss cholecystectomy. Patient notified via MyChart.

## 2024-02-28 ENCOUNTER — Ambulatory Visit: Payer: Self-pay | Admitting: Surgery

## 2024-02-28 DIAGNOSIS — K828 Other specified diseases of gallbladder: Secondary | ICD-10-CM | POA: Diagnosis not present

## 2024-02-28 NOTE — H&P (View-Only) (Signed)
 History of Present Illness: Donald Merritt is a 38 y.o. male who is seen today as an office consultation for evaluation of New Consultation   He reports he has been having GI issues since 2009 and has been diagnosed with IBS. He has chronic diarrhea. For the last few months, he has been having RUQ abdominal pain, which gets worse after eating. He also has bloating and nausea. He had an EGD and colonoscopy earlier this year, which were normal aside from benign colon polyps. He had a RUQ US  in January that did not show any cholelithiasis or signs of acute cholecystitis. 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 He was referred to discuss surgery.    He has not had any prior abdominal surgeries.     Review of Systems: A complete review of systems was obtained from the patient.  I have reviewed this information and discussed as appropriate with the patient.  See HPI as well for other ROS.     Medical History:  Past Medical History Past Medical History: Diagnosis Date  GERD (gastroesophageal reflux disease)          Problem List There is no problem list on file for this patient.      Past Surgical History History reviewed. No pertinent surgical history.      Allergies Allergies Allergen Reactions  Chantix [Varenicline] Hives       Medications Ordered Prior to Encounter Current Outpatient Medications on File Prior to Visit Medication Sig Dispense Refill  famotidine  (PEPCID ) 20 MG tablet Take 20 mg by mouth 2 (two) times daily      gabapentin  (NEURONTIN ) 300 MG capsule Take 300 mg by mouth 2 (two) times daily      levocetirizine (XYZAL) 5 MG tablet Take 5 mg by mouth every evening      metoprolol  SUCCinate (TOPROL -XL) 25 MG XL tablet Take 25 mg by mouth      pantoprazole  (PROTONIX ) 40 MG DR tablet Take 1 tablet by mouth 2 (two) times daily      rizatriptan  (MAXALT -MLT) 10 MG disintegrating tablet Take 10 mg by mouth as needed        No current  facility-administered medications on file prior to visit.       Family History Family History Problem Relation Age of Onset  High blood pressure (Hypertension) Mother    Diabetes Mother    High blood pressure (Hypertension) Father    Hyperlipidemia (Elevated cholesterol) Father    Coronary Artery Disease (Blocked arteries around heart) Father         Tobacco Use History Social History    Tobacco Use Smoking Status Never Smokeless Tobacco Never       Social History Social History    Socioeconomic History  Marital status: Married Tobacco Use  Smoking status: Never  Smokeless tobacco: Never Vaping Use  Vaping status: Every Day Substance and Sexual Activity  Alcohol use: Never  Drug use: Never  Sexual activity: Never    Social Drivers of Health    Housing Stability: Unknown (02/28/2024)   Housing Stability Vital Sign    Homeless in the Last Year: No      Objective:   Vitals:   02/28/24 1351 BP: 114/80 Pulse: 70 Temp: 37 C (98.6 F) SpO2: 99% Weight: 97.5 kg (215 lb) Height: 175.3 cm (5\' 9" ) PainSc: 10-Worst pain ever PainLoc: Abdomen   Body mass index is 31.75 kg/m.   Physical Exam Vitals reviewed.  Constitutional:      General: He is not in acute distress.    Appearance: Normal appearance.  HENT:     Head: Normocephalic and atraumatic.  Eyes:     General: No scleral icterus.    Conjunctiva/sclera: Conjunctivae normal.  Cardiovascular:     Rate and Rhythm: Normal rate and regular rhythm.     Heart sounds: No murmur heard. Pulmonary:     Effort: Pulmonary effort is normal. No respiratory distress.     Breath sounds: Normal breath sounds. No wheezing.  Abdominal:     General: There is no distension.     Palpations: Abdomen is soft.     Tenderness: There is no abdominal tenderness.     Comments: Soft, nondistended, no surgical scars.  Skin:    General: Skin is warm and dry.     Coloration: Skin is not jaundiced.  Neurological:      General: No focal deficit present.     Mental Status: He is alert and oriented to person, place, and time.              Assessment and Plan:   Assessment Diagnoses and all orders for this visit:   Biliary dyskinesia     38 yo male with multiple GI symptoms, including postprandial RUQ abdominal pain and bloating, which are consistent with biliary colic.  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  I feel that at least some of his symptoms are related to his gallbladder. Laparoscopic (possible robotic-assisted) cholecystectomy was recommended. The details of this procedure were discussed with the patient, including the risks of bleeding, infection, bile leak, and <0.5% risk of common bile duct injury. The patient expressed understanding and agrees to proceed with surgery. I counseled that his diarrhea may worsen after cholecystectomy, and that any other symptoms related to IBS will not improve after cholecystectomy. He expressed understanding.    Karleen Overall, MD [facility] Surgery General, Hepatobiliary and Pancreatic Surgery 02/28/24 2:21 PM

## 2024-02-28 NOTE — H&P (Signed)
 History of Present Illness: Donald Merritt is a 38 y.o. male who is seen today as an office consultation for evaluation of New Consultation   He reports he has been having GI issues since 2009 and has been diagnosed with IBS. He has chronic diarrhea. For the last few months, he has been having RUQ abdominal pain, which gets worse after eating. He also has bloating and nausea. He had an EGD and colonoscopy earlier this year, which were normal aside from benign colon polyps. He had a RUQ US  in January that did not show any cholelithiasis or signs of acute cholecystitis. 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 He was referred to discuss surgery.    He has not had any prior abdominal surgeries.     Review of Systems: A complete review of systems was obtained from the patient.  I have reviewed this information and discussed as appropriate with the patient.  See HPI as well for other ROS.     Medical History:  Past Medical History Past Medical History: Diagnosis Date  GERD (gastroesophageal reflux disease)          Problem List There is no problem list on file for this patient.      Past Surgical History History reviewed. No pertinent surgical history.      Allergies Allergies Allergen Reactions  Chantix [Varenicline] Hives       Medications Ordered Prior to Encounter Current Outpatient Medications on File Prior to Visit Medication Sig Dispense Refill  famotidine  (PEPCID ) 20 MG tablet Take 20 mg by mouth 2 (two) times daily      gabapentin  (NEURONTIN ) 300 MG capsule Take 300 mg by mouth 2 (two) times daily      levocetirizine (XYZAL) 5 MG tablet Take 5 mg by mouth every evening      metoprolol  SUCCinate (TOPROL -XL) 25 MG XL tablet Take 25 mg by mouth      pantoprazole  (PROTONIX ) 40 MG DR tablet Take 1 tablet by mouth 2 (two) times daily      rizatriptan  (MAXALT -MLT) 10 MG disintegrating tablet Take 10 mg by mouth as needed        No current  facility-administered medications on file prior to visit.       Family History Family History Problem Relation Age of Onset  High blood pressure (Hypertension) Mother    Diabetes Mother    High blood pressure (Hypertension) Father    Hyperlipidemia (Elevated cholesterol) Father    Coronary Artery Disease (Blocked arteries around heart) Father         Tobacco Use History Social History    Tobacco Use Smoking Status Never Smokeless Tobacco Never       Social History Social History    Socioeconomic History  Marital status: Married Tobacco Use  Smoking status: Never  Smokeless tobacco: Never Vaping Use  Vaping status: Every Day Substance and Sexual Activity  Alcohol use: Never  Drug use: Never  Sexual activity: Never    Social Drivers of Health    Housing Stability: Unknown (02/28/2024)   Housing Stability Vital Sign    Homeless in the Last Year: No      Objective:   Vitals:   02/28/24 1351 BP: 114/80 Pulse: 70 Temp: 37 C (98.6 F) SpO2: 99% Weight: 97.5 kg (215 lb) Height: 175.3 cm (5\' 9" ) PainSc: 10-Worst pain ever PainLoc: Abdomen   Body mass index is 31.75 kg/m.   Physical Exam Vitals reviewed.  Constitutional:      General: He is not in acute distress.    Appearance: Normal appearance.  HENT:     Head: Normocephalic and atraumatic.  Eyes:     General: No scleral icterus.    Conjunctiva/sclera: Conjunctivae normal.  Cardiovascular:     Rate and Rhythm: Normal rate and regular rhythm.     Heart sounds: No murmur heard. Pulmonary:     Effort: Pulmonary effort is normal. No respiratory distress.     Breath sounds: Normal breath sounds. No wheezing.  Abdominal:     General: There is no distension.     Palpations: Abdomen is soft.     Tenderness: There is no abdominal tenderness.     Comments: Soft, nondistended, no surgical scars.  Skin:    General: Skin is warm and dry.     Coloration: Skin is not jaundiced.  Neurological:      General: No focal deficit present.     Mental Status: He is alert and oriented to person, place, and time.              Assessment and Plan:   Assessment Diagnoses and all orders for this visit:   Biliary dyskinesia     38 yo male with multiple GI symptoms, including postprandial RUQ abdominal pain and bloating, which are consistent with biliary colic.  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  I feel that at least some of his symptoms are related to his gallbladder. Laparoscopic (possible robotic-assisted) cholecystectomy was recommended. The details of this procedure were discussed with the patient, including the risks of bleeding, infection, bile leak, and <0.5% risk of common bile duct injury. The patient expressed understanding and agrees to proceed with surgery. I counseled that his diarrhea may worsen after cholecystectomy, and that any other symptoms related to IBS will not improve after cholecystectomy. He expressed understanding.    Karleen Overall, MD [facility] Surgery General, Hepatobiliary and Pancreatic Surgery 02/28/24 2:21 PM

## 2024-03-01 NOTE — Progress Notes (Signed)
 Surgical Instructions   Your procedure is scheduled on Mar 07, 2024. Report to [facility] Main Entrance "A" at 11:45 A.M., then check in with the Admitting office. Any questions or running late day of surgery: call [phone]  Questions prior to your surgery date: call [phone], Monday-Friday, 8am-4pm. If you experience any cold or flu symptoms such as cough, fever, chills, shortness of breath, etc. between now and your scheduled surgery, please notify us  at the above number.     Remember:  Do not eat after midnight the night before your surgery   You may drink clear liquids until 10:45 the morning of your surgery.   Clear liquids allowed are: Water, Non-Citrus Juices (without pulp), Carbonated Beverages, Clear Tea (no milk, honey, etc.), Black Coffee Only (NO MILK, CREAM OR POWDERED CREAMER of any kind), and Gatorade.    Take these medicines the morning of surgery with A SIP OF WATER  gabapentin  (NEURONTIN )  famotidine  (PEPCID )  metoprolol  succinate (TOPROL -XL)  pantoprazole  (PROTONIX )    May take these medicines IF NEEDED: fluticasone  (FLONASE )  glycopyrrolate  (ROBINUL )  guaiFENesin -codeine  syrup  hyoscyamine  (LEVSIN/SL)  ondansetron  (ZOFRAN )   One week prior to surgery, STOP taking any Aspirin (unless otherwise instructed by your surgeon) Aleve, Naproxen, Ibuprofen , Motrin , Advil , Goody's, BC's, all herbal medications, fish oil, and non-prescription vitamins.                     Do NOT Smoke (Tobacco/Vaping) for 24 hours prior to your procedure.  If you use a CPAP at night, you may bring your mask/headgear for your overnight stay.   You will be asked to remove any contacts, glasses, piercing's, hearing aid's, dentures/partials prior to surgery. Please bring cases for these items if needed.    Patients discharged the day of surgery will not be allowed to drive home, and someone needs to stay with them for 24 hours.  SURGICAL WAITING ROOM VISITATION Patients may  have no more than 2 support people in the waiting area - these visitors may rotate.   Pre-op nurse will coordinate an appropriate time for 1 ADULT support person, who may not rotate, to accompany patient in pre-op.  Children under the age of 55 must have an adult with them who is not the patient and must remain in the main waiting area with an adult.  If the patient needs to stay at the hospital during part of their recovery, the visitor guidelines for inpatient rooms apply.  Please refer to the [facility] website for the visitor guidelines for any additional information.   If you received a COVID test during your pre-op visit  it is requested that you wear a mask when out in public, stay away from anyone that may not be feeling well and notify your surgeon if you develop symptoms. If you have been in contact with anyone that has tested positive in the last 10 days please notify you surgeon.      Pre-operative CHG Bathing Instructions   You can play a key role in reducing the risk of infection after surgery. Your skin needs to be as free of germs as possible. You can reduce the number of germs on your skin by washing with CHG (chlorhexidine gluconate) soap before surgery. CHG is an antiseptic soap that kills germs and continues to kill germs even after washing.   DO NOT use if you have an allergy to chlorhexidine/CHG or antibacterial soaps. If your skin becomes reddened or irritated, stop using the  CHG and notify one of our RNs at [phone].              TAKE A SHOWER THE NIGHT BEFORE SURGERY AND THE DAY OF SURGERY    Please keep in mind the following:  DO NOT shave, including legs and underarms, 48 hours prior to surgery.   You may shave your face before/day of surgery.  Place clean sheets on your bed the night before surgery Use a clean washcloth (not used since being washed) for each shower. DO NOT sleep with pet's night before surgery.  CHG Shower Instructions:  Wash your face  and private area with normal soap. If you choose to wash your hair, wash first with your normal shampoo.  After you use shampoo/soap, rinse your hair and body thoroughly to remove shampoo/soap residue.  Turn the water OFF and apply half the bottle of CHG soap to a CLEAN washcloth.  Apply CHG soap ONLY FROM YOUR NECK DOWN TO YOUR TOES (washing for 3-5 minutes)  DO NOT use CHG soap on face, private areas, open wounds, or sores.  Pay special attention to the area where your surgery is being performed.  If you are having back surgery, having someone wash your back for you may be helpful. Wait 2 minutes after CHG soap is applied, then you may rinse off the CHG soap.  Pat dry with a clean towel  Put on clean pajamas    Additional instructions for the day of surgery: DO NOT APPLY any lotions, deodorants, cologne, or perfumes.   Do not wear jewelry or makeup Do not wear nail polish, gel polish, artificial nails, or any other type of covering on natural nails (fingers and toes) Do not bring valuables to the hospital. [facility] is not responsible for valuables/personal belongings. Put on clean/comfortable clothes.  Please brush your teeth.  Ask your nurse before applying any prescription medications to the skin.

## 2024-03-02 ENCOUNTER — Other Ambulatory Visit: Payer: Self-pay

## 2024-03-02 ENCOUNTER — Encounter (HOSPITAL_COMMUNITY)
Admission: RE | Admit: 2024-03-02 | Discharge: 2024-03-02 | Disposition: A | Discharge status: Home / Self Care | Source: Ambulatory Visit | Attending: Surgery | Admitting: Surgery

## 2024-03-02 ENCOUNTER — Encounter (HOSPITAL_COMMUNITY): Payer: Self-pay

## 2024-03-02 VITALS — BP 137/101 | HR 100 | Temp 98.1°F | Resp 17 | Ht 69.0 in | Wt 210.0 lb

## 2024-03-02 DIAGNOSIS — Z01818 Encounter for other preprocedural examination: Secondary | ICD-10-CM | POA: Insufficient documentation

## 2024-03-02 HISTORY — DX: Essential (primary) hypertension: I10

## 2024-03-02 LAB — BASIC METABOLIC PANEL WITH GFR
Anion gap: 9 (ref 5–15)
BUN: 5 mg/dL — ABNORMAL LOW (ref 6–20)
CO2: 27 mmol/L (ref 22–32)
Calcium: 9.4 mg/dL (ref 8.9–10.3)
Chloride: 103 mmol/L (ref 98–111)
Creatinine, Ser: 1.02 mg/dL (ref 0.61–1.24)
GFR, Estimated: >60 mL/min (ref >60)
Glucose, Bld: 92 mg/dL (ref 70–99)
Potassium: 4.4 mmol/L (ref 3.5–5.1)
Sodium: 139 mmol/L (ref 135–145)

## 2024-03-02 LAB — CBC
HCT: 50 % (ref 39.0–52.0)
Hemoglobin: 17.4 g/dL — ABNORMAL HIGH (ref 13.0–17.0)
MCH: 29.6 pg (ref 26.0–34.0)
MCHC: 34.8 g/dL (ref 30.0–36.0)
MCV: 85 fL (ref 80.0–100.0)
Platelets: 229 10*3/uL (ref 150–400)
RBC: 5.88 MIL/uL — ABNORMAL HIGH (ref 4.22–5.81)
RDW: 12.2 % (ref 11.5–15.5)
WBC: 12.1 10*3/uL — ABNORMAL HIGH (ref 4.0–10.5)
nRBC: 0 % (ref 0.0–0.2)

## 2024-03-02 NOTE — Progress Notes (Signed)
 PCP - Mitchell Ana Tower,MD Cardiologist - denies  PPM/ICD - denies Device Orders -  Rep Notified -   Chest x-ray - na EKG - 03/02/24 Stress Test - denies ECHO - denies Cardiac Cath - denies  Sleep Study - denies CPAP -   Fasting Blood Sugar - na Checks Blood Sugar _____ times a day  Last dose of GLP1 agonist-  na GLP1 instructions:   Blood Thinner Instructions:na Aspirin Instructions:na  ERAS Protcol -clears until 0915 PRE-SURGERY Ensure or G2- no  COVID TEST- na   Anesthesia review: no  Patient denies shortness of breath, fever, cough and chest pain at PAT appointment   All instructions explained to the patient, with a verbal understanding of the material. Patient agrees to go over the instructions while at home for a better understanding. . The opportunity to ask questions was provided.

## 2024-03-02 NOTE — Progress Notes (Addendum)
 Surgical Instructions   Your procedure is scheduled on Mar 07, 2024. Report to [facility] Main Entrance "A" at 10:15 A.M., then check in with the Admitting office. Any questions or running late day of surgery: call [phone]  Questions prior to your surgery date: call [phone], Monday-Friday, 8am-4pm. If you experience any cold or flu symptoms such as cough, fever, chills, shortness of breath, etc. between now and your scheduled surgery, please notify us  at the above number.     Remember:  Do not eat after midnight the night before your surgery   You may drink clear liquids until 9:15 the morning of your surgery.   Clear liquids allowed are: Water, Non-Citrus Juices (without pulp), Carbonated Beverages, Clear Tea (no milk, honey, etc.), Black Coffee Only (NO MILK, CREAM OR POWDERED CREAMER of any kind), and Gatorade.    Take these medicines the morning of surgery with A SIP OF WATER  gabapentin  (NEURONTIN )  famotidine  (PEPCID )  metoprolol  succinate (TOPROL -XL)  pantoprazole  (PROTONIX )    May take these medicines IF NEEDED: fluticasone  (FLONASE )  glycopyrrolate  (ROBINUL )  hyoscyamine  (LEVSIN/SL)  ondansetron  (ZOFRAN )   One week prior to surgery, STOP taking any Aspirin (unless otherwise instructed by your surgeon) Aleve, Naproxen, Ibuprofen , Motrin , Advil , Goody's, BC's, all herbal medications, fish oil, and non-prescription vitamins.                     Do NOT Smoke (Tobacco/Vaping) for 24 hours prior to your procedure.  If you use a CPAP at night, you may bring your mask/headgear for your overnight stay.   You will be asked to remove any contacts, glasses, piercing's, hearing aid's, dentures/partials prior to surgery. Please bring cases for these items if needed.    Patients discharged the day of surgery will not be allowed to drive home, and someone needs to stay with them for 24 hours.  SURGICAL WAITING ROOM VISITATION Patients may have no more than 2 support  people in the waiting area - these visitors may rotate.   Pre-op nurse will coordinate an appropriate time for 1 ADULT support person, who may not rotate, to accompany patient in pre-op.  Children under the age of 50 must have an adult with them who is not the patient and must remain in the main waiting area with an adult.  If the patient needs to stay at the hospital during part of their recovery, the visitor guidelines for inpatient rooms apply.  Please refer to the [facility] website for the visitor guidelines for any additional information.   If you received a COVID test during your pre-op visit  it is requested that you wear a mask when out in public, stay away from anyone that may not be feeling well and notify your surgeon if you develop symptoms. If you have been in contact with anyone that has tested positive in the last 10 days please notify you surgeon.      Pre-operative CHG Bathing Instructions   You can play a key role in reducing the risk of infection after surgery. Your skin needs to be as free of germs as possible. You can reduce the number of germs on your skin by washing with CHG (chlorhexidine gluconate) soap before surgery. CHG is an antiseptic soap that kills germs and continues to kill germs even after washing.   DO NOT use if you have an allergy to chlorhexidine/CHG or antibacterial soaps. If your skin becomes reddened or irritated, stop using the CHG and notify  one of our RNs at [phone].              TAKE A SHOWER THE NIGHT BEFORE SURGERY AND THE DAY OF SURGERY    Please keep in mind the following:  DO NOT shave, including legs and underarms, 48 hours prior to surgery.   You may shave your face before/day of surgery.  Place clean sheets on your bed the night before surgery Use a clean washcloth (not used since being washed) for each shower. DO NOT sleep with pet's night before surgery.  CHG Shower Instructions:  Wash your face and private area with normal  soap. If you choose to wash your hair, wash first with your normal shampoo.  After you use shampoo/soap, rinse your hair and body thoroughly to remove shampoo/soap residue.  Turn the water OFF and apply half the bottle of CHG soap to a CLEAN washcloth.  Apply CHG soap ONLY FROM YOUR NECK DOWN TO YOUR TOES (washing for 3-5 minutes)  DO NOT use CHG soap on face, private areas, open wounds, or sores.  Pay special attention to the area where your surgery is being performed.  If you are having back surgery, having someone wash your back for you may be helpful. Wait 2 minutes after CHG soap is applied, then you may rinse off the CHG soap.  Pat dry with a clean towel  Put on clean pajamas    Additional instructions for the day of surgery: DO NOT APPLY any lotions, deodorants, cologne, or perfumes.   Do not wear jewelry or makeup Do not wear nail polish, gel polish, artificial nails, or any other type of covering on natural nails (fingers and toes) Do not bring valuables to the hospital. [facility] is not responsible for valuables/personal belongings. Put on clean/comfortable clothes.  Please brush your teeth.  Ask your nurse before applying any prescription medications to the skin.

## 2024-03-06 ENCOUNTER — Other Ambulatory Visit: Payer: Self-pay | Admitting: Family Medicine

## 2024-03-07 ENCOUNTER — Ambulatory Visit (HOSPITAL_COMMUNITY): Payer: Self-pay | Admitting: Anesthesiology

## 2024-03-07 ENCOUNTER — Encounter (HOSPITAL_COMMUNITY)
Admission: RE | Disposition: A | Discharge status: Home / Self Care | Payer: Self-pay | Source: Home / Self Care | Attending: Surgery

## 2024-03-07 ENCOUNTER — Other Ambulatory Visit: Payer: Self-pay

## 2024-03-07 ENCOUNTER — Ambulatory Visit (HOSPITAL_COMMUNITY)
Admission: RE | Admit: 2024-03-07 | Discharge: 2024-03-07 | Disposition: A | Discharge status: Home / Self Care | Payer: Self-pay | Attending: Surgery | Admitting: Surgery

## 2024-03-07 ENCOUNTER — Encounter (HOSPITAL_COMMUNITY): Payer: Self-pay | Admitting: Surgery

## 2024-03-07 DIAGNOSIS — G4730 Sleep apnea, unspecified: Secondary | ICD-10-CM | POA: Diagnosis not present

## 2024-03-07 DIAGNOSIS — R519 Headache, unspecified: Secondary | ICD-10-CM | POA: Diagnosis not present

## 2024-03-07 DIAGNOSIS — K219 Gastro-esophageal reflux disease without esophagitis: Secondary | ICD-10-CM | POA: Diagnosis not present

## 2024-03-07 DIAGNOSIS — Z79899 Other long term (current) drug therapy: Secondary | ICD-10-CM | POA: Insufficient documentation

## 2024-03-07 DIAGNOSIS — I10 Essential (primary) hypertension: Secondary | ICD-10-CM | POA: Insufficient documentation

## 2024-03-07 DIAGNOSIS — Z91199 Patient's noncompliance with other medical treatment and regimen due to unspecified reason: Secondary | ICD-10-CM | POA: Insufficient documentation

## 2024-03-07 DIAGNOSIS — K828 Other specified diseases of gallbladder: Secondary | ICD-10-CM | POA: Diagnosis not present

## 2024-03-07 DIAGNOSIS — K580 Irritable bowel syndrome with diarrhea: Secondary | ICD-10-CM | POA: Diagnosis not present

## 2024-03-07 SURGERY — CHOLECYSTECTOMY, ROBOT-ASSISTED, LAPAROSCOPIC
Anesthesia: General

## 2024-03-07 MED ORDER — BUPIVACAINE-EPINEPHRINE (PF) 0.25% -1:200000 IJ SOLN
INTRAMUSCULAR | Status: DC | PRN
  Administered 2024-03-07: 30 mL via PERINEURAL

## 2024-03-07 MED ORDER — INDOCYANINE GREEN 25 MG IV SOLR
1.2500 mg | Freq: Once | INTRAVENOUS | Status: AC
  Administered 2024-03-07: 1.25 mg via INTRAVENOUS
  Filled 2024-03-07: qty 10

## 2024-03-07 MED ORDER — EPHEDRINE SULFATE (PRESSORS) 50 MG/ML IJ SOLN
INTRAMUSCULAR | Status: DC | PRN
  Administered 2024-03-07 (×2): 5 mg via INTRAVENOUS

## 2024-03-07 MED ORDER — LACTATED RINGERS IV SOLN
INTRAVENOUS | Status: DC | PRN
Start: 2024-03-07 — End: 2024-03-07

## 2024-03-07 MED ORDER — AMISULPRIDE (ANTIEMETIC) 5 MG/2ML IV SOLN: 10.0000 mg | Freq: Once | INTRAVENOUS | Status: DC | PRN

## 2024-03-07 MED ORDER — OXYCODONE HCL 5 MG PO TABS
5.0000 mg | ORAL_TABLET | Freq: Once | ORAL | Status: AC | PRN
  Administered 2024-03-07: 5 mg via ORAL

## 2024-03-07 MED ORDER — PROPOFOL 10 MG/ML IV BOLUS
INTRAVENOUS | Status: DC | PRN
  Administered 2024-03-07: 200 mg via INTRAVENOUS
  Administered 2024-03-07: 50 mg via INTRAVENOUS

## 2024-03-07 MED ORDER — PROPOFOL 10 MG/ML IV BOLUS
INTRAVENOUS | Status: AC
  Filled 2024-03-07: qty 20

## 2024-03-07 MED ORDER — CHLORHEXIDINE GLUCONATE 0.12 % MT SOLN
15.0000 mL | Freq: Once | OROMUCOSAL | Status: AC
  Administered 2024-03-07: 15 mL via OROMUCOSAL
  Filled 2024-03-07: qty 15

## 2024-03-07 MED ORDER — SUGAMMADEX SODIUM 200 MG/2ML IV SOLN
INTRAVENOUS | Status: DC | PRN
  Administered 2024-03-07: 400 mg via INTRAVENOUS

## 2024-03-07 MED ORDER — ACETAMINOPHEN 500 MG PO TABS
1000.0000 mg | ORAL_TABLET | ORAL | Status: AC
Start: 2024-03-07 — End: 2024-03-07
  Administered 2024-03-07: 1000 mg via ORAL
  Filled 2024-03-07: qty 2

## 2024-03-07 MED ORDER — ONDANSETRON HCL 4 MG/2ML IJ SOLN
INTRAMUSCULAR | Status: AC
  Filled 2024-03-07: qty 2

## 2024-03-07 MED ORDER — 0.9 % SODIUM CHLORIDE (POUR BTL) OPTIME
TOPICAL | Status: DC | PRN
  Administered 2024-03-07: 1000 mL

## 2024-03-07 MED ORDER — MIDAZOLAM HCL 2 MG/2ML IJ SOLN
INTRAMUSCULAR | Status: DC | PRN
  Administered 2024-03-07: 2 mg via INTRAVENOUS

## 2024-03-07 MED ORDER — FENTANYL CITRATE (PF) 100 MCG/2ML IJ SOLN
25.0000 ug | INTRAMUSCULAR | Status: DC | PRN
  Administered 2024-03-07 (×2): 50 ug via INTRAVENOUS

## 2024-03-07 MED ORDER — OXYCODONE HCL 5 MG PO TABS
ORAL_TABLET | ORAL | Status: AC
  Filled 2024-03-07: qty 1

## 2024-03-07 MED ORDER — OXYCODONE HCL 5 MG/5ML PO SOLN: 5.0000 mg | Freq: Once | ORAL | Status: AC | PRN

## 2024-03-07 MED ORDER — CEFAZOLIN SODIUM-DEXTROSE 2-4 GM/100ML-% IV SOLN
2.0000 g | INTRAVENOUS | Status: AC
Start: 2024-03-07 — End: 2024-03-07
  Administered 2024-03-07: 2 g via INTRAVENOUS
  Filled 2024-03-07: qty 100

## 2024-03-07 MED ORDER — FENTANYL CITRATE (PF) 100 MCG/2ML IJ SOLN
INTRAMUSCULAR | Status: AC
Start: 2024-03-07 — End: ?
  Filled 2024-03-07: qty 2

## 2024-03-07 MED ORDER — ORAL CARE MOUTH RINSE: 15.0000 mL | Freq: Once | OROMUCOSAL | Status: AC

## 2024-03-07 MED ORDER — BUPIVACAINE-EPINEPHRINE (PF) 0.25% -1:200000 IJ SOLN
INTRAMUSCULAR | Status: AC
  Filled 2024-03-07: qty 30

## 2024-03-07 MED ORDER — FENTANYL CITRATE (PF) 250 MCG/5ML IJ SOLN
INTRAMUSCULAR | Status: AC
  Filled 2024-03-07: qty 5

## 2024-03-07 MED ORDER — CELECOXIB 200 MG PO CAPS
200.0000 mg | ORAL_CAPSULE | ORAL | Status: AC
  Administered 2024-03-07: 200 mg via ORAL
  Filled 2024-03-07: qty 1

## 2024-03-07 MED ORDER — STERILE WATER FOR IRRIGATION IR SOLN
Status: DC | PRN
  Administered 2024-03-07: 1000 mL

## 2024-03-07 MED ORDER — ACETAMINOPHEN 500 MG PO TABS: 1000.0000 mg | ORAL_TABLET | Freq: Once | ORAL | Status: DC

## 2024-03-07 MED ORDER — ONDANSETRON HCL 4 MG/2ML IJ SOLN
INTRAMUSCULAR | Status: DC | PRN
  Administered 2024-03-07: 4 mg via INTRAVENOUS

## 2024-03-07 MED ORDER — BUPIVACAINE HCL 0.25 % IJ SOLN: INTRAMUSCULAR | Status: DC | PRN

## 2024-03-07 MED ORDER — OXYCODONE HCL 5 MG PO TABS: 5.0000 mg | ORAL_TABLET | Freq: Four times a day (QID) | ORAL | 0 refills | Status: AC | PRN

## 2024-03-07 MED ORDER — LIDOCAINE HCL (CARDIAC) PF 100 MG/5ML IV SOSY
PREFILLED_SYRINGE | INTRAVENOUS | Status: DC | PRN
  Administered 2024-03-07: 80 mg via INTRAVENOUS

## 2024-03-07 MED ORDER — BUPIVACAINE LIPOSOME 1.3 % IJ SUSP
INTRAMUSCULAR | Status: AC
  Filled 2024-03-07: qty 20

## 2024-03-07 MED ORDER — ROCURONIUM BROMIDE 10 MG/ML (PF) SYRINGE
PREFILLED_SYRINGE | INTRAVENOUS | Status: DC | PRN
  Administered 2024-03-07: 50 mg via INTRAVENOUS
  Administered 2024-03-07: 20 mg via INTRAVENOUS

## 2024-03-07 MED ORDER — DEXAMETHASONE SODIUM PHOSPHATE 10 MG/ML IJ SOLN
INTRAMUSCULAR | Status: DC | PRN
Start: 2024-03-07 — End: 2024-03-07
  Administered 2024-03-07: 10 mg via INTRAVENOUS

## 2024-03-07 MED ORDER — DEXAMETHASONE SODIUM PHOSPHATE 10 MG/ML IJ SOLN
INTRAMUSCULAR | Status: AC
  Filled 2024-03-07: qty 1

## 2024-03-07 MED ORDER — ROCURONIUM BROMIDE 10 MG/ML (PF) SYRINGE
PREFILLED_SYRINGE | INTRAVENOUS | Status: AC
  Filled 2024-03-07: qty 10

## 2024-03-07 MED ORDER — LIDOCAINE 2% (20 MG/ML) 5 ML SYRINGE
INTRAMUSCULAR | Status: AC
  Filled 2024-03-07: qty 5

## 2024-03-07 MED ORDER — MIDAZOLAM HCL 2 MG/2ML IJ SOLN
INTRAMUSCULAR | Status: AC
  Filled 2024-03-07: qty 2

## 2024-03-07 MED ORDER — FENTANYL CITRATE (PF) 250 MCG/5ML IJ SOLN
INTRAMUSCULAR | Status: DC | PRN
  Administered 2024-03-07 (×2): 100 ug via INTRAVENOUS
  Administered 2024-03-07: 50 ug via INTRAVENOUS

## 2024-03-07 SURGICAL SUPPLY — 45 items
BAG COUNTER SPONGE SURGICOUNT (BAG) IMPLANT
CAUTERY HOOK MNPLR 1.6 DVNC XI (INSTRUMENTS) ×1 IMPLANT
CHLORAPREP W/TINT 26 (MISCELLANEOUS) ×1 IMPLANT
CLIP APPLIE 5 13 M/L LIGAMAX5 (MISCELLANEOUS) ×1 IMPLANT
CLIP LIGATING HEM O LOK PURPLE (MISCELLANEOUS) ×1 IMPLANT
CLIP LIGATING HEMO O LOK GREEN (MISCELLANEOUS) IMPLANT
COVER MAYO STAND STRL (DRAPES) ×1 IMPLANT
COVER SURGICAL LIGHT HANDLE (MISCELLANEOUS) ×1 IMPLANT
COVER TIP SHEARS 8 DVNC (MISCELLANEOUS) ×1 IMPLANT
DERMABOND ADVANCED .7 DNX12 (GAUZE/BANDAGES/DRESSINGS) ×1 IMPLANT
DERMABOND ADVANCED .7 DNX6 (GAUZE/BANDAGES/DRESSINGS) IMPLANT
DRAPE ARM DVNC X/XI (DISPOSABLE) ×4 IMPLANT
DRAPE COLUMN DVNC XI (DISPOSABLE) ×1 IMPLANT
DRAPE CV SPLIT W-CLR ANES SCRN (DRAPES) ×1 IMPLANT
DRAPE SURG ORHT 6 SPLT 77X108 (DRAPES) ×1 IMPLANT
FORCEPS BPLR FENES DVNC XI (FORCEP) ×1 IMPLANT
FORCEPS PROGRASP DVNC XI (FORCEP) IMPLANT
GLOVE BIO SURGEON STRL SZ7.5 (GLOVE) ×2 IMPLANT
GLOVE SURG ENC MOIS LTX SZ6 (GLOVE) ×2 IMPLANT
GLOVE SURG MICRO LTX SZ6 (GLOVE) ×2 IMPLANT
GLOVE SURG UNDER LTX SZ6.5 (GLOVE) ×2 IMPLANT
GOWN SPEC L3 XXLG W/TWL (GOWN DISPOSABLE) ×1 IMPLANT
GOWN STRL REUS W/ TWL LRG LVL3 (GOWN DISPOSABLE) ×4 IMPLANT
GRASPER SUT TROCAR 14GX15 (MISCELLANEOUS) IMPLANT
GRASPER TIP-UP FEN DVNC XI (INSTRUMENTS) ×1 IMPLANT
IRRIGATION SUCT STRKRFLW 2 WTP (MISCELLANEOUS) IMPLANT
KIT BASIN OR (CUSTOM PROCEDURE TRAY) ×1 IMPLANT
KIT TURNOVER KIT B (KITS) IMPLANT
MARKER SKIN DUAL TIP RULER LAB (MISCELLANEOUS) ×1 IMPLANT
NDL INSUFFLATION 14GA 120MM (NEEDLE) ×1 IMPLANT
NEEDLE INSUFFLATION 14GA 120MM (NEEDLE) ×1 IMPLANT
OBTURATOR OPTICALSTD 8 DVNC (TROCAR) IMPLANT
PENCIL SMOKE EVACUATOR (MISCELLANEOUS) IMPLANT
POUCH RETRIEVAL ECOSAC 10 (ENDOMECHANICALS) IMPLANT
SCISSORS LAP 5X35 DISP (ENDOMECHANICALS) IMPLANT
SCISSORS MNPLR CVD DVNC XI (INSTRUMENTS) ×1 IMPLANT
SEAL UNIV 5-12 XI (MISCELLANEOUS) ×4 IMPLANT
SEALER VESSEL EXT DVNC XI (MISCELLANEOUS) IMPLANT
SET TUBE SMOKE EVAC HIGH FLOW (TUBING) ×1 IMPLANT
SPIKE FLUID TRANSFER (MISCELLANEOUS) ×1 IMPLANT
STOPCOCK 4 WAY LG BORE MALE ST (IV SETS) ×1 IMPLANT
SUT MNCRL AB 4-0 PS2 18 (SUTURE) ×1 IMPLANT
SUT VICRYL 0 UR6 27IN ABS (SUTURE) IMPLANT
TOWEL GREEN STERILE (TOWEL DISPOSABLE) ×1 IMPLANT
TRAY LAPAROSCOPIC MC (CUSTOM PROCEDURE TRAY) ×1 IMPLANT

## 2024-03-07 NOTE — Interval H&P Note (Signed)
 History and Physical Interval Note:  03/07/2024 10:34 AM  Donald Merritt  has presented today for surgery, with the diagnosis of BILIARY DYSKNESIA.  The various methods of treatment have been discussed with the patient and family. After consideration of risks, benefits and other options for treatment, the patient has consented to  Procedure(s): CHOLECYSTECTOMY, ROBOT-ASSISTED, LAPAROSCOPIC (N/A) as a surgical intervention.  The patient's history has been reviewed, patient examined, no change in status, stable for surgery.  I have reviewed the patient's chart and labs.  Questions were answered to the patient's satisfaction.     Lujean Sake

## 2024-03-07 NOTE — Telephone Encounter (Signed)
 Last OV was on 12/20/23.  Last filled on 07/16/23 #10 tabs/ 3 refills

## 2024-03-07 NOTE — Anesthesia Postprocedure Evaluation (Signed)
 Anesthesia Post Note  Patient: Donald Merritt  Procedure(s) Performed: CHOLECYSTECTOMY, ROBOT-ASSISTED, LAPAROSCOPIC     Patient location during evaluation: PACU Anesthesia Type: General Level of consciousness: awake and alert Pain management: pain level controlled Vital Signs Assessment: post-procedure vital signs reviewed and stable Respiratory status: spontaneous breathing, nonlabored ventilation, respiratory function stable and patient connected to nasal cannula oxygen Cardiovascular status: blood pressure returned to baseline and stable Postop Assessment: no apparent nausea or vomiting Anesthetic complications: no  No notable events documented.  Last Vitals:  Vitals:   03/07/24 1500 03/07/24 1515  BP: (!) 153/102 (!) 151/93  Pulse: 69 78  Resp: 14 17  Temp:  36.6 C  SpO2: 97% 98%    Last Pain:  Vitals:   03/07/24 1517  TempSrc:   PainSc: 4                  Trindon Dorton L Ameisha Mcclellan

## 2024-03-07 NOTE — Discharge Instructions (Addendum)
 [facility] DISCHARGE INSTRUCTIONS  Activity No heavy lifting greater than 15 pounds for 4 weeks after surgery. Ok to shower in 24 hours, but do not bathe or submerge incisions underwater. Do not drive while taking narcotic pain medication. You may drive when you are no longer taking prescription pain medication, you can comfortably wear a seatbelt, and you can safely maneuver your car and apply brakes.  Wound Care Your incisions are covered with skin glue called Dermabond. This will peel off on its own over time. You may shower and allow warm soapy water to run over your incisions. Gently pat dry. Do not submerge your incision underwater until cleared by your surgeon. Monitor your incision for any new redness, tenderness, or drainage. Many patients will experience some swelling and bruising at the incisions.  Ice packs will help.  Swelling and bruising can take several days to resolve.   Medications A  prescription for pain medication may be given to you upon discharge.  Take your pain medication as prescribed, if needed.  If narcotic pain medicine is not needed, then you may take acetaminophen  (Tylenol ) or ibuprofen  (Advil ) as needed. It is common to experience some constipation if taking pain medication after surgery.  Increasing fluid intake and taking a stool softener (such as Colace) will usually help or prevent this problem from occurring.  A mild laxative (Milk of Magnesia or Miralax) should be taken according to package directions if there are no bowel movements after 48 hours. Take your usually prescribed medications unless otherwise directed. If you need a refill on your pain medication, please contact your pharmacy.  They will contact our office to request authorization. Prescriptions will not be filled after 5 pm or on weekends.  When to Call Us : Fever greater than 100.5 New redness, drainage, or swelling at incision site Severe pain, nausea, or  vomiting Persistent bleeding from incisions Jaundice (yellowing of the whites of the eyes or skin)  Follow-up You have an appointment scheduled with Dr. Leighton Punches on April 03, 2024 at 3:10pm. This will be at the [facility] office at [address], [place], [place]. Please arrive at least 15 minutes prior to your scheduled appointment time.  IF YOU HAVE DISABILITY OR FAMILY LEAVE FORMS, YOU MUST BRING THEM TO THE OFFICE FOR PROCESSING.   DO NOT GIVE THEM TO YOUR DOCTOR.  The clinic staff is available to answer your questions during regular business hours.  Please don't hesitate to call and ask to speak to one of the nurses for clinical concerns.  If you have a medical emergency, go to the nearest emergency room or call 911.  A surgeon from [facility] is always on call at the hospital  [address], [address], [place], [place]  [address] ?  [address] [address], [place], [place]   [address] [phone] ? Toll Free: [phone] ? FAX [phone] Web site: [email or web address]      Managing Your Pain After Surgery Without Opioids    Thank you for participating in our program to help patients manage their pain after surgery without opioids. This is part of our effort to provide you with the best care possible, without exposing you or your family to the risk that opioids pose.  What pain can I expect after surgery? You can expect to have some pain after surgery. This is normal. The pain is typically worse the day after surgery, and quickly begins to get better. Many studies have  found that many patients are able to manage their pain after surgery with Over-the-Counter (OTC) medications such as Tylenol  and Motrin . If you have a condition that does not allow you to take Tylenol  or Motrin , notify your surgical team.  How will I manage my pain? The best strategy for controlling your pain after surgery is around the clock pain control with  Tylenol  (acetaminophen ) and Motrin  (ibuprofen  or Advil ). Alternating these medications with each other allows you to maximize your pain control. In addition to Tylenol  and Motrin , you can use heating pads or ice packs on your incisions to help reduce your pain.  How will I alternate your regular strength over-the-counter pain medication? You will take a dose of pain medication every three hours. Start by taking 650 mg of Tylenol  (2 pills of 325 mg) 3 hours later take 600 mg of Motrin  (3 pills of 200 mg) 3 hours after taking the Motrin  take 650 mg of Tylenol  3 hours after that take 600 mg of Motrin .   - 1 -  See example - if your first dose of Tylenol  is at 12:00 PM   12:00 PM Tylenol  650 mg (2 pills of 325 mg)  3:00 PM Motrin  600 mg (3 pills of 200 mg)  6:00 PM Tylenol  650 mg (2 pills of 325 mg)  9:00 PM Motrin  600 mg (3 pills of 200 mg)  Continue alternating every 3 hours   We recommend that you follow this schedule around-the-clock for at least 3 days after surgery, or until you feel that it is no longer needed. Use the table on the last page of this handout to keep track of the medications you are taking. Important: Do not take more than 3000mg  of Tylenol  or 3200mg  of Motrin  in a 24-hour period. Do not take ibuprofen /Motrin  if you have a history of bleeding stomach ulcers, severe kidney disease, &/or actively taking a blood thinner  What if I still have pain? If you have pain that is not controlled with the over-the-counter pain medications (Tylenol  and Motrin  or Advil ) you might have what we call "breakthrough" pain. You will receive a prescription for a small amount of an opioid pain medication such as Oxycodone , Tramadol , or Tylenol  with Codeine . Use these opioid pills in the first 24 hours after surgery if you have breakthrough pain. Do not take more than 1 pill every 4-6 hours.  If you still have uncontrolled pain after using all opioid pills, don't hesitate to call our staff  using the number provided. We will help make sure you are managing your pain in the best way possible, and if necessary, we can provide a prescription for additional pain medication.   Day 1    Time  Name of Medication Number of pills taken  Amount of Acetaminophen   Pain Level   Comments  AM PM       AM PM       AM PM       AM PM       AM PM       AM PM       AM PM       AM PM       Total Daily amount of Acetaminophen  Do not take more than  3,000 mg per day      Day 2    Time  Name of Medication Number of pills taken  Amount of Acetaminophen   Pain Level   Comments  AM PM  AM PM       AM PM       AM PM       AM PM       AM PM       AM PM       AM PM       Total Daily amount of Acetaminophen  Do not take more than  3,000 mg per day      Day 3    Time  Name of Medication Number of pills taken  Amount of Acetaminophen   Pain Level   Comments  AM PM       AM PM       AM PM       AM PM         AM PM       AM PM       AM PM       AM PM       Total Daily amount of Acetaminophen  Do not take more than  3,000 mg per day      Day 4    Time  Name of Medication Number of pills taken  Amount of Acetaminophen   Pain Level   Comments  AM PM       AM PM       AM PM       AM PM       AM PM       AM PM       AM PM       AM PM       Total Daily amount of Acetaminophen  Do not take more than  3,000 mg per day      Day 5    Time  Name of Medication Number of pills taken  Amount of Acetaminophen   Pain Level   Comments  AM PM       AM PM       AM PM       AM PM       AM PM       AM PM       AM PM       AM PM       Total Daily amount of Acetaminophen  Do not take more than  3,000 mg per day      Day 6    Time  Name of Medication Number of pills taken  Amount of Acetaminophen   Pain Level  Comments  AM PM       AM PM       AM PM       AM PM       AM PM       AM PM       AM PM       AM PM       Total Daily amount of  Acetaminophen  Do not take more than  3,000 mg per day      Day 7    Time  Name of Medication Number of pills taken  Amount of Acetaminophen   Pain Level   Comments  AM PM       AM PM       AM PM       AM PM       AM PM       AM PM       AM PM       AM PM  Total Daily amount of Acetaminophen  Do not take more than  3,000 mg per day        For additional information about how and where to safely dispose of unused opioid medications - [email or web address]  Disclaimer: This document contains information and/or instructional materials adapted from [facility] for the typical patient with your condition. It does not replace medical advice from your health care provider because your experience may differ from that of the typical patient. Talk to your health care provider if you have any questions about this document, your condition or your treatment plan. Adapted from [facility]

## 2024-03-07 NOTE — Anesthesia Procedure Notes (Signed)
 Procedure Name: Intubation Date/Time: 03/07/2024 12:46 PM  Performed by: Grier Leber, CRNAPre-anesthesia Checklist: Patient identified, Emergency Drugs available, Suction available and Patient being monitored Patient Re-evaluated:Patient Re-evaluated prior to induction Oxygen Delivery Method: Circle System Utilized Preoxygenation: Pre-oxygenation with 100% oxygen Induction Type: IV induction Ventilation: Mask ventilation without difficulty and Oral airway inserted - appropriate to patient size Laryngoscope Size: Mac and 4 Grade View: Grade I Tube type: Oral Tube size: 7.5 mm Number of attempts: 1 Airway Equipment and Method: Stylet and Oral airway Placement Confirmation: ETT inserted through vocal cords under direct vision, positive ETCO2 and breath sounds checked- equal and bilateral Secured at: 22 cm Tube secured with: Tape Dental Injury: Teeth and Oropharynx as per pre-operative assessment

## 2024-03-07 NOTE — Anesthesia Preprocedure Evaluation (Addendum)
 Anesthesia Evaluation  Patient identified by MRN, date of birth, ID band Patient awake    Reviewed: Allergy & Precautions, NPO status , Patient's Chart, lab work & pertinent test results, reviewed documented beta blocker date and time   Airway Mallampati: II  TM Distance: >3 FB Neck ROM: Full    Dental  (+) Edentulous Upper, Dental Advisory Given   Pulmonary sleep apnea (not compliant with CPAP) , Patient abstained from smoking., former smoker   Pulmonary exam normal breath sounds clear to auscultation       Cardiovascular hypertension, Pt. on home beta blockers and Pt. on medications Normal cardiovascular exam Rhythm:Regular Rate:Normal     Neuro/Psych  Headaches PSYCHIATRIC DISORDERS Anxiety Depression       GI/Hepatic Neg liver ROS,GERD  Controlled and Medicated,,  Endo/Other  negative endocrine ROS    Renal/GU negative Renal ROS  negative genitourinary   Musculoskeletal negative musculoskeletal ROS (+)    Abdominal   Peds  Hematology negative hematology ROS (+)   Anesthesia Other Findings   Reproductive/Obstetrics                             Anesthesia Physical Anesthesia Plan  ASA: 3  Anesthesia Plan: General   Post-op Pain Management: Tylenol  PO (pre-op)*   Induction: Intravenous  PONV Risk Score and Plan: 2 and Midazolam, Dexamethasone and Ondansetron   Airway Management Planned: Oral ETT  Additional Equipment:   Intra-op Plan:   Post-operative Plan: Extubation in OR  Informed Consent: I have reviewed the patients History and Physical, chart, labs and discussed the procedure including the risks, benefits and alternatives for the proposed anesthesia with the patient or authorized representative who has indicated his/her understanding and acceptance.     Dental advisory given  Plan Discussed with: CRNA  Anesthesia Plan Comments: (2 IVs)       Anesthesia Quick  Evaluation

## 2024-03-07 NOTE — Op Note (Signed)
 Date: 03/07/24  Patient: Donald Merritt MRN: [id number]  Preoperative Diagnosis: Biliary dyskinesia Postoperative Diagnosis: Same  Procedure: Robotic-assisted laparoscopic cholecystectomy  Surgeon: Karleen Overall, MD Assistant: Woodroe Hazel, RNFA  EBL: Minimal  Anesthesia: General endotracheal  Specimens: Gallbladder  Indications: Donald Merritt is a 38 yo male who was referred with postprandial RUQ pain, nausea and bloating. CT scan and US  did not show any gallstones, however a HIDA showed a significantly reduced EF of 10%. After a discussion of the risks and benefits of surgery, he agreed to proceed with cholecystectomy.  Findings: Evidence of chronic cholecystitis, no signs of acute cholecystitis.  Procedure details: Informed consent was obtained in the preoperative area prior to the procedure. The patient was brought to the operating room and placed on the table in the supine position. General anesthesia was induced and appropriate lines and drains were placed for intraoperative monitoring. Perioperative antibiotics were administered per SCIP guidelines. The abomden was prepped and draped in the usual sterile fashion. A pre-procedure timeout was taken verifying patient identity, surgical site and procedure to be performed.  A small supraumbilical skin incision was made, the umbilical stalk was grasped and elevated, and a Veress needle was inserted through the fascia. Intraperitoneal placement was confirmed with the saline drop test, the abdomen was insufflated, and an 8mm Visiport was placed. The peritoneal cavity was inspected with no evidence of visceral or vascular injury. Two 8mm ports were placed in the right mid-abdomen in line with the initial port, under direct visualization. An additional 8mm port was placed in the left mid-abdomen. The robot was docked to the patient. The fundus of the gallbladder was grasped and retracted cephalad. There were omental adhesions to the gallbladder,  which were taken down using blunt dissection and cautery. The infundibulum was retracted laterally. The peritoneum overlying the gallbladder was incised and the cystic triangle was dissected out using cautery and blunt dissection. The cystic triangle was cleared of fatty tissue, and the lower third of the gallbladder was taken off the liver using cautery. The critical view of safety was obtained. The cystic duct and cystic artery were clipped and divided, leaving two clips behind on the cystic duct stump. The gallbladder was taken off the liver using cautery. Hemostasis was achieved in the gallbladder fossa using cautery. The cystic duct and artery stumps were visually inspected and there was no evidence of bile leak or bleeding. The robot was undocked. The abdomen was irrigated with sterile saline, and the surgical site appeared hemostatic. The specimen was placed in an Ecosac and extracted via the umbilical port site. The umbilical port site fascia was closed with a 0 Vicryl suture using a PMI. The remaining ports were removed and the abdomen was desufflated. The skin at all port sites was closed with 4-0 monocryl subcuticular suture. Dermabond was applied.   The patient tolerated the procedure well with no apparent complications. All counts were correct x2 at the end of the procedure. The patient was extubated and taken to PACU in stable condition.  Karleen Overall, MD 03/07/24 2:15 PM

## 2024-03-07 NOTE — Transfer of Care (Signed)
 Immediate Anesthesia Transfer of Care Note  Patient: Donald Merritt  Procedure(s) Performed: CHOLECYSTECTOMY, ROBOT-ASSISTED, LAPAROSCOPIC  Patient Location: PACU  Anesthesia Type:General  Level of Consciousness: awake, alert , oriented, drowsy, and patient cooperative  Airway & Oxygen Therapy: Patient Spontanous Breathing and Patient connected to nasal cannula oxygen  Post-op Assessment: Report given to RN and Post -op Vital signs reviewed and stable  Post vital signs: Reviewed and stable  Last Vitals:  Vitals Value Taken Time  BP 165/116 03/07/24 1423  Temp 36.3 C 03/07/24 1423  Pulse 72 03/07/24 1425  Resp 15 03/07/24 1425  SpO2 100 % 03/07/24 1425  Vitals shown include unfiled device data.  Last Pain:  Vitals:   03/07/24 1106  TempSrc:   PainSc: 4          Complications: No notable events documented.

## 2024-03-08 LAB — SURGICAL PATHOLOGY

## 2024-04-16 NOTE — Progress Notes (Unsigned)
 [facility] Return Visit   Referring Provider Tower, Laine LABOR, MD [address] [place],  [place] [address]  Primary Care Provider Tower, Laine LABOR, MD  Patient Profile: Donald Merritt is a 38 y.o. male who returns to the [facility] for follow-up of the problem(s) noted below.  Problem List: GERD Nausea RUQ abdominal pain IBS-D History of colon polyps -sessile serrated polyps and tubular adenomas Hepatic steatosis, elevated hepatic transminases   History of Present Illness   Donald Merritt was last seen in the GI office 02/15/2024   Current GI Meds  Famotidine  20 mg p.o. twice daily Pantoprazole  40 mg p.o. twice daily Glycopyrrolate  2 mg p.o. twice daily Hyoscyamine  as needed Ondansetron  as needed -states he does not think he has this medication Peppermint oil Loperamide Hyoscyamine    Interval History  History at the time of Donald Merritt last clinic visit 02/15/2024 included:  Subsequent to Donald Merritt last clinic visit he underwent EGD/colonoscopy 12/06/2023  EGD - endoscopically normal Colonoscopy- two polyps 4-7 mm (TA), o/w normal colon and TI Pathology ruled out H. pylori infection, celiac disease, IBD and microscopic colitis  He returns to the office today reporting that his symptoms of GERD, nausea, right upper quadrant abdominal pain and diarrhea are no better Last Sunday experienced a severe bout of nausea, vomiting and diarrhea that precluded his ability to go to work Continues to experience bouts of sharp and cramping right upper quadrant abdominal pain Does not feel that antispasmodics such as glycopyrrolate  make a difference He inquires about having his gallbladder removed and queries about an updated HIDA scan-last one was negative in 2022 Advised that diarrhea could worsen status post cholecystectomy  Diarrhea is loose and watery in nature Has not been able to identify any specific food triggers Endorses gas and  bloating Recently took Kaopectate to coat his stomach and thinks that this helped alleviate some abdominal pain and diarrhea Discussed rounding out his diarrhea evaluation with an alpha gal test Also reviewed possibility of SIBO Can potentially consider Viberzi in the future   Has a history of migraine headaches for which he takes rizatriptan  as needed Currently on gabapentin  300 mg p.o. twice daily as well -this does not seem to ameliorate his GI symptoms Has never been on amitriptyline in the past  Spent a length of time reviewing how his GI symptoms have been negatively impacting both his personal life and occupational life  Imaging has also showed evidence of fatty liver No history of jaundice or chronic hepatitis Has not had formal serologic workup for chronic hepatitides and discussed performing this today  Interval history: After Donald Merritt last clinic visit a HIDA scan was performed that showed a reduced gallbladder ejection fraction of 10% consistent with biliary dyskinesia Donald Merritt underwent cholecystectomy 03/07/2024 -pathology consistent with biliary dyskinesia   Last colonoscopy: 12/06/2023 - Two polyps 4-7 mm (TA), o/w normal colon and TI Last endoscopy:  12/06/2023 - endoscopically normal  Last Abd CT/CTE/MRE: 09/2023 - no acute abnormality, fatty liver  GI Review of Symptoms Significant for abdominal pain, nausea, vomiting, diarrhea. Otherwise negative.  General Review of Systems  Review of systems is significant for the pertinent positives and negatives as listed per the HPI.  Full ROS is otherwise negative.  Past Medical History   Past Medical History:  Diagnosis Date   Allergy    Depression    Environmental allergies    GERD (gastroesophageal reflux disease)    Hypertension    Other and unspecified noninfectious  gastroenteritis and colitis(558.9)    Other malaise and fatigue    Panic attack    Renal stones    Tobacco use disorder    Unspecified sleep apnea       Past Surgical History   Past Surgical History:  Procedure Laterality Date   neg hx       Allergies and Medications   Allergies  Allergen Reactions   Chantix [Varenicline] Shortness Of Breath   Betadine [Povidone Iodine] Rash    No outpatient medications have been marked as taking for the 04/17/24 encounter (Appointment) with Suzann Inocente HERO, MD.     Family History   Family History  Problem Relation Age of Onset   Asthma Mother    Hypertension Father    Heart disease Paternal Grandmother    Diabetes Paternal Grandmother    Lactose intolerance Daughter    Bone cancer Paternal Grandfather    Colon cancer Neg Hx    Esophageal cancer Neg Hx    Pancreatic cancer Neg Hx    Stomach cancer Neg Hx    Kidney disease Neg Hx    Liver disease Neg Hx     Social History   Social History   Tobacco Use   Smoking status: Former    Current packs/day: 0.00    Average packs/day: 1 pack/day for 15.0 years (15.0 ttl pk-yrs)    Types: Cigarettes    Start date: 09/04/2005    Quit date: 09/04/2020    Years since quitting: 3.6   Smokeless tobacco: Former    Types: Snuff    Quit date: 10/20/2011  Vaping Use   Vaping status: Every Day   Substances: Nicotine   Substance Use Topics   Alcohol use: Not Currently    Alcohol/week: 0.0 standard drinks of alcohol   Drug use: No   Donald Merritt reports that he quit smoking about 3 years ago. His smoking use included cigarettes. He started smoking about 18 years ago. He has a 15 pack-year smoking history. He quit smokeless tobacco use about 12 years ago.  His smokeless tobacco use included snuff. He reports that he does not currently use alcohol. He reports that he does not use drugs.  Vital Signs and Physical Examination   There were no vitals filed for this visit.  There is no height or weight on file to calculate BMI.    General: Well developed, well nourished, no acute distress Head: Normocephalic and atraumatic Eyes: Sclerae  anicteric, EOMI Lungs: Clear throughout to auscultation Heart: Regular rate and rhythm; No murmurs, rubs or bruits Abdomen: Soft, tender over right upper quadrant and mid abdomen, non distended. No masses, hepatosplenomegaly or hernias noted. Normal Bowel sounds Rectal: Deferred Musculoskeletal: Symmetrical with no gross deformities     Review of Data  The following data was reviewed at the time of this encounter:  Laboratory Studies      Latest Ref Rng & Units 03/02/2024    2:30 PM 11/26/2023    7:46 AM 11/05/2023    3:49 PM  CBC  WBC 4.0 - 10.5 K/uL 12.1  11.4  10.3   Hemoglobin 13.0 - 17.0 g/dL 82.5  83.1  82.9   Hematocrit 39.0 - 52.0 % 50.0  49.9  49.1   Platelets 150 - 400 K/uL 229  229.0  257.0     Lab Results  Component Value Date   LIPASE 12.0 09/29/2023      Latest Ref Rng & Units 03/02/2024    2:30 PM 11/26/2023  7:46 AM 11/05/2023    3:49 PM  CMP  Glucose 70 - 99 mg/dL 92  93  89   BUN 6 - 20 mg/dL 5  10  10    Creatinine 0.61 - 1.24 mg/dL 8.97  8.88  8.88   Sodium 135 - 145 mmol/L 139  142  139   Potassium 3.5 - 5.1 mmol/L 4.4  4.6  4.1   Chloride 98 - 111 mmol/L 103  104  102   CO2 22 - 32 mmol/L 27  27  30    Calcium 8.9 - 10.3 mg/dL 9.4  9.4  9.8   Total Protein 6.0 - 8.3 g/dL  7.1  7.3   Total Bilirubin 0.2 - 1.2 mg/dL  0.7  0.6   Alkaline Phos 39 - 117 U/L  97  93   AST 0 - 37 U/L  57  27   ALT 0 - 53 U/L  195  75    Lab Results  Component Value Date   TSH 1.50 02/15/2024    Imaging Studies  HIDA 03/07/2024 Prompt uptake and biliary excretion of activity by the liver is seen. Gallbladder activity is visualized, consistent with patency of cystic duct. Biliary activity passes into small bowel, consistent with patent common bile duct.   Calculated gallbladder ejection fraction is 10%. (Normal gallbladder ejection fraction with Ensure is greater than 33% and less than 80%.)   IMPRESSION: Reduced gallbladder ejection fraction as can be seen with  chronic cholecystitis/biliary dyskinesia.  RUQ US  11/11/2023 1. No cholelithiasis or sonographic evidence for acute cholecystitis. 2. Increased hepatic parenchymal echogenicity suggestive of steatosis.  CTAP 09/29/2023 1. No acute intra-abdominal or pelvic pathology. 2. Fatty liver.  CTAP  09/30/2022 No acute findings in the abdomen or pelvis. Specifically, no findings to explain the patient's history of right lower quadrant pain.  HIDA 11/12/2020 Prompt uptake and biliary excretion of activity by the liver is seen. Gallbladder activity is visualized, consistent with patency of cystic duct. Biliary activity passes into small bowel, consistent with patent common bile duct.   Calculated gallbladder ejection fraction is 63%. (Normal gallbladder ejection fraction with Ensure is greater than 33%.)   IMPRESSION: Normal hepatobiliary scan, demonstrating patency of cystic and common bile ducts.   Normal gallbladder ejection fraction.   AUS 10/10/2020 Probable fatty infiltration of liver as above. Incomplete pancreatic visualization. No acute upper abdominal sonographic abnormalities identified.  CTAP 08/06/2020 No explanation for abdominal pain.     GI Procedures and Studies  EGD/Colonoscopy 12/06/2023 EGD - endoscopically normal Colonoscopy - two polyps 4-7 mm (TA), o/w normal colon and TI Path: Peptic duodenitis, normal gastric, ileal and colon tissue-no microscopic colitis  EGD/Colonoscopy 07/29/2018 EGD - mild patchy erythema in antrum o/w normal Colonoscopy - 6 mm cecal polyps, normal colon and TI Path: Sessile serrated polyps, benign gastric mucosa  EGD 03/15/2013 Mild gastritis, small HH Path: Benign gastric mucosa with scant inflammation, no H. pylori  Clinical Impression  It is my clinical impression that Donald Merritt is a 38 y.o. male with;  GERD Nausea RUQ abdominal pain IBS-D History of colon polyps -sessile serrated polyps and tubular adenomas Hepatic  steatosis, elevated hepatic transaminases  Donald Merritt returns to the office for follow-up of GERD, nausea, right upper quadrant abdominal pain, IBS-D and hepatic steatosis.  He has had a lengthy evaluation over the years including laboratory studies, abdominal ultrasound, CTAP, HIDA scan, EGD and colonoscopy which have all essentially been unremarkable.  We discussed that his symptoms are  most likely consistent with a form of IBS-D.  That being said other diagnoses to consider could include alpha gal allergy, SIBO, bile acid diarrhea.  He inquires about having his gallbladder removed.  We reviewed that without an indication such as biliary colic or biliary dyskinesia cholecystectomy would not be advisable.  It may worsen his diarrhea.  He is interested in an updated HIDA scan and we will proceed with scheduling this.  I have also recommended rounding out his laboratory evaluation with an alpha gal test and an empiric course of Flagyl  for possible SIBO.  Can consider possibility of cholestyramine/Colestid or Viberzi in the future.  He has not noted relief of pain with antispasmodics or low-dose gabapentin .  Could consider trial of amitriptyline in the future.  His imaging studies have demonstrated evidence of hepatic steatosis over the years and labs are noteworthy for elevated hepatic transaminases.  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  Plan  Labs today: Alpha 1 antitrypsin, GGT, HCV antibody, hepatitis B studies, ANA, CK, IgG, alpha gal panel, vitamin B12, folate, iron, ferritin, TSH, anti-smooth muscle antibody, ceruloplasmin Schedule HIDA CCK Empiric course of metronidazole  250 mg p.o. 3 times daily x 14 days-if ineffective can consider trial of rifaximin for IBS-D Prescription refill provided for ondansetron  4 mg p.o. every 8 hours as needed for nausea and vomiting Continue pantoprazole   40 mg p.o. twice daily and famotidine  20 mg p.o. twice daily; consider addition of baclofen if there is significant regurgitation Other alternate therapies to consider outlined above-cholestyramine/Colestid, Viberzi, amitriptyline, Kaopectate Surveillance colonoscopy due February 2032  Planned Follow Up 2-3 months  The patient or caregiver verbalized understanding of the material covered, with no barriers to understanding. All questions were answered. Patient or caregiver is agreeable with the plan outlined above.    It was a pleasure to see Donald Merritt.  If you have any questions or concerns regarding this evaluation, do not hesitate to contact me.  Inocente Hausen, MD [facility]   I spent total of 45 minutes in both face-to-face (25 minutes interview) and non-face-to-face (25 minutes chart review, care coordination, documentation) activities, excluding procedures performed, for the visit on the date of this encounter.

## 2024-04-17 ENCOUNTER — Encounter: Payer: Self-pay | Admitting: Pediatrics

## 2024-04-17 ENCOUNTER — Ambulatory Visit: Admitting: Pediatrics

## 2024-04-17 VITALS — BP 130/80 | HR 79 | Ht 69.0 in | Wt 221.0 lb

## 2024-04-17 DIAGNOSIS — K589 Irritable bowel syndrome without diarrhea: Secondary | ICD-10-CM

## 2024-04-17 DIAGNOSIS — K219 Gastro-esophageal reflux disease without esophagitis: Secondary | ICD-10-CM

## 2024-04-17 DIAGNOSIS — Z91014 Allergy to mammalian meats: Secondary | ICD-10-CM

## 2024-04-17 DIAGNOSIS — Z860101 Personal history of adenomatous and serrated colon polyps: Secondary | ICD-10-CM

## 2024-04-17 DIAGNOSIS — K760 Fatty (change of) liver, not elsewhere classified: Secondary | ICD-10-CM

## 2024-04-17 DIAGNOSIS — K580 Irritable bowel syndrome with diarrhea: Secondary | ICD-10-CM | POA: Diagnosis not present

## 2024-04-17 DIAGNOSIS — K828 Other specified diseases of gallbladder: Secondary | ICD-10-CM | POA: Diagnosis not present

## 2024-04-17 NOTE — Patient Instructions (Addendum)
 We are referring you to [facility].  They will contact you directly to schedule an appointment.  It may take a week or more before you hear from them.  Please feel free to contact us  if you have not heard from them within 2 weeks and we will follow up on the referral.   Follow up in 6 months.  Thank you for entrusting me with your care and for choosing [facility], Dr. Inocente Hausen  _______________________________________________________  If your blood pressure at your visit was 140/90 or greater, please contact your primary care physician to follow up on this.  _______________________________________________________  If you are age 39 or older, your body mass index should be between 23-30. Your Body mass index is 32.64 kg/m. If this is out of the aforementioned range listed, please consider follow up with your Primary Care Provider.  If you are age 60 or younger, your body mass index should be between 19-25. Your Body mass index is 32.64 kg/m. If this is out of the aformentioned range listed, please consider follow up with your Primary Care Provider.   ________________________________________________________  The [facility] providers would like to encourage you to use MYCHART to communicate with providers for non-urgent requests or questions.  Due to long hold times on the telephone, sending your provider a message by [facility] may be a faster and more efficient way to get a response.  Please allow 48 business hours for a response.  Please remember that this is for non-urgent requests.  _______________________________________________________

## 2024-05-16 ENCOUNTER — Ambulatory Visit: Admitting: Family Medicine

## 2024-05-22 ENCOUNTER — Encounter: Payer: Self-pay | Admitting: Family Medicine

## 2024-05-22 ENCOUNTER — Ambulatory Visit: Admitting: Family Medicine

## 2024-05-22 VITALS — BP 136/88 | HR 96 | Temp 97.7°F | Ht 69.0 in | Wt 222.0 lb

## 2024-05-22 DIAGNOSIS — M79602 Pain in left arm: Secondary | ICD-10-CM | POA: Diagnosis not present

## 2024-05-22 MED ORDER — PREDNISONE 10 MG PO TABS
ORAL_TABLET | ORAL | 0 refills | Status: AC
Start: 2024-05-22 — End: ?

## 2024-05-22 NOTE — Progress Notes (Signed)
 Subjective:    Patient ID: Donald Merritt, male    DOB: 22-Aug-1986, 38 y.o.   MRN: [id number]  HPI  Wt Readings from Last 3 Encounters:  05/22/24 222 lb (100.7 kg)  04/17/24 221 lb (100.2 kg)  03/07/24 210 lb (95.3 kg)   32.78 kg/m  Vitals:   05/22/24 1410 05/22/24 1441  BP: (!) 144/86 136/88  Pulse: 96   Temp: 97.7 F (36.5 C)   SpO2: 98%     Pt presents for left shoulder /arm and hand pain   Started since mid July  Pain from shoulder all the way down to hand (index finger and thumb) No injury or trauma known Has a little heavy lifting   Pain is achy At times sharp  Feels like spasm at times  Sensation is off in tip of thumb and index finger   Neck does not hurt  Area between neck and shoulder bothers him  Elbow -lateral   Worse with  Driving  Sitting   Better with  Reaching above his head  Gripping at time -not bad enough to drop    Heating pad-helps some  Bio freeze  Took an old muscle relaxer -did not help?        Patient Active Problem List   Diagnosis Date Noted   Left arm pain 05/22/2024   Acute laryngitis 01/11/2024   Vitamin D  deficiency 12/03/2023   Diabetes mellitus screening 11/24/2023   Current use of proton pump inhibitor 11/24/2023   Periumbilical abdominal pain 09/29/2023   Obesity (BMI 30-39.9) 08/29/2023   Hepatic steatosis 08/08/2023   Elevated transaminase level 08/06/2023   Elevated blood pressure reading 08/06/2023   Other headache syndrome 09/09/2021   Routine general medical examination at a health care facility 04/19/2020   Chronic headaches 04/17/2019   IBS (irritable bowel syndrome) 01/04/2017   Former smoker 07/22/2015   Hypertriglyceridemia 03/13/2014   GERD (gastroesophageal reflux disease) 09/04/2011   History of kidney stones 03/12/2010   HYPERSOMNIA, PERSISTENT 03/15/2008   Allergic rhinitis 02/13/2008   Depressed mood 04/27/2007   Past Medical History:  Diagnosis Date   Allergy    Depression     Environmental allergies    GERD (gastroesophageal reflux disease)    Hypertension    Other and unspecified noninfectious gastroenteritis and colitis(558.9)    Other malaise and fatigue    Panic attack    Renal stones    Tobacco use disorder    Unspecified sleep apnea    Past Surgical History:  Procedure Laterality Date   neg hx     Social History   Tobacco Use   Smoking status: Former    Current packs/day: 0.00    Average packs/day: 1 pack/day for 15.0 years (15.0 ttl pk-yrs)    Types: Cigarettes    Start date: 09/04/2005    Quit date: 09/04/2020    Years since quitting: 3.7   Smokeless tobacco: Former    Types: Snuff    Quit date: 10/20/2011  Vaping Use   Vaping status: Every Day   Substances: Nicotine   Substance Use Topics   Alcohol use: Not Currently    Alcohol/week: 0.0 standard drinks of alcohol   Drug use: No   Family History  Problem Relation Age of Onset   Asthma Mother    Hypertension Father    Heart disease Paternal Grandmother    Diabetes Paternal Grandmother    Lactose intolerance Daughter    Bone cancer Paternal Grandfather    Colon  cancer Neg Hx    Esophageal cancer Neg Hx    Pancreatic cancer Neg Hx    Stomach cancer Neg Hx    Kidney disease Neg Hx    Liver disease Neg Hx    Allergies  Allergen Reactions   Chantix [Varenicline] Shortness Of Breath   Betadine [Povidone Iodine] Rash   Current Outpatient Medications on File Prior to Visit  Medication Sig Dispense Refill   famotidine  (PEPCID ) 20 MG tablet Take 1 tablet (20 mg total) by mouth 2 (two) times daily. 180 tablet 1   fluticasone  (FLONASE ) 50 MCG/ACT nasal spray Place 1 spray into both nostrils daily as needed for allergies or rhinitis.     gabapentin  (NEURONTIN ) 300 MG capsule Take 1 capsule (300 mg total) by mouth 2 (two) times daily. (Patient taking differently: Take 300 mg by mouth 3 (three) times daily.) 180 capsule 2   glycopyrrolate  (ROBINUL ) 2 MG tablet TAKE 1 TABLET (2 MG TOTAL)  BY MOUTH 2 (TWO) TIMES DAILY. AS NEEDED FOR ABDOMINAL PAIN (Patient taking differently: Take 2 mg by mouth 2 (two) times daily.) 180 tablet 1   hyoscyamine  (LEVSIN /SL) 0.125 MG SL tablet Place 2 tablets (0.25 mg total) under the tongue 4 (four) times daily as needed for cramping. 30 tablet 1   levocetirizine (XYZAL) 5 MG tablet Take 5 mg by mouth every evening.     loperamide (IMODIUM A-D) 2 MG tablet Take 2 mg by mouth as needed for diarrhea or loose stools.     metoprolol  succinate (TOPROL -XL) 25 MG 24 hr tablet Take 1 tablet (25 mg total) by mouth daily. With food 90 tablet 3   ondansetron  (ZOFRAN -ODT) 4 MG disintegrating tablet Take 1 tablet (4 mg total) by mouth every 8 (eight) hours as needed for nausea or vomiting. 60 tablet 0   pantoprazole  (PROTONIX ) 40 MG tablet Take 1 tablet (40 mg total) by mouth 2 (two) times daily. 180 tablet 3   PEPPERMINT OIL PO Take 1 Dose by mouth as needed (Digestion/stomach pain).     rizatriptan  (MAXALT -MLT) 10 MG disintegrating tablet TAKE 1 TABLET BY MOUTH AS NEEDED FOR MIGRAINE. MAY REPEAT IN 2 HOURS IF NEEDED 10 tablet 3   No current facility-administered medications on file prior to visit.    Review of Systems  Constitutional:  Negative for activity change, appetite change, fatigue, fever and unexpected weight change.  HENT:  Negative for congestion, rhinorrhea, sore throat and trouble swallowing.   Eyes:  Negative for pain, redness, itching and visual disturbance.  Respiratory:  Negative for cough, chest tightness, shortness of breath and wheezing.   Cardiovascular:  Negative for chest pain and palpitations.  Gastrointestinal:  Negative for abdominal pain, blood in stool, constipation, diarrhea and nausea.       Abdominal symptoms are improved after ccy   Endocrine: Negative for cold intolerance, heat intolerance, polydipsia and polyuria.  Genitourinary:  Negative for difficulty urinating, dysuria, frequency and urgency.  Musculoskeletal:  Positive  for arthralgias and myalgias. Negative for joint swelling.  Skin:  Negative for pallor and rash.  Neurological:  Negative for dizziness, tremors, weakness, numbness and headaches.       Occational tingling in fingers   Hematological:  Negative for adenopathy. Does not bruise/bleed easily.  Psychiatric/Behavioral:  Negative for decreased concentration and dysphoric mood. The patient is not nervous/anxious.        Objective:   Physical Exam Constitutional:      General: He is not in acute distress.  Appearance: Normal appearance. He is well-developed. He is obese. He is not ill-appearing or diaphoretic.  HENT:     Head: Normocephalic and atraumatic.     Mouth/Throat:     Mouth: Mucous membranes are moist.  Eyes:     Conjunctiva/sclera: Conjunctivae normal.     Pupils: Pupils are equal, round, and reactive to light.  Neck:     Thyroid : No thyromegaly.     Vascular: No carotid bruit or JVD.  Cardiovascular:     Rate and Rhythm: Normal rate and regular rhythm.     Heart sounds: Normal heart sounds.     No gallop.  Pulmonary:     Effort: Pulmonary effort is normal. No respiratory distress.     Breath sounds: Normal breath sounds. No wheezing or rales.  Abdominal:     General: There is no distension or abdominal bruit.     Palpations: Abdomen is soft.  Musculoskeletal:     Left shoulder: Tenderness present. No swelling, deformity, effusion, bony tenderness or crepitus. Normal range of motion. Normal strength. Normal pulse.     Left upper arm: Tenderness present. No swelling or bony tenderness.     Left elbow: No swelling, deformity or effusion. Normal range of motion. Tenderness present in lateral epicondyle and olecranon process.     Left forearm: Tenderness present. No swelling or bony tenderness.     Left wrist: No swelling, deformity, snuff box tenderness or crepitus.     Left hand: Tenderness present. No swelling or deformity. Normal strength. Normal capillary refill. Normal  pulse.     Cervical back: Normal range of motion and neck supple.     Right lower leg: No edema.     Left lower leg: No edema.     Comments: Left shoulder  Tender in trap area  Pain with int/ext rotation but rom is good as is strength Mildly painful hawking test  Mildly tender acromion   No CS tenderness/normal rom Some tenderness of left trap and also just medial to scapula    Lymphadenopathy:     Cervical: No cervical adenopathy.  Skin:    General: Skin is warm and dry.     Coloration: Skin is not jaundiced or pale.     Findings: No bruising, erythema or rash.  Neurological:     Mental Status: He is alert.     Motor: No weakness.     Coordination: Coordination normal.     Deep Tendon Reflexes: Reflexes are normal and symmetric. Reflexes normal.     Comments: Neg tinel and phalen tests of left wrist  Normal grip (some discomfort)   Psychiatric:        Mood and Affect: Mood normal.     Comments: Mildly anxious            Assessment & Plan:   Problem List Items Addressed This Visit       Other   Left arm pain - Primary   Puzzling presentation re: origin  Pain in left trap area/ shoulder/ lateral elbow and 1,2nd fingers (occational tingling)  No trauma or instigating event  Fairly good rom of shoulder on exam Tender Lateral epicondyle  Neg tinel/phalen  Tender somewhat in soft tissues of upper/lower arm   Prescribed prednisone  40 mg taper and discussed side effects Will increase gabapentin  to 300 mg tid (was bid) with caution of sedation  Try ice on elbow  Try heat on trap   Consider sport med eval/ perhaps PT if not  improved or worse

## 2024-05-22 NOTE — Assessment & Plan Note (Signed)
 Puzzling presentation re: origin  Pain in left trap area/ shoulder/ lateral elbow and 1,2nd fingers (occational tingling)  No trauma or instigating event  Fairly good rom of shoulder on exam Tender Lateral epicondyle  Neg tinel/phalen  Tender somewhat in soft tissues of upper/lower arm   Prescribed prednisone  40 mg taper and discussed side effects Will increase gabapentin  to 300 mg tid (was bid) with caution of sedation  Try ice on elbow  Try heat on trap   Consider sport med eval/ perhaps PT if not improved or worse

## 2024-05-22 NOTE — Patient Instructions (Addendum)
 Try heat at the area between neck and shoulder  Try ice at elbow  All for 10 minutes at a time   Take the prednisone  as directed (it may make you feel hyper and hungry)  For now -go up on gabapentin  to 300 mg three times daily (am , mid day, bed time)  This should help pain Watch out for sedation or dizziness (keep us  posted)   Update us  if not improving in the next week

## 2024-08-23 ENCOUNTER — Other Ambulatory Visit: Payer: Self-pay | Admitting: Family Medicine

## 2024-08-23 NOTE — Telephone Encounter (Signed)
 Last OV was on 05/22/24 or an acute appt.,  Gabapentin  last filled on 08/27/23 #180 caps/ 2 refills   Pepcid  last filled on 12/03/23 #180 tab/ 1 refill

## 2024-09-10 ENCOUNTER — Other Ambulatory Visit: Payer: Self-pay | Admitting: Family Medicine
